# Patient Record
Sex: Female | Born: 1970 | Race: Asian | Hispanic: No | Marital: Married | Smoking: Never smoker
Health system: Southern US, Community
[De-identification: ages and names within clinical notes are randomized; demographics above are authoritative.]

## PROBLEM LIST (undated history)

## (undated) DIAGNOSIS — R0600 Dyspnea, unspecified: Secondary | ICD-10-CM

## (undated) DIAGNOSIS — R002 Palpitations: Secondary | ICD-10-CM

## (undated) DIAGNOSIS — F32A Depression, unspecified: Secondary | ICD-10-CM

## (undated) DIAGNOSIS — T7840XA Allergy, unspecified, initial encounter: Secondary | ICD-10-CM

## (undated) DIAGNOSIS — K769 Liver disease, unspecified: Secondary | ICD-10-CM

## (undated) DIAGNOSIS — K5140 Inflammatory polyps of colon without complications: Secondary | ICD-10-CM

## (undated) DIAGNOSIS — E039 Hypothyroidism, unspecified: Secondary | ICD-10-CM

## (undated) DIAGNOSIS — I10 Essential (primary) hypertension: Secondary | ICD-10-CM

## (undated) DIAGNOSIS — I728 Aneurysm of other specified arteries: Secondary | ICD-10-CM

## (undated) DIAGNOSIS — I77810 Thoracic aortic ectasia: Secondary | ICD-10-CM

## (undated) DIAGNOSIS — I499 Cardiac arrhythmia, unspecified: Secondary | ICD-10-CM

## (undated) DIAGNOSIS — R7303 Prediabetes: Secondary | ICD-10-CM

## (undated) DIAGNOSIS — E785 Hyperlipidemia, unspecified: Secondary | ICD-10-CM

## (undated) DIAGNOSIS — Z8632 Personal history of gestational diabetes: Secondary | ICD-10-CM

## (undated) DIAGNOSIS — F329 Major depressive disorder, single episode, unspecified: Secondary | ICD-10-CM

## (undated) DIAGNOSIS — G4733 Obstructive sleep apnea (adult) (pediatric): Secondary | ICD-10-CM

## (undated) HISTORY — DX: Hypothyroidism, unspecified: E03.9

## (undated) HISTORY — DX: Inflammatory polyps of colon without complications: K51.40

## (undated) HISTORY — DX: Palpitations: R00.2

## (undated) HISTORY — DX: Essential (primary) hypertension: I10

## (undated) HISTORY — DX: Depression, unspecified: F32.A

## (undated) HISTORY — DX: Liver disease, unspecified: K76.9

## (undated) HISTORY — DX: Major depressive disorder, single episode, unspecified: F32.9

## (undated) HISTORY — DX: Obstructive sleep apnea (adult) (pediatric): G47.33

## (undated) HISTORY — DX: Hyperlipidemia, unspecified: E78.5

## (undated) HISTORY — DX: Thoracic aortic ectasia: I77.810

## (undated) HISTORY — DX: Allergy, unspecified, initial encounter: T78.40XA

## (undated) HISTORY — DX: Aneurysm of other specified arteries: I72.8

## (undated) HISTORY — PX: FOOT SURGERY: SHX648

---
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
## 1996-05-18 HISTORY — PX: WISDOM TOOTH EXTRACTION: SHX21

## 2003-04-27 ENCOUNTER — Encounter: Admission: RE | Admit: 2003-04-27 | Discharge: 2003-04-27 | Payer: Self-pay | Admitting: Obstetrics and Gynecology

## 2003-07-08 ENCOUNTER — Inpatient Hospital Stay (HOSPITAL_COMMUNITY): Admission: AD | Admit: 2003-07-08 | Discharge: 2003-07-10 | Payer: Self-pay | Admitting: Obstetrics and Gynecology

## 2003-07-11 ENCOUNTER — Encounter: Admission: RE | Admit: 2003-07-11 | Discharge: 2003-08-10 | Payer: Self-pay | Admitting: Obstetrics and Gynecology

## 2003-12-25 ENCOUNTER — Other Ambulatory Visit: Admission: RE | Admit: 2003-12-25 | Discharge: 2003-12-25 | Payer: Self-pay | Admitting: Obstetrics and Gynecology

## 2005-02-03 ENCOUNTER — Other Ambulatory Visit: Admission: RE | Admit: 2005-02-03 | Discharge: 2005-02-03 | Payer: Self-pay | Admitting: Obstetrics and Gynecology

## 2005-08-16 ENCOUNTER — Inpatient Hospital Stay (HOSPITAL_COMMUNITY): Admission: AD | Admit: 2005-08-16 | Discharge: 2005-08-18 | Payer: Self-pay | Admitting: Obstetrics and Gynecology

## 2005-10-31 ENCOUNTER — Inpatient Hospital Stay (HOSPITAL_COMMUNITY): Admission: AD | Admit: 2005-10-31 | Discharge: 2005-10-31 | Payer: Self-pay | Admitting: Obstetrics and Gynecology

## 2006-02-10 ENCOUNTER — Other Ambulatory Visit: Admission: RE | Admit: 2006-02-10 | Discharge: 2006-02-10 | Payer: Self-pay | Admitting: Obstetrics and Gynecology

## 2008-09-25 ENCOUNTER — Emergency Department (HOSPITAL_COMMUNITY): Admission: EM | Admit: 2008-09-25 | Discharge: 2008-09-25 | Payer: Self-pay | Admitting: Emergency Medicine

## 2008-09-28 ENCOUNTER — Encounter (HOSPITAL_COMMUNITY): Admission: RE | Admit: 2008-09-28 | Discharge: 2008-12-27 | Payer: Self-pay | Admitting: Emergency Medicine

## 2009-05-24 ENCOUNTER — Inpatient Hospital Stay (HOSPITAL_COMMUNITY): Admission: AD | Admit: 2009-05-24 | Discharge: 2009-05-24 | Payer: Self-pay | Admitting: Obstetrics and Gynecology

## 2009-07-09 ENCOUNTER — Encounter: Admission: RE | Admit: 2009-07-09 | Discharge: 2009-07-09 | Payer: Self-pay | Admitting: Obstetrics and Gynecology

## 2009-08-16 ENCOUNTER — Inpatient Hospital Stay (HOSPITAL_COMMUNITY): Admission: AD | Admit: 2009-08-16 | Discharge: 2009-08-18 | Payer: Self-pay | Admitting: Obstetrics and Gynecology

## 2010-06-08 ENCOUNTER — Encounter: Payer: Self-pay | Admitting: Obstetrics and Gynecology

## 2010-08-03 LAB — WET PREP, GENITAL
Trich, Wet Prep: NONE SEEN
Yeast Wet Prep HPF POC: NONE SEEN

## 2010-08-03 LAB — GC/CHLAMYDIA PROBE AMP, GENITAL
Chlamydia, DNA Probe: NEGATIVE
GC Probe Amp, Genital: NEGATIVE

## 2010-08-06 LAB — CBC
Hemoglobin: 11.4 g/dL — ABNORMAL LOW (ref 12.0–15.0)
MCHC: 34 g/dL (ref 30.0–36.0)
MCHC: 34.7 g/dL (ref 30.0–36.0)
MCV: 92.7 fL (ref 78.0–100.0)
MCV: 93.5 fL (ref 78.0–100.0)
Platelets: 148 10*3/uL — ABNORMAL LOW (ref 150–400)
Platelets: 183 10*3/uL (ref 150–400)
RBC: 3.52 MIL/uL — ABNORMAL LOW (ref 3.87–5.11)
RDW: 14.1 % (ref 11.5–15.5)
WBC: 10.3 10*3/uL (ref 4.0–10.5)

## 2010-10-03 NOTE — H&P (Signed)
NAMEDORELLA, LASTER                           ACCOUNT NO.:  [id number]   MEDICAL RECORD NO.:  [id number]                   PATIENT TYPE:  INP   LOCATION:  9169                                 [facility]ILITY:  [facility]   PHYSICIAN:  Crist Fat. Rivard, M.D.              DATE OF BIRTH:  [birth year]   DATE OF ADMISSION:  07/08/2003  DATE OF DISCHARGE:                                HISTORY & PHYSICAL   REASON FOR ADMISSION:  The patient is a 40 year old Gravida II, Para 0-0-1-0  who presents at 43 and 4/7 weeks in early active labor.  EDD is July 04, 2003 by dates confirmed with ultrasound.  Her contractions began at 2300 on  July 07, 2003 and were irregular throughout the night.  Her contractions  have become strong and regular since 6 o'clock this a.m.  She reports  positive fetal movement, no bleeding and no ruptured of membranes.  No PIH  symptoms.  No headache or visual changes.  No epigastric pain.   PREGNANCY HISTORY:  Her pregnancy has been followed by the M.D. service at  [facility] and is remarkable for:  1. Transfer of care at 25 weeks from [place].  2. History of depression.  3. Gestational diabetes.  4. Positive Group B Strep; she is PENICILLIN allergic.  5. This patient was initially evaluated at the office of [facility] on March 22, 2003.  She transferred care from [place] at 25 weeks.  Her pregnancy has     been essentially unremarkable though complicated with sciatic pain for     which she was seen at [facility].  Some mild third trimester bleeding.     Cervical polyp noted.  Vaginal yeast infection for which she was     medicated with Terazol.  At 28 weeks one hour glucose challenge was     elevated.  Three hour GGT found values of 78, 187, 190 and 128 making the     patient a gestational diabetic.  She received nutritional counseling,     monitored blood sugars with diet and results of fasting blood sugar on     all two hour p.c.'s have been within normal limits.  She has been  size     equal to dates throughout, normotensive with no proteinuria.  On June 01, 2002 laboratory work results show a hemoglobin and hematocrit of 14.5     and 42.0, platelets 278,000.  Blood type and Rh are O positive. Antibody     screen negative.  VDRL was non-reactive.  Rubella was immune.  Hepatitis     B surface antigen was negative.  HIV was non-reactive.  Pap smear was     within normal limits.  Chlamydia negative.  CF testing declined.  Clot     screen declined.  As stated above at 28 weeks, one hour glucose challenge  was elevated.  Three hour GGT was elevated making the patient gestational     diabetic.  She has been diet controlled. At 36 weeks, culture of the     vaginal tract was positive for Group B Strep.   PAST MEDICAL HISTORY:  1. Acid reflux.  2. History of urinary tract infections.  3. History of depression for which she received short term treatment in the     past.  4. Sledding accident and broke her femur at age 23.  5. Wisdom teeth removed in 1999.  6. Gum graft surgery in 2004.   GENETIC HISTORY:  The patient's sisters have scoliosis, otherwise there is  no remarkable genetic history.   ALLERGIES:  1. PENICILLIN which causes a rash.  2. TOPICAL BENADRYL which causes a rash.   HABITS:  She denies the use of tobacco, alcohol or illicit drugs.   OB HISTORY:  In 2004 the patient had a first trimester SAB with  no  complications.   SOCIAL HISTORY:  The patient is a 40 year old Asian American female.  Her  husband, Alleya Demeter, is involved and supportive.  They are [place] in  their faith.   REVIEW OF SYMPTOMS:  There are no signs or symptoms suggestive of focal or  systemic disease.  The patient is typical of one with a uterine pregnancy at  term in early active labor.   PHYSICAL EXAMINATION:  VITAL SIGNS:  Stable and she is afebrile.  HEENT:  Unremarkable.  HEART:  Regular rate and rhythm.  LUNGS:  Clear.  ABDOMEN:  Gravid in its  contour.  The uterine fundus is noted to extend 38  cm above the level of the pubic symphysis.  Lay pulse finds the infant to be  in longitudinal lie and cephalic presentation.  The estimated fetal weight  is 7.5 pounds.  The baseline of the fetal heart rate monitor is 160 with  positive variability and positive accelerations.  The fetal heart rate is  reactive with occasional variables noted.  The patient is contracting  regularly every two to three minutes.  CERVICAL EXAM:  Her cervix is 4 cm dilated, 90% effaced with the cephalic  presenting part at a -1 station.  EXTREMITIES:  No pathologic edema.  Deep tendon reflexes are 1+ with no  clonus.   ASSESSMENT:  Intrauterine pregnancy at term, early active labor.   PLAN:  1. Admit per Dr. Dois Davenport Rivard.  2. May have epidural.  3. Begin clindamycin IV for positive Group B Strep prophylaxis.     Rica Koyanagi, C.N.M.               Crist Fat Rivard, M.D.    SDM/MEDQ  D:  07/08/2003  T:  07/08/2003  Job:  [id number]

## 2010-10-03 NOTE — H&P (Signed)
Ann Bass, Ann Bass               ACCOUNT NO.:  [id number]   MEDICAL RECORD NO.:  [id number]          PATIENT TYPE:  INP   LOCATION:  9111                          [facility]ILITY:  [facility]   PHYSICIAN:  Osborn Coho, M.D.   DATE OF BIRTH:  [birth year]   DATE OF ADMISSION:  08/16/2005  DATE OF DISCHARGE:                                HISTORY & PHYSICAL   HISTORY OF PRESENT ILLNESS:  This is a 40 year old, G3, P1-0-1-1 at 38-1/7  weeks who presents with contractions x1 hour.  She denies leaking, bleeding  and reports positive fetal movement.  Pregnancy has been followed by Dr.  Estanislado Pandy and is remarkable for unsure LMP, history of depression, history of  depression, history of gestational diabetes last pregnancy, Group B  Streptococcus positive with penicillin allergy.   PAST OBSTETRICAL HISTORY:  Vaginal delivery in 2005, of a female infant at  14 weeks' gestation weighing 7 pounds 6 ounces, remarkable for NICU stay  secondary to low 1 minute Apgar's at 3, 6 and 7.   PAST MEDICAL HISTORY:  1.  Diet-controlled gestational diabetes with first pregnancy.  2.  History of childhood varicella.  3.  History of reflux.  4.  History of depression for which she takes no medications.   PAST SURGICAL HISTORY:  1.  Wisdom teeth in 1999.  2.  Gum graft in 2004.   FAMILY HISTORY:  Remarkable for grandmother with ovarian cancer.   GENETIC HISTORY:  Unremarkable.   SOCIAL HISTORY:  The patient is married to [facility], who is involved and  supportive.  She is of [place] faith.  She works as a Proofreader.  She denies any alcohol, tobacco or drug use.   ALLERGIES:  PENICILLIN causes a rash.  BENADRYL causes a rash.   PRENATAL LABORATORY DATA:  Hemoglobin 14.4, platelets 293, blood type O  positive, antibody screen negative.  Sickle cell negative.  RPR nonreactive,  rubella immune.  Hepatitis negative, HIV negative.  Pap smear normal.  Gonorrhea negative.  Chlamydia negative.  Hemoglobin  electrophoresis normal.   PRENATAL COURSE:  The patient entered care at 10 weeks' gestation.  She had  an ultrasound in the first trimester which was normal.  She had a Glucola at  20 weeks which was 160.  She had another ultrasound at that time which was  normal.  A 3-hour GTT was done and had a single abnormal value.  She had  some sleep disturbance in mid pregnancy.  She had another 3-hour GTT at 29  weeks which was normal and ultrasound at 36 weeks which showed 45% growth  and normal AFI and was Group B Streptococcus positive at term.   PHYSICAL EXAMINATION:  VITAL SIGNS:  Stable.  Afebrile.  HEENT:  Within normal limits.  NECK:  Thyroid normal, not enlarged.  CHEST:  Clear to auscultation.  HEART:  Regular rate and rhythm.  ABDOMEN:  Gravid at 38 cm.  Vertex to [place].  EFM shows reactive fetal  heart rate with contractions every 2 minutes.  PELVIC:  Cervix is 5-6 cm per R.N. who checked patient prior  to calling me.   ASSESSMENT:  1.  Intrauterine pregnancy at 38-1/7 weeks.  2.  Active labor.  3.  Group B Streptococcus positive.  4.  Penicillin allergic.   PLAN:  1.  Admit to birthing suites per Dr. Su Hilt.  2.  Routine M.D. orders.  3.  Sensitivities were not done and we will therefore use vancomycin      prophylaxis.  4.  Epidural p.r.n.      Shakti L. Williams, C.N.M.      Osborn Coho, M.D.  Electronically Signed    MLW/MEDQ  D:  08/16/2005  T:  08/16/2005  Job:  [id number]

## 2011-07-08 ENCOUNTER — Other Ambulatory Visit: Payer: Self-pay | Admitting: Obstetrics and Gynecology

## 2011-07-08 DIAGNOSIS — Z1231 Encounter for screening mammogram for malignant neoplasm of breast: Secondary | ICD-10-CM

## 2011-07-13 ENCOUNTER — Ambulatory Visit
Admission: RE | Admit: 2011-07-13 | Discharge: 2011-07-13 | Disposition: A | Discharge status: Home / Self Care | Payer: BC Managed Care – PPO | Source: Ambulatory Visit | Attending: Obstetrics and Gynecology | Admitting: Obstetrics and Gynecology

## 2011-07-13 DIAGNOSIS — Z1231 Encounter for screening mammogram for malignant neoplasm of breast: Secondary | ICD-10-CM

## 2012-07-14 ENCOUNTER — Telehealth: Payer: Self-pay

## 2012-07-14 MED ORDER — DROSPIRENONE-ETHINYL ESTRADIOL 3-0.03 MG PO TABS: 1.0000 | ORAL_TABLET | Freq: Every day | ORAL | Status: DC

## 2012-07-14 NOTE — Telephone Encounter (Signed)
Received rx refill from [facility] pharmacy for ocella. TC to pt apt scheduled for aex 08/18/2012 with SR @ 9:50 rx sent to last until aex  Darien Ramus, CMA

## 2012-08-18 LAB — HM PAP SMEAR: HM Pap smear: NORMAL

## 2012-09-29 ENCOUNTER — Other Ambulatory Visit: Payer: Self-pay

## 2012-09-29 DIAGNOSIS — Z1231 Encounter for screening mammogram for malignant neoplasm of breast: Secondary | ICD-10-CM

## 2012-11-02 ENCOUNTER — Ambulatory Visit
Admission: RE | Admit: 2012-11-02 | Discharge: 2012-11-02 | Disposition: A | Discharge status: Home / Self Care | Payer: BC Managed Care – PPO | Source: Ambulatory Visit

## 2012-11-02 DIAGNOSIS — Z1231 Encounter for screening mammogram for malignant neoplasm of breast: Secondary | ICD-10-CM

## 2013-02-21 ENCOUNTER — Telehealth: Payer: Self-pay

## 2013-02-21 NOTE — Telephone Encounter (Signed)
LM for CB  HM reviewed. Needs HM information and any records available Due as noted:

## 2013-02-22 ENCOUNTER — Encounter: Payer: Self-pay | Admitting: Family Medicine

## 2013-02-22 ENCOUNTER — Ambulatory Visit (INDEPENDENT_AMBULATORY_CARE_PROVIDER_SITE_OTHER): Payer: BC Managed Care – PPO | Admitting: Family Medicine

## 2013-02-22 VITALS — BP 126/80 | HR 79 | Temp 98.0°F | Resp 16 | Ht 62.0 in | Wt 138.1 lb

## 2013-02-22 DIAGNOSIS — E039 Hypothyroidism, unspecified: Secondary | ICD-10-CM | POA: Insufficient documentation

## 2013-02-22 DIAGNOSIS — R7302 Impaired glucose tolerance (oral): Secondary | ICD-10-CM | POA: Insufficient documentation

## 2013-02-22 DIAGNOSIS — F32A Depression, unspecified: Secondary | ICD-10-CM | POA: Insufficient documentation

## 2013-02-22 DIAGNOSIS — R7309 Other abnormal glucose: Secondary | ICD-10-CM

## 2013-02-22 DIAGNOSIS — F3289 Other specified depressive episodes: Secondary | ICD-10-CM

## 2013-02-22 DIAGNOSIS — R5381 Other malaise: Secondary | ICD-10-CM | POA: Insufficient documentation

## 2013-02-22 DIAGNOSIS — E049 Nontoxic goiter, unspecified: Secondary | ICD-10-CM

## 2013-02-22 DIAGNOSIS — E010 Iodine-deficiency related diffuse (endemic) goiter: Secondary | ICD-10-CM

## 2013-02-22 DIAGNOSIS — F329 Major depressive disorder, single episode, unspecified: Secondary | ICD-10-CM | POA: Insufficient documentation

## 2013-02-22 NOTE — Progress Notes (Signed)
  Subjective:    Patient ID: Ann Bass, female    DOB: Mar 01, 1971, 42 y.o.   MRN: [id number]  HPI New to establish.  Previous MD- [place] ([facility])  GYN- Rivard  Depression- on Zoloft, feels this is working 'really well'.  Denies tearfulness.  Admits to quick to anger.  Not sleeping well- waking frequently.  Not withdrawing from people or activities.  Glucose intolerance- pt reports she was 'borderline' for all 3 pregnancies.  Mom has diabetes.  Pt reports she has 'sensitivities to certain foods'- pt notes that unopposed carb intake will result in a 'crash' by mid morning/afternoon/evening.  Fatigue- this is what prompted pt to see integrative medicine.  Wakes in the morning 'already thinking about going back to bed'.  'can't make it through the day w/out lying down'.  Will turn down plans due to fatigue.  Had thyroid US and blood work yesterday.   Review of Systems For ROS see HPI     Objective:   Physical Exam  Constitutional: She is oriented to person, place, and time. She appears well-developed and well-nourished. No distress.  HENT:  Head: Normocephalic and atraumatic.  Eyes: Conjunctivae and EOM are normal. Pupils are equal, round, and reactive to light.  Neck: Normal range of motion. Neck supple. Thyromegaly present.  Cardiovascular: Normal rate, regular rhythm, normal heart sounds and intact distal pulses.   No murmur heard. Pulmonary/Chest: Effort normal and breath sounds normal. No respiratory distress.  Abdominal: Soft. She exhibits no distension. There is no tenderness.  Musculoskeletal: She exhibits no edema.  Lymphadenopathy:    She has no cervical adenopathy.  Neurological: She is alert and oriented to person, place, and time.  Skin: Skin is warm and dry.  Psychiatric: She has a normal mood and affect. Her behavior is normal.          Assessment & Plan:

## 2013-02-22 NOTE — Telephone Encounter (Signed)
Unable to reach prior to visit  

## 2013-02-22 NOTE — Patient Instructions (Signed)
Schedule your complete physical in 6 months We'll notify you of your lab results and make any changes if needed If all labs come back normal, we may want to think about switching the Zoloft Call with any questions or concerns Please send copies of labs and thyroid US Think of [place] as your home base! Happy [place] Birthday!

## 2013-02-23 LAB — HEMOGLOBIN A1C: Hgb A1c MFr Bld: 5.6 % (ref 4.6–6.5)

## 2013-02-26 NOTE — Assessment & Plan Note (Signed)
New to provider.  Ongoing for pt.  Feels Zoloft is controlling sxs.  Discussed that Zoloft may be contributing to pt's ongoing fatigue.  Not interested in making changes at this time.

## 2013-02-26 NOTE — Assessment & Plan Note (Signed)
New.  Pt would like to have A1C done due to her hx of glucose intolerance w/ all 3 pregnancies.  Reviewed importance of low carb diet and regular exercise.  Will follow.

## 2013-02-26 NOTE — Assessment & Plan Note (Signed)
New to provider, ongoing for pt.  Reports she is seeing [facility] for this and had lab work done.  Asked for copies.  Discussed that her Zoloft may be contributing to sxs.  Will follow.

## 2013-02-26 NOTE — Assessment & Plan Note (Signed)
New to provider.  Pt reports she had thyroid US and lab work done yesterday.  Encouraged her to fax copy for review and to be included in her chart.  Will follow.

## 2013-08-16 LAB — HM PAP SMEAR: HM Pap smear: NORMAL

## 2013-12-16 LAB — HM MAMMOGRAPHY: HM Mammogram: NORMAL

## 2013-12-20 ENCOUNTER — Other Ambulatory Visit: Payer: Self-pay

## 2013-12-20 DIAGNOSIS — Z1231 Encounter for screening mammogram for malignant neoplasm of breast: Secondary | ICD-10-CM

## 2013-12-26 ENCOUNTER — Ambulatory Visit
Admission: RE | Admit: 2013-12-26 | Discharge: 2013-12-26 | Disposition: A | Discharge status: Home / Self Care | Payer: BC Managed Care – PPO | Source: Ambulatory Visit

## 2013-12-26 DIAGNOSIS — Z1231 Encounter for screening mammogram for malignant neoplasm of breast: Secondary | ICD-10-CM

## 2014-05-25 ENCOUNTER — Ambulatory Visit (INDEPENDENT_AMBULATORY_CARE_PROVIDER_SITE_OTHER): Payer: BLUE CROSS/BLUE SHIELD | Admitting: Family Medicine

## 2014-05-25 ENCOUNTER — Ambulatory Visit (INDEPENDENT_AMBULATORY_CARE_PROVIDER_SITE_OTHER): Payer: BLUE CROSS/BLUE SHIELD | Admitting: General Practice

## 2014-05-25 ENCOUNTER — Encounter: Payer: Self-pay | Admitting: Family Medicine

## 2014-05-25 VITALS — BP 124/84 | HR 83 | Temp 98.1°F | Resp 16 | Wt 138.2 lb

## 2014-05-25 DIAGNOSIS — F329 Major depressive disorder, single episode, unspecified: Secondary | ICD-10-CM

## 2014-05-25 DIAGNOSIS — F32A Depression, unspecified: Secondary | ICD-10-CM

## 2014-05-25 DIAGNOSIS — Z23 Encounter for immunization: Secondary | ICD-10-CM

## 2014-05-25 MED ORDER — SERTRALINE HCL 50 MG PO TABS: 50.0000 mg | ORAL_TABLET | Freq: Every day | ORAL | Status: DC

## 2014-05-25 NOTE — Progress Notes (Signed)
Pre visit review using our clinic review tool, if applicable. No additional management support is needed unless otherwise documented below in the visit note. 

## 2014-05-25 NOTE — Progress Notes (Signed)
   Subjective:    Patient ID: Ann Bass, female    DOB: Jul 26, 1970, 44 y.o.   MRN: [id number]  HPI Depression- chronic problem.  Pt attempted to wean off Zoloft but 'that didn't go so well'.  Decreased to 25mg  and then started skipping days but 'i just didn't feel well at all'.  Now on 25mg  daily.  Pt interested in returning to 50mg  daily.  Pt had dizziness w/ weaning.  Sleeps better on meds, mind is more calm.  Pt had some tearfulness and feeling on edge when attempting to wean.   Review of Systems For ROS see HPI     Objective:   Physical Exam  Constitutional: She is oriented to person, place, and time. She appears well-developed and well-nourished. No distress.  HENT:  Head: Normocephalic and atraumatic.  Neck: Thyromegaly present.  Cardiovascular: Normal rate, regular rhythm, normal heart sounds and intact distal pulses.   Pulmonary/Chest: Effort normal and breath sounds normal. No respiratory distress. She has no wheezes. She has no rales.  Lymphadenopathy:    She has no cervical adenopathy.  Neurological: She is alert and oriented to person, place, and time. No cranial nerve deficit. Coordination normal.  Skin: Skin is warm and dry.  Psychiatric: She has a normal mood and affect. Her behavior is normal. Thought content normal.  Vitals reviewed.         Assessment & Plan:

## 2014-05-25 NOTE — Assessment & Plan Note (Signed)
Chronic problem for pt.  Attempted to wean off Zoloft but not only had withdrawal sxs but had worsening of depression/anxiety.  Resume Zoloft 50mg  daily.  Script provided.

## 2014-05-25 NOTE — Patient Instructions (Signed)
Follow up as scheduled for your physical Increase back to the 50mg  of Zoloft daily Please send me a copy of your thyroid US report Call with any questions or concerns Happy New Year!!!

## 2014-08-22 ENCOUNTER — Telehealth: Payer: Self-pay | Admitting: Family Medicine

## 2014-08-22 NOTE — Telephone Encounter (Signed)
Pre Visit letter sent  °

## 2014-09-11 LAB — HM PAP SMEAR: HM Pap smear: NORMAL

## 2014-09-12 ENCOUNTER — Telehealth: Payer: Self-pay | Admitting: *Deleted

## 2014-09-12 NOTE — Telephone Encounter (Signed)
Unable to reach patient at time of Pre-Visit Call.  Left message for patient to return call when available.    

## 2014-09-13 ENCOUNTER — Ambulatory Visit (INDEPENDENT_AMBULATORY_CARE_PROVIDER_SITE_OTHER): Payer: BLUE CROSS/BLUE SHIELD | Admitting: Family Medicine

## 2014-09-13 ENCOUNTER — Encounter: Payer: Self-pay | Admitting: Family Medicine

## 2014-09-13 VITALS — BP 122/80 | HR 75 | Temp 98.2°F | Resp 16 | Ht 63.5 in | Wt 140.2 lb

## 2014-09-13 DIAGNOSIS — R002 Palpitations: Secondary | ICD-10-CM

## 2014-09-13 DIAGNOSIS — Z0000 Encounter for general adult medical examination without abnormal findings: Secondary | ICD-10-CM

## 2014-09-13 DIAGNOSIS — H9193 Unspecified hearing loss, bilateral: Secondary | ICD-10-CM

## 2014-09-13 DIAGNOSIS — Z23 Encounter for immunization: Secondary | ICD-10-CM | POA: Diagnosis not present

## 2014-09-13 LAB — LIPID PANEL
CHOLESTEROL: 190 mg/dL (ref 0–200)
HDL: 44.2 mg/dL (ref >39.00)
LDL Cholesterol: 114 mg/dL — ABNORMAL HIGH (ref 0–99)
NonHDL: 145.8
TRIGLYCERIDES: 160 mg/dL — AB (ref 0.0–149.0)
Total CHOL/HDL Ratio: 4
VLDL: 32 mg/dL (ref 0.0–40.0)

## 2014-09-13 LAB — CBC WITH DIFFERENTIAL/PLATELET
BASOS ABS: 0 10*3/uL (ref 0.0–0.1)
Basophils Relative: 0.5 % (ref 0.0–3.0)
Eosinophils Absolute: 0.4 10*3/uL (ref 0.0–0.7)
Eosinophils Relative: 6.1 % — ABNORMAL HIGH (ref 0.0–5.0)
HEMATOCRIT: 43.8 % (ref 36.0–46.0)
HEMOGLOBIN: 15.1 g/dL — AB (ref 12.0–15.0)
LYMPHS ABS: 1.5 10*3/uL (ref 0.7–4.0)
Lymphocytes Relative: 24.1 % (ref 12.0–46.0)
MCHC: 34.3 g/dL (ref 30.0–36.0)
MCV: 86.9 fl (ref 78.0–100.0)
MONO ABS: 0.4 10*3/uL (ref 0.1–1.0)
Monocytes Relative: 5.9 % (ref 3.0–12.0)
NEUTROS ABS: 4.1 10*3/uL (ref 1.4–7.7)
Neutrophils Relative %: 63.4 % (ref 43.0–77.0)
Platelets: 279 10*3/uL (ref 150.0–400.0)
RBC: 5.04 Mil/uL (ref 3.87–5.11)
RDW: 13 % (ref 11.5–15.5)
WBC: 6.4 10*3/uL (ref 4.0–10.5)

## 2014-09-13 LAB — BASIC METABOLIC PANEL
BUN: 12 mg/dL (ref 6–23)
CO2: 26 meq/L (ref 19–32)
Calcium: 9.6 mg/dL (ref 8.4–10.5)
Chloride: 103 mEq/L (ref 96–112)
Creatinine, Ser: 0.6 mg/dL (ref 0.40–1.20)
GFR: 115.65 mL/min (ref >60.00)
Glucose, Bld: 102 mg/dL — ABNORMAL HIGH (ref 70–99)
Potassium: 4 mEq/L (ref 3.5–5.1)
SODIUM: 136 meq/L (ref 135–145)

## 2014-09-13 LAB — HEPATIC FUNCTION PANEL
ALT: 16 U/L (ref 0–35)
AST: 18 U/L (ref 0–37)
Albumin: 4.1 g/dL (ref 3.5–5.2)
Alkaline Phosphatase: 57 U/L (ref 39–117)
BILIRUBIN DIRECT: 0.1 mg/dL (ref 0.0–0.3)
BILIRUBIN TOTAL: 0.5 mg/dL (ref 0.2–1.2)
Total Protein: 6.5 g/dL (ref 6.0–8.3)

## 2014-09-13 LAB — TSH: TSH: 0.56 u[IU]/mL (ref 0.35–4.50)

## 2014-09-13 LAB — VITAMIN D 25 HYDROXY (VIT D DEFICIENCY, FRACTURES): VITD: 25.31 ng/mL — ABNORMAL LOW (ref 30.00–100.00)

## 2014-09-13 NOTE — Assessment & Plan Note (Signed)
New.  Refer to audiology for complete evaluation and tx

## 2014-09-13 NOTE — Patient Instructions (Signed)
Follow up in 1 year or as needed We'll notify you of your lab results and make any changes if needed Try and limit your caffeine intake as this can worsen palpitations Switch your allergy medication as your symptoms started in conjunction with the Allegra If no relief w/ decreasing caffeine or adjusting allergy meds- call me so we can refer you to cardiology Call with any questions or concerns Happy Spring!!!

## 2014-09-13 NOTE — Assessment & Plan Note (Signed)
Pt's PE WNL w/ exception of known thyromegaly.  UTD on GYN.  Check labs.  Anticipatory guidance provided.

## 2014-09-13 NOTE — Progress Notes (Signed)
Pre visit review using our clinic review tool, if applicable. No additional management support is needed unless otherwise documented below in the visit note. 

## 2014-09-13 NOTE — Progress Notes (Signed)
   Subjective:    Patient ID: Ann Bass, female    DOB: 09/26/1970, 44 y.o.   MRN: [id number]  HPI CPE- UTD on GYN, mammo (Rivard).  Seeing Homeopathic doctor q4 months- Dr Judeen Hammans   Review of Systems Patient reports no vision changes, adenopathy,fever, weight change,  persistant/recurrent hoarseness , swallowing issues, chest pain, palpitations, edema, persistant/recurrent cough, hemoptysis, dyspnea (rest/exertional/paroxysmal nocturnal), gastrointestinal bleeding (melena, rectal bleeding), abdominal pain, significant heartburn, bowel changes, GU symptoms (dysuria, hematuria, incontinence), Gyn symptoms (abnormal  bleeding, pain),  syncope, focal weakness, memory loss, numbness & tingling, skin/hair/nail changes, abnormal bruising or bleeding, anxiety, or depression.  + decreased hearing- 'a long time.  I've been avoiding doing anything about it'.  Having difficulty hearing conversational tones, worse in crowds.  + palpitations- 'very random'.  Pt will have short lived, 'very rapid' heart beats.  Will last for 1-2 seconds.  Denies CP.  Some associated SOB.  'it almost feels like an adrenaline rush'.  Occuring 1-2x/day.  No family hx of arrhythmias or cardiac issues.  Pt reports gradual up tick in caffeine intake, sxs correspond w/ starting Allegra.     Objective:   Physical Exam General Appearance:    Alert, cooperative, no distress, appears stated age  Head:    Normocephalic, without obvious abnormality, atraumatic  Eyes:    PERRL, conjunctiva/corneas clear, EOM's intact, fundi    benign, both eyes  Ears:    Normal TM's and external ear canals, both ears  Nose:   Nares normal, septum midline, mucosa normal, no drainage    or sinus tenderness  Throat:   Lips, mucosa, and tongue normal; teeth and gums normal  Neck:   Supple, symmetrical, trachea midline, no adenopathy;    Thyroid: thyromegaly  Back:     Symmetric, no curvature, ROM normal, no CVA tenderness  Lungs:     Clear to  auscultation bilaterally, respirations unlabored  Chest Wall:    No tenderness or deformity   Heart:    Regular rate and rhythm, S1 and S2 normal, no murmur, rub   or gallop  Breast Exam:    Deferred to GYN  Abdomen:     Soft, non-tender, bowel sounds active all four quadrants,    no masses, no organomegaly  Genitalia:    Deferred to GYN  Rectal:    Extremities:   Extremities normal, atraumatic, no cyanosis or edema  Pulses:   2+ and symmetric all extremities  Skin:   Skin color, texture, turgor normal, no rashes or lesions  Lymph nodes:   Cervical, supraclavicular, and axillary nodes normal  Neurologic:   CNII-XII intact, normal strength, sensation and reflexes    throughout          Assessment & Plan:

## 2014-09-13 NOTE — Assessment & Plan Note (Signed)
New.  EKG WNL.  Check TSH and CBC to r/o underlying cause.  Suspect this is due to increased caffeine intake and antihistamine use.  Pt to adjust her allergy medication and limit caffeine.  If still symptomatic, will refer to cardiology.

## 2014-09-18 ENCOUNTER — Encounter: Payer: Self-pay | Admitting: General Practice

## 2014-10-09 ENCOUNTER — Ambulatory Visit: Payer: BLUE CROSS/BLUE SHIELD | Attending: Audiology | Admitting: Audiology

## 2014-10-09 DIAGNOSIS — Z822 Family history of deafness and hearing loss: Secondary | ICD-10-CM | POA: Insufficient documentation

## 2014-10-09 DIAGNOSIS — H93291 Other abnormal auditory perceptions, right ear: Secondary | ICD-10-CM | POA: Diagnosis not present

## 2014-10-09 DIAGNOSIS — H906 Mixed conductive and sensorineural hearing loss, bilateral: Secondary | ICD-10-CM | POA: Diagnosis not present

## 2014-10-09 DIAGNOSIS — H9193 Unspecified hearing loss, bilateral: Secondary | ICD-10-CM | POA: Diagnosis not present

## 2014-10-09 NOTE — Patient Instructions (Signed)
1.   Referral ENT.   Dr. Gwyndolyn Saxon McGuirt is in [place] or [place] [facility]. 2.  Musician's plugs, are available from [employer] for music related hearing protection because there is no distortion.  Other hearing protection, such as sponge plugs (available at pharmacies) or earmuffs (available at [employer] or [employer]) are useful for noisy activities and venues. 3.       Amplification helps make the signal louder and therefore often improves hearing and word recognition.  Amplification has many forms including hearing aids in one or both ears, an assistive listening device which have a microphone and speaker such as a small handheld device and/or even a surround sound system of speakers.  Amplification may be covered by some insurances, but not all.  It is important to note that hearing aids must be individually fit according to the hearing test results and the ear shape.  Audiologists and hearing aid dealers in [place] must be licensed in order to dispense hearing aids.  In addition, a trial period is mandated by law in our state because often amplification must be tried and then evaluated in order to determine benefit.       There are many excellent choices when it comes to amplification in our area and providers are listed in the phone book under hearing aids, may be affiliated with [facility] physicians, are located at [place] as well as the [employer] [facility]. 4.

## 2014-10-09 NOTE — Procedures (Signed)
Outpatient Rehabilitation and [facility] [address] [place], [place] [address] [place] EVALUATION Name: Ann Bass DOB:  Oct 06, 1970 MRN:  [id number]                                Diagnosis: Hearing loss Date: 10/09/2014    Referent: Ann Asa, MD  HISTORY: Ann Bass, age 44 y.o. years, was seen for an audiological evaluation and reports a "gradual hearing loss over the past 5 years". The hearing loss is adversely affecting her socially because she is experiencing difficulty understanding in "crowds, anywhere where there is background noise and higher pitched voices".   Ann Bass denies tinnitus. She reports a life long "stumbling" with no recent change".   Ann Bass states that her "father developed hearing loss in his 20's" and needs "hearing aids".         EVALUATION: Pure tone air and tone conduction was completed using conventional audiometry with inserts. Hearing thresholds are 10-20 from 250Hz  - 500Hz ; 20-25 dBHL at 1000Hz ; 30-35 dBHL at 2000Hz ; 45-50 dBHL at 4000Hz  and 50-55 dBHL at 8000Hz  bilaterally. The hearing loss is sensorineural except for a bilateral conductive component at 1000Hz  only. Speech reception thresholds are 20 dBHL in the right ear and 25 dBHL in the left ear using recorded spondee words.  The reliability is good. Word recognition is 96% at 60dBHL bilaterally using recorded NU-6 word lists in quiet. In minimal background noise with +5dB signal to noise ratio word recognition is 60 % in the right ear and 80% in the left ear. Otoscopic inspection reveals clear ear canals with visible tympanic membranes without redness. Tympanometry showed normal volume, pressure and compliance bilaterally (Type A).  Ipsilateral acoustic reflexes are present bilaterally and range from normal to slightly elevated which is agrees with this degree of hearing loss.  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   CONCLUSION:      Ann Bass has symmetrical hearing thresholds that are slightly poorer on the left side.  She has normal low frequency hearing thresholds gently sloping to a moderately severe high frequency hearing loss bilaterally.  The hearing loss is primarily sensorineural with a conductive component at 1000Hz  only bilaterally. She has excellent word recognition in quiet.  In minimal background noise word recognition drops to poor on the right side but remains good on the left side.  The right ear word recognition in background noise must be closely monitored and a repeat audiological evaluation in 3-6 months is recommended.  Ann Bass is "very social" - she should be an excellent candidate for amplification therefore a hearing aid evaluation is recommended. Please note that the auditory processing screening test was negative using dichotic digits  and was only slightly positive with one error on the left side with competing sentences.  The communication difficulty that Ann Bass is experiencing appear primarily hearing loss related at this time.  RECOMMENDATIONS: 1.  Referral to an ENT (i.e.Dr. Royce Bass is in [place], there are also [employer] and [employer]). 2.  Protect hearing from loud sounds to minimize future hearing loss. Musician's plugs, are available from [employer] for music related hearing protection because there is no distortion.  Other hearing protection, such as sponge plugs (available at pharmacies) or earmuffs (available at [employer] or [employer]) are useful  for noisy activities and venues. 3.   Once medical clearance is obtained - a hearing aid evaluation.  Amplification helps make the signal louder and therefore often improves hearing and word recognition.  Amplification has many forms including hearing aids in one or both ears, an assistive listening  device which have a microphone and speaker such as a small handheld device and/or even a surround sound system of speakers.  Amplification may be covered by some insurances, but not all.  It is important to note that hearing aids must be individually fit according to the hearing test results and the ear shape.  Audiologists and hearing aid dealers in [place] must be licensed in order to dispense hearing aids.  In addition, a trial period is mandated by law in our state because often amplification must be tried and then evaluated in order to determine benefit.       There are many excellent choices when it comes to amplification in our area and providers are listed in the phone book under hearing aids, may be affiliated with Ear, [place] and Throat physicians, are located at [place] as well as the [facility]. 4.  Monitor hearing closely at home and with a repeat hearing evaluation in 6-12 months to monitor hearing thresholds and word recognition in background noise, here or where Ann Bass obtains the hearing aids. 5. Strategies that help improve hearing include: A) Face the speaker directly. Optimal is having the speakers face well - lit.  Unless amplified, being within 3-6 feet of the speaker will enhance word recognition. B) Avoid having the speaker back-lit as this will minimize the ability to use cues from lip-reading, facial expression and gestures. C)  Word recognition is poorer in background noise. For optimal word recognition, turn off the TV, radio or noisy fan when engaging in conversation. In a restaurant, try to sit away from noise sources and close to the primary speaker.  D)  Ask for topic clarification from time to time in order to remain in the conversation.  Most people don't mind repeating or clarifying a point when asked.  If needed, explain the difficulty hearing in background noise or hearing loss. 6.  Please be aware that there are  captioned telephone devices may help for difficulty hearing on the telephone and are available for those with documented hearing loss - such as www.captionedtelephone.com or www.https://www.eaton-velez.com/.   Deborah L. Heide Spark Au.D., CCC-A Doctor of Audiology  10/09/2014  cc: Ann Asa, MD

## 2014-11-06 ENCOUNTER — Telehealth: Payer: Self-pay | Admitting: Family Medicine

## 2014-11-06 NOTE — Telephone Encounter (Signed)
Patient Name: Ann Bass  DOB: 15-Feb-1971    Initial Comment Caller states, she has heart palpations , last night she woke up gasping , she has very shallow breathing while sleeping    Nurse Assessment  Nurse: Leilani Merl, RN, Nira Conn Date/Time (Eastern Time): 11/06/2014 2:04:30 PM  Confirm and document reason for call. If symptomatic, describe symptoms. ---Caller states, she has heart palpations off and on for about 4 months , last night she woke up gasping , she has very shallow breathing while sleeping. Caller states that she feels tired in the mornings for the last 2 weeks  Has the patient traveled out of the country within the last 30 days? ---Not Applicable  Does the patient require triage? ---Yes  Related visit to physician within the last 2 weeks? ---No  Does the PT have any chronic conditions? (i.e. diabetes, asthma, etc.) ---Yes  List chronic conditions. ---glucose intolerance, allergies  Did the patient indicate they were pregnant? ---No     Guidelines    Guideline Title Affirmed Question Affirmed Notes  Chest Pain Dizziness or lightheadedness    Final Disposition User   Go to ED Now [place], RN, [employer]

## 2014-11-06 NOTE — Telephone Encounter (Signed)
Patient called in stating that she was having heart palpitations and waking up gasping for air. Call transferred to [facility]

## 2014-11-07 NOTE — Telephone Encounter (Signed)
Pt states she did not go to ER.  She spoke to the triage nurse and her Homeopath yesterday and is following their instructions.  She states that she feels better today and declined an appointment.  She knows to call back with further concerns.

## 2015-06-08 ENCOUNTER — Other Ambulatory Visit: Payer: Self-pay | Admitting: Family Medicine

## 2015-06-10 NOTE — Telephone Encounter (Signed)
Medication filled to pharmacy as requested.   

## 2015-09-16 ENCOUNTER — Ambulatory Visit (INDEPENDENT_AMBULATORY_CARE_PROVIDER_SITE_OTHER): Payer: BLUE CROSS/BLUE SHIELD | Admitting: Family Medicine

## 2015-09-16 ENCOUNTER — Encounter: Payer: Self-pay | Admitting: Family Medicine

## 2015-09-16 ENCOUNTER — Other Ambulatory Visit: Payer: Self-pay | Admitting: General Practice

## 2015-09-16 VITALS — BP 120/80 | HR 62 | Temp 98.0°F | Resp 16 | Ht 64.0 in | Wt 140.5 lb

## 2015-09-16 DIAGNOSIS — Z0000 Encounter for general adult medical examination without abnormal findings: Secondary | ICD-10-CM | POA: Diagnosis not present

## 2015-09-16 LAB — HEPATIC FUNCTION PANEL
ALBUMIN: 3.9 g/dL (ref 3.5–5.2)
ALT: 13 U/L (ref 0–35)
AST: 14 U/L (ref 0–37)
Alkaline Phosphatase: 41 U/L (ref 39–117)
Bilirubin, Direct: 0.1 mg/dL (ref 0.0–0.3)
Total Bilirubin: 0.5 mg/dL (ref 0.2–1.2)
Total Protein: 6.4 g/dL (ref 6.0–8.3)

## 2015-09-16 LAB — LIPID PANEL
Cholesterol: 190 mg/dL (ref 0–200)
HDL: 41.4 mg/dL (ref >39.00)
LDL CALC: 132 mg/dL — AB (ref 0–99)
NonHDL: 148.54
TRIGLYCERIDES: 81 mg/dL (ref 0.0–149.0)
Total CHOL/HDL Ratio: 5
VLDL: 16.2 mg/dL (ref 0.0–40.0)

## 2015-09-16 LAB — CBC WITH DIFFERENTIAL/PLATELET
BASOS ABS: 0 10*3/uL (ref 0.0–0.1)
Basophils Relative: 0.6 % (ref 0.0–3.0)
Eosinophils Absolute: 0.3 10*3/uL (ref 0.0–0.7)
Eosinophils Relative: 5.7 % — ABNORMAL HIGH (ref 0.0–5.0)
HCT: 42.8 % (ref 36.0–46.0)
Hemoglobin: 14.2 g/dL (ref 12.0–15.0)
Lymphocytes Relative: 26 % (ref 12.0–46.0)
Lymphs Abs: 1.4 10*3/uL (ref 0.7–4.0)
MCHC: 33.2 g/dL (ref 30.0–36.0)
MCV: 86.7 fl (ref 78.0–100.0)
MONOS PCT: 5.5 % (ref 3.0–12.0)
Monocytes Absolute: 0.3 10*3/uL (ref 0.1–1.0)
Neutro Abs: 3.3 10*3/uL (ref 1.4–7.7)
Neutrophils Relative %: 62.2 % (ref 43.0–77.0)
Platelets: 261 10*3/uL (ref 150.0–400.0)
RBC: 4.94 Mil/uL (ref 3.87–5.11)
RDW: 13.9 % (ref 11.5–15.5)
WBC: 5.3 10*3/uL (ref 4.0–10.5)

## 2015-09-16 LAB — BASIC METABOLIC PANEL
BUN: 13 mg/dL (ref 6–23)
CALCIUM: 9.2 mg/dL (ref 8.4–10.5)
CO2: 27 mEq/L (ref 19–32)
CREATININE: 0.58 mg/dL (ref 0.40–1.20)
Chloride: 106 mEq/L (ref 96–112)
GFR: 119.7 mL/min (ref >60.00)
GLUCOSE: 83 mg/dL (ref 70–99)
Potassium: 3.9 mEq/L (ref 3.5–5.1)
Sodium: 140 mEq/L (ref 135–145)

## 2015-09-16 LAB — VITAMIN D 25 HYDROXY (VIT D DEFICIENCY, FRACTURES): VITD: 55.88 ng/mL (ref 30.00–100.00)

## 2015-09-16 LAB — TSH: TSH: 0.93 u[IU]/mL (ref 0.35–4.50)

## 2015-09-16 MED ORDER — CITALOPRAM HYDROBROMIDE 20 MG PO TABS: 20.0000 mg | ORAL_TABLET | Freq: Every day | ORAL | Status: DC

## 2015-09-16 NOTE — Assessment & Plan Note (Signed)
Pt's PE WNL w/ exception of known thyromegaly.  Due for mammo- this is scheduled.  UTD on GYN.  Check labs.  Anticipatory guidance provided.

## 2015-09-16 NOTE — Progress Notes (Signed)
   Subjective:    Patient ID: Ann Bass, female    DOB: [birth year], 45 y.o.   MRN: [id number]  HPI CPE- UTD on GYN, has mammo scheduled.   Review of Systems Patient reports no vision/ hearing changes, adenopathy,fever, weight change,  persistant/recurrent hoarseness , swallowing issues, chest pain, palpitations, edema, persistant/recurrent cough, hemoptysis, dyspnea (rest/exertional/paroxysmal nocturnal), gastrointestinal bleeding (melena, rectal bleeding), abdominal pain, significant heartburn, bowel changes, GU symptoms (dysuria, hematuria, incontinence), Gyn symptoms (abnormal  bleeding, pain),  syncope, focal weakness, memory loss, numbness & tingling, skin/hair/nail changes, abnormal bruising or bleeding, anxiety, or depression.   + fatigue, brain fog    Objective:   Physical Exam General Appearance:    Alert, cooperative, no distress, appears stated age  Head:    Normocephalic, without obvious abnormality, atraumatic  Eyes:    PERRL, conjunctiva/corneas clear, EOM's intact, fundi    benign, both eyes  Ears:    Normal TM's and external ear canals, both ears  Nose:   Nares normal, septum midline, mucosa normal, no drainage    or sinus tenderness  Throat:   Lips, mucosa, and tongue normal; teeth and gums normal  Neck:   Supple, symmetrical, trachea midline, no adenopathy;    Thyroid: thyromegaly  Back:     Symmetric, no curvature, ROM normal, no CVA tenderness  Lungs:     Clear to auscultation bilaterally, respirations unlabored  Chest Wall:    No tenderness or deformity   Heart:    Regular rate and rhythm, S1 and S2 normal, no murmur, rub   or gallop  Breast Exam:    Deferred to GYN  Abdomen:     Soft, non-tender, bowel sounds active all four quadrants,    no masses, no organomegaly  Genitalia:    Deferred to GYN  Rectal:    Extremities:   Extremities normal, atraumatic, no cyanosis or edema  Pulses:   2+ and symmetric all extremities  Skin:   Skin color, texture, turgor  normal, no rashes or lesions  Lymph nodes:   Cervical, supraclavicular, and axillary nodes normal  Neurologic:   CNII-XII intact, normal strength, sensation and reflexes    throughout          Assessment & Plan:

## 2015-09-16 NOTE — Patient Instructions (Signed)
Follow up in 1 year or as needed We'll notify you of your lab results and make any changes if needed If all of the labs look good, we'll plan on switching the Zoloft to something like Celexa Continue to work on healthy diet and regular exercise- you look great! Call with any questions or concerns Thanks for sticking with [place]! Happy Spring!!!

## 2015-09-16 NOTE — Progress Notes (Signed)
Pre visit review using our clinic review tool, if applicable. No additional management support is needed unless otherwise documented below in the visit note. 

## 2015-10-14 LAB — HM MAMMOGRAPHY: HM MAMMO: NORMAL (ref 0–4)

## 2015-10-17 DIAGNOSIS — Z6825 Body mass index (BMI) 25.0-25.9, adult: Secondary | ICD-10-CM | POA: Diagnosis not present

## 2015-10-17 DIAGNOSIS — Z3040 Encounter for surveillance of contraceptives, unspecified: Secondary | ICD-10-CM | POA: Diagnosis not present

## 2015-10-17 DIAGNOSIS — Z1231 Encounter for screening mammogram for malignant neoplasm of breast: Secondary | ICD-10-CM | POA: Diagnosis not present

## 2015-10-17 DIAGNOSIS — Z01419 Encounter for gynecological examination (general) (routine) without abnormal findings: Secondary | ICD-10-CM | POA: Diagnosis not present

## 2015-10-17 DIAGNOSIS — D251 Intramural leiomyoma of uterus: Secondary | ICD-10-CM | POA: Diagnosis not present

## 2015-10-17 DIAGNOSIS — N946 Dysmenorrhea, unspecified: Secondary | ICD-10-CM | POA: Diagnosis not present

## 2015-11-14 ENCOUNTER — Ambulatory Visit (INDEPENDENT_AMBULATORY_CARE_PROVIDER_SITE_OTHER): Payer: BLUE CROSS/BLUE SHIELD | Admitting: Family Medicine

## 2015-11-14 ENCOUNTER — Encounter: Payer: Self-pay | Admitting: Family Medicine

## 2015-11-14 VITALS — BP 117/81 | HR 66 | Temp 98.2°F | Resp 16 | Ht 64.0 in | Wt 142.5 lb

## 2015-11-14 DIAGNOSIS — G5603 Carpal tunnel syndrome, bilateral upper limbs: Secondary | ICD-10-CM | POA: Diagnosis not present

## 2015-11-14 DIAGNOSIS — R0683 Snoring: Secondary | ICD-10-CM | POA: Diagnosis not present

## 2015-11-14 DIAGNOSIS — R202 Paresthesia of skin: Secondary | ICD-10-CM | POA: Diagnosis not present

## 2015-11-14 DIAGNOSIS — M545 Low back pain, unspecified: Secondary | ICD-10-CM

## 2015-11-14 MED ORDER — PREDNISONE 10 MG PO TABS: ORAL_TABLET | ORAL | Status: DC

## 2015-11-14 NOTE — Progress Notes (Signed)
   Subjective:    Patient ID: Ann Bass, female    DOB: [birth year], 45 y.o.   MRN: [id number]  HPI Numbness/tingling- pt reports sxs started 'a couple of weeks ago'.  sxs have 'been getting worse and worse'.  Pt reports that in the last week 'it's been creeping up my legs'- now to just below knees bilaterally.  Worse w/ sitting.  Numbness is constant but worsens w/ exercise.  Pt reports some hand tingling which is new.  No leg weakness.  Pt feels that the numbness is associated w/ her LBP- which has been intermittent since having kids.  Pain has been constant for same duration as leg tingling.    Snoring- pt is not able to sleep on side due to hip pain.  When she sleeps on her back she snores loudly and will wake up short of breath and almost gasping for breath.  Husband has noted breathing pauses.  Increased daytime sleepiness.   Review of Systems For ROS see HPI     Objective:   Physical Exam  Constitutional: She is oriented to person, place, and time. She appears well-developed and well-nourished. No distress.  HENT:  Head: Normocephalic and atraumatic.  Cardiovascular: Intact distal pulses.   Musculoskeletal: She exhibits no edema or tenderness.  Neurological: She is alert and oriented to person, place, and time. She has normal reflexes. No cranial nerve deficit. Coordination normal.  (-) SLR bilaterally + phalen's bilaterally  Skin: Skin is warm and dry.  Psychiatric: She has a normal mood and affect. Her behavior is normal. Thought content normal.  Vitals reviewed.         Assessment & Plan:  Tingling in feet- new.  Suspect that this is due to currently low back pain and inflammation around the nerves.  Start prednisone as directed.  Reviewed supportive care and red flags that should prompt return.  Pt expressed understanding and is in agreement w/ plan.   Carpal tunnel syndrome- new.  Pt does a lot of typing and has been doing exercises that involve handstands and  pushups- putting direct pressure on the wrist.  + Phalen's on PE.  Start prednisone as above and start wearing wrist splints nightly.  If no improvement will need referral to hand specialist.  Pt expressed understanding and is in agreement w/ plan.   Snoring- new.  Pt's husband concerned for sleep apnea.  Refer to pulmonary for evaluation and likely sleep study.  Pt expressed understanding and is in agreement w/ plan.

## 2015-11-14 NOTE — Progress Notes (Signed)
Pre visit review using our clinic review tool, if applicable. No additional management support is needed unless otherwise documented below in the visit note. 

## 2015-11-14 NOTE — Patient Instructions (Signed)
Follow up by phone/MyChart in 10 days to let me know how the tingling is doing Start the prednisone as directed- take w/ food Get wrist splints at pharmacy to wear at night to help w/ carpal tunnel symptoms Hold ibuprofen, Aleve, motrin, etc while on the Prednisone.  You can take tylenol as needed for pain We'll call you with your pulmonary appt for the snoring and possible sleep apnea Call with any questions or concerns Hang in there!!!

## 2015-11-24 ENCOUNTER — Encounter: Payer: Self-pay | Admitting: Family Medicine

## 2015-11-24 DIAGNOSIS — R202 Paresthesia of skin: Secondary | ICD-10-CM

## 2015-11-25 NOTE — Telephone Encounter (Signed)
Referral placed.

## 2015-12-04 DIAGNOSIS — N946 Dysmenorrhea, unspecified: Secondary | ICD-10-CM | POA: Diagnosis not present

## 2015-12-04 DIAGNOSIS — D259 Leiomyoma of uterus, unspecified: Secondary | ICD-10-CM | POA: Diagnosis not present

## 2015-12-04 DIAGNOSIS — N800 Endometriosis of uterus: Secondary | ICD-10-CM | POA: Diagnosis not present

## 2016-01-11 ENCOUNTER — Other Ambulatory Visit: Payer: Self-pay | Admitting: Family Medicine

## 2016-01-17 ENCOUNTER — Ambulatory Visit: Payer: BLUE CROSS/BLUE SHIELD | Admitting: Neurology

## 2016-01-21 DIAGNOSIS — R5381 Other malaise: Secondary | ICD-10-CM | POA: Diagnosis not present

## 2016-01-21 DIAGNOSIS — E049 Nontoxic goiter, unspecified: Secondary | ICD-10-CM | POA: Diagnosis not present

## 2016-01-30 DIAGNOSIS — R5383 Other fatigue: Secondary | ICD-10-CM | POA: Diagnosis not present

## 2016-01-30 DIAGNOSIS — E039 Hypothyroidism, unspecified: Secondary | ICD-10-CM | POA: Diagnosis not present

## 2016-01-30 DIAGNOSIS — N949 Unspecified condition associated with female genital organs and menstrual cycle: Secondary | ICD-10-CM | POA: Diagnosis not present

## 2016-02-03 DIAGNOSIS — K649 Unspecified hemorrhoids: Secondary | ICD-10-CM | POA: Diagnosis not present

## 2016-02-03 DIAGNOSIS — Z86010 Personal history of colonic polyps: Secondary | ICD-10-CM | POA: Diagnosis not present

## 2016-02-03 DIAGNOSIS — Z800 Family history of malignant neoplasm of digestive organs: Secondary | ICD-10-CM | POA: Diagnosis not present

## 2016-02-03 DIAGNOSIS — Z1211 Encounter for screening for malignant neoplasm of colon: Secondary | ICD-10-CM | POA: Diagnosis not present

## 2016-02-05 ENCOUNTER — Institutional Professional Consult (permissible substitution): Payer: BLUE CROSS/BLUE SHIELD | Admitting: Pulmonary Disease

## 2016-04-03 DIAGNOSIS — J209 Acute bronchitis, unspecified: Secondary | ICD-10-CM | POA: Diagnosis not present

## 2016-04-03 DIAGNOSIS — J069 Acute upper respiratory infection, unspecified: Secondary | ICD-10-CM | POA: Diagnosis not present

## 2016-04-27 ENCOUNTER — Other Ambulatory Visit: Payer: Self-pay | Admitting: Family Medicine

## 2016-05-18 HISTORY — PX: COLONOSCOPY: SHX174

## 2016-05-22 ENCOUNTER — Encounter: Payer: Self-pay | Admitting: Family Medicine

## 2016-05-22 MED ORDER — CITALOPRAM HYDROBROMIDE 20 MG PO TABS: 20.0000 mg | ORAL_TABLET | Freq: Every day | ORAL | 0 refills | Status: DC

## 2016-06-11 DIAGNOSIS — E039 Hypothyroidism, unspecified: Secondary | ICD-10-CM | POA: Diagnosis not present

## 2016-06-11 DIAGNOSIS — R5383 Other fatigue: Secondary | ICD-10-CM | POA: Diagnosis not present

## 2016-07-01 DIAGNOSIS — R5383 Other fatigue: Secondary | ICD-10-CM | POA: Diagnosis not present

## 2016-07-01 DIAGNOSIS — E039 Hypothyroidism, unspecified: Secondary | ICD-10-CM | POA: Diagnosis not present

## 2016-08-17 ENCOUNTER — Other Ambulatory Visit: Payer: Self-pay | Admitting: Family Medicine

## 2016-09-17 ENCOUNTER — Ambulatory Visit (INDEPENDENT_AMBULATORY_CARE_PROVIDER_SITE_OTHER): Payer: BLUE CROSS/BLUE SHIELD | Admitting: Family Medicine

## 2016-09-17 ENCOUNTER — Encounter: Payer: Self-pay | Admitting: Family Medicine

## 2016-09-17 VITALS — BP 118/78 | HR 70 | Temp 98.2°F | Resp 16 | Ht 64.0 in | Wt 144.0 lb

## 2016-09-17 DIAGNOSIS — R7989 Other specified abnormal findings of blood chemistry: Secondary | ICD-10-CM | POA: Diagnosis not present

## 2016-09-17 DIAGNOSIS — E010 Iodine-deficiency related diffuse (endemic) goiter: Secondary | ICD-10-CM

## 2016-09-17 DIAGNOSIS — M21619 Bunion of unspecified foot: Secondary | ICD-10-CM | POA: Diagnosis not present

## 2016-09-17 DIAGNOSIS — E559 Vitamin D deficiency, unspecified: Secondary | ICD-10-CM | POA: Diagnosis not present

## 2016-09-17 DIAGNOSIS — Z0000 Encounter for general adult medical examination without abnormal findings: Secondary | ICD-10-CM | POA: Diagnosis not present

## 2016-09-17 LAB — CBC WITH DIFFERENTIAL/PLATELET
Basophils Absolute: 0.1 10*3/uL (ref 0.0–0.1)
Basophils Relative: 0.9 % (ref 0.0–3.0)
EOS PCT: 4.3 % (ref 0.0–5.0)
Eosinophils Absolute: 0.3 10*3/uL (ref 0.0–0.7)
HCT: 42.7 % (ref 36.0–46.0)
Hemoglobin: 14.4 g/dL (ref 12.0–15.0)
LYMPHS ABS: 1.9 10*3/uL (ref 0.7–4.0)
Lymphocytes Relative: 26.9 % (ref 12.0–46.0)
MCHC: 33.8 g/dL (ref 30.0–36.0)
MCV: 89.1 fl (ref 78.0–100.0)
MONOS PCT: 4.6 % (ref 3.0–12.0)
Monocytes Absolute: 0.3 10*3/uL (ref 0.1–1.0)
Neutro Abs: 4.3 10*3/uL (ref 1.4–7.7)
Neutrophils Relative %: 63.3 % (ref 43.0–77.0)
Platelets: 260 10*3/uL (ref 150.0–400.0)
RBC: 4.79 Mil/uL (ref 3.87–5.11)
RDW: 13.1 % (ref 11.5–15.5)
WBC: 6.9 10*3/uL (ref 4.0–10.5)

## 2016-09-17 LAB — BASIC METABOLIC PANEL
BUN: 10 mg/dL (ref 6–23)
CHLORIDE: 104 meq/L (ref 96–112)
CO2: 27 mEq/L (ref 19–32)
Calcium: 9 mg/dL (ref 8.4–10.5)
Creatinine, Ser: 0.56 mg/dL (ref 0.40–1.20)
GFR: 124.09 mL/min (ref >60.00)
Glucose, Bld: 77 mg/dL (ref 70–99)
POTASSIUM: 4.1 meq/L (ref 3.5–5.1)
Sodium: 138 mEq/L (ref 135–145)

## 2016-09-17 LAB — VITAMIN D 25 HYDROXY (VIT D DEFICIENCY, FRACTURES): VITD: 39.93 ng/mL (ref 30.00–100.00)

## 2016-09-17 LAB — TSH: TSH: 0.77 u[IU]/mL (ref 0.35–4.50)

## 2016-09-17 LAB — HEPATIC FUNCTION PANEL
ALT: 19 U/L (ref 0–35)
AST: 15 U/L (ref 0–37)
Albumin: 4 g/dL (ref 3.5–5.2)
Alkaline Phosphatase: 40 U/L (ref 39–117)
Bilirubin, Direct: 0.1 mg/dL (ref 0.0–0.3)
Total Bilirubin: 0.4 mg/dL (ref 0.2–1.2)
Total Protein: 6.3 g/dL (ref 6.0–8.3)

## 2016-09-17 LAB — LIPID PANEL
Cholesterol: 176 mg/dL (ref 0–200)
HDL: 48 mg/dL (ref >39.00)
NonHDL: 127.77
Total CHOL/HDL Ratio: 4
Triglycerides: 237 mg/dL — ABNORMAL HIGH (ref 0.0–149.0)
VLDL: 47.4 mg/dL — ABNORMAL HIGH (ref 0.0–40.0)

## 2016-09-17 LAB — LDL CHOLESTEROL, DIRECT: LDL DIRECT: 104 mg/dL

## 2016-09-17 NOTE — Assessment & Plan Note (Signed)
Pt's PE WNL w/ exception of thyromegaly.  UTD on pap, mammo due end of month.  Discussed need for healthy diet and regular exercise.  Check labs.  Anticipatory guidance provided.

## 2016-09-17 NOTE — Progress Notes (Signed)
   Subjective:    Patient ID: Ann Bass, female    DOB: Feb 09, 1971, 46 y.o.   MRN: [id number]  HPI CPE- UTD on pap, due for mammo at end of month.  UTD on Tdap.   Review of Systems Patient reports no vision/ hearing changes, adenopathy,fever, weight change,  persistant/recurrent hoarseness , swallowing issues, chest pain, palpitations, edema, persistant/recurrent cough, hemoptysis, dyspnea (rest/exertional/paroxysmal nocturnal), gastrointestinal bleeding (melena, rectal bleeding), abdominal pain, significant heartburn, bowel changes, GU symptoms (dysuria, hematuria, incontinence), Gyn symptoms (abnormal  bleeding, pain),  syncope, focal weakness, memory loss, skin/hair/nail changes, abnormal bruising or bleeding, anxiety, or depression.   + numbness of feet when walking + bunion and flat feet    Objective:   Physical Exam General Appearance:    Alert, cooperative, no distress, appears stated age  Head:    Normocephalic, without obvious abnormality, atraumatic  Eyes:    PERRL, conjunctiva/corneas clear, EOM's intact, fundi    benign, both eyes  Ears:    Normal TM's and external ear canals, both ears  Nose:   Nares normal, septum midline, mucosa normal, no drainage    or sinus tenderness  Throat:   Lips, mucosa, and tongue normal; teeth and gums normal  Neck:   Supple, symmetrical, trachea midline, no adenopathy;    Thyroid: generalized fullness  Back:     Symmetric, no curvature, ROM normal, no CVA tenderness  Lungs:     Clear to auscultation bilaterally, respirations unlabored  Chest Wall:    No tenderness or deformity   Heart:    Regular rate and rhythm, S1 and S2 normal, no murmur, rub   or gallop  Breast Exam:    Deferred to GYN  Abdomen:     Soft, non-tender, bowel sounds active all four quadrants,    no masses, no organomegaly  Genitalia:    Deferred to GYN  Rectal:    Extremities:   Extremities normal, atraumatic, no cyanosis or edema  Pulses:   2+ and symmetric all  extremities  Skin:   Skin color, texture, turgor normal, no rashes or lesions  Lymph nodes:   Cervical, supraclavicular, and axillary nodes normal  Neurologic:   CNII-XII intact, normal strength, sensation and reflexes    throughout          Assessment & Plan:

## 2016-09-17 NOTE — Assessment & Plan Note (Signed)
Pt has hx of this.  She reports that she will fatigue easily so will repeat levels today and replete prn.

## 2016-09-17 NOTE — Progress Notes (Signed)
Pre visit review using our clinic review tool, if applicable. No additional management support is needed unless otherwise documented below in the visit note. 

## 2016-09-17 NOTE — Patient Instructions (Addendum)
Follow up in 1 year or as needed We'll notify you of your lab results and make any changes if needed We'll call you with your thyroid ultrasound Continue to work on healthy diet and regular exercise We'll call you with your podiatry appt Call with any questions or concerns Happy Spring!!!

## 2016-09-17 NOTE — Assessment & Plan Note (Signed)
Chronic problem.  Pt had US done by 'homeopath' 'years ago'.  Pt reports some fullness in neck.  Repeat US and check TSH.

## 2016-09-18 ENCOUNTER — Other Ambulatory Visit: Payer: Self-pay | Admitting: General Practice

## 2016-09-18 MED ORDER — FENOFIBRATE 160 MG PO TABS: 160.0000 mg | ORAL_TABLET | Freq: Every day | ORAL | 6 refills | Status: DC

## 2016-09-24 ENCOUNTER — Other Ambulatory Visit: Payer: BLUE CROSS/BLUE SHIELD

## 2016-10-01 ENCOUNTER — Ambulatory Visit
Admission: RE | Admit: 2016-10-01 | Discharge: 2016-10-01 | Disposition: A | Discharge status: Home / Self Care | Payer: BLUE CROSS/BLUE SHIELD | Source: Ambulatory Visit | Attending: Family Medicine | Admitting: Family Medicine

## 2016-10-01 DIAGNOSIS — E010 Iodine-deficiency related diffuse (endemic) goiter: Secondary | ICD-10-CM

## 2016-10-13 DIAGNOSIS — E039 Hypothyroidism, unspecified: Secondary | ICD-10-CM | POA: Diagnosis not present

## 2016-10-13 DIAGNOSIS — R5381 Other malaise: Secondary | ICD-10-CM | POA: Diagnosis not present

## 2016-10-13 DIAGNOSIS — N949 Unspecified condition associated with female genital organs and menstrual cycle: Secondary | ICD-10-CM | POA: Diagnosis not present

## 2016-10-13 DIAGNOSIS — E049 Nontoxic goiter, unspecified: Secondary | ICD-10-CM | POA: Diagnosis not present

## 2016-10-29 DIAGNOSIS — E039 Hypothyroidism, unspecified: Secondary | ICD-10-CM | POA: Diagnosis not present

## 2016-10-29 DIAGNOSIS — R5383 Other fatigue: Secondary | ICD-10-CM | POA: Diagnosis not present

## 2016-10-29 DIAGNOSIS — N949 Unspecified condition associated with female genital organs and menstrual cycle: Secondary | ICD-10-CM | POA: Diagnosis not present

## 2016-11-13 ENCOUNTER — Other Ambulatory Visit: Payer: Self-pay | Admitting: Family Medicine

## 2016-12-28 ENCOUNTER — Encounter: Payer: Self-pay | Admitting: Family Medicine

## 2016-12-28 DIAGNOSIS — R200 Anesthesia of skin: Secondary | ICD-10-CM

## 2016-12-28 DIAGNOSIS — M21619 Bunion of unspecified foot: Secondary | ICD-10-CM

## 2016-12-28 DIAGNOSIS — R202 Paresthesia of skin: Secondary | ICD-10-CM

## 2017-01-07 DIAGNOSIS — Z6826 Body mass index (BMI) 26.0-26.9, adult: Secondary | ICD-10-CM | POA: Diagnosis not present

## 2017-01-07 DIAGNOSIS — Z124 Encounter for screening for malignant neoplasm of cervix: Secondary | ICD-10-CM | POA: Diagnosis not present

## 2017-01-07 DIAGNOSIS — Z1231 Encounter for screening mammogram for malignant neoplasm of breast: Secondary | ICD-10-CM | POA: Diagnosis not present

## 2017-01-07 DIAGNOSIS — R61 Generalized hyperhidrosis: Secondary | ICD-10-CM | POA: Diagnosis not present

## 2017-01-07 DIAGNOSIS — Z01419 Encounter for gynecological examination (general) (routine) without abnormal findings: Secondary | ICD-10-CM | POA: Diagnosis not present

## 2017-01-07 LAB — HM MAMMOGRAPHY

## 2017-01-07 LAB — HM PAP SMEAR

## 2017-01-08 ENCOUNTER — Encounter: Payer: Self-pay | Admitting: General Practice

## 2017-01-20 DIAGNOSIS — H903 Sensorineural hearing loss, bilateral: Secondary | ICD-10-CM | POA: Diagnosis not present

## 2017-01-21 ENCOUNTER — Encounter: Payer: Self-pay | Admitting: Family Medicine

## 2017-01-21 NOTE — Telephone Encounter (Signed)
Spoke with patient regarding symptoms. Patient reports SOB when climbing steps or working out and feels like heart is pounding. Denies chest pain. Symptoms have been on-going over a year, but seems to be getting worse. Appt made with PCP for Tuesday, 01/26/17. Advised patient if symptoms worsen or she experiences chest pain, to go to the emergency room for evaluation. Patient verbalized understanding.

## 2017-01-21 NOTE — Telephone Encounter (Signed)
LM requesting call back to discuss symptoms.  

## 2017-01-26 ENCOUNTER — Ambulatory Visit (INDEPENDENT_AMBULATORY_CARE_PROVIDER_SITE_OTHER): Payer: BLUE CROSS/BLUE SHIELD | Admitting: Family Medicine

## 2017-01-26 ENCOUNTER — Encounter: Payer: Self-pay | Admitting: Family Medicine

## 2017-01-26 VITALS — BP 119/83 | HR 76 | Temp 97.9°F | Resp 16 | Ht 64.0 in | Wt 145.2 lb

## 2017-01-26 DIAGNOSIS — R0609 Other forms of dyspnea: Secondary | ICD-10-CM | POA: Diagnosis not present

## 2017-01-26 NOTE — Progress Notes (Signed)
   Subjective:    Patient ID: Ann Bass, female    DOB: 10-19-1970, 46 y.o.   MRN: [id number]  HPI Dyspnea on exertion- pt is concerned bc she has the sense that she is going to pass out when working out.  This Her HR monitor registers that she is working at 60-80% of max HR during this time and her workout ([employer]) prefers that she be in the 84-91%  Workouts last 60 minutes- doesn't have difficulty w/ treadmill or rowing but notes increased SOB w/ body weight exercises.  Pt also notes some SOB w/ walking or going up stairs (1 flight).  Pt reports intermittent palpitations- not necessarily correlated to SOB.  Pt is on OCPs, not a smoker.     Review of Systems For ROS see HPI     Objective:   Physical Exam  Constitutional: She is oriented to person, place, and time. She appears well-developed and well-nourished. No distress.  HENT:  Head: Normocephalic and atraumatic.  Eyes: Pupils are equal, round, and reactive to light. Conjunctivae and EOM are normal.  Neck: Normal range of motion. Neck supple. No thyromegaly present.  Cardiovascular: Normal rate, regular rhythm, normal heart sounds and intact distal pulses.   No murmur heard. Loud heart sounds over aortic area but normal  Pulmonary/Chest: Effort normal and breath sounds normal. No respiratory distress.  Abdominal: Soft. She exhibits no distension. There is no tenderness.  Musculoskeletal: She exhibits no edema.  Lymphadenopathy:    She has no cervical adenopathy.  Neurological: She is alert and oriented to person, place, and time.  Skin: Skin is warm and dry.  Psychiatric: She has a normal mood and affect. Her behavior is normal.  Vitals reviewed.         Assessment & Plan:  Dyspnea on exertion- new to provider but ongoing for pt.  She reports she gets SOB w/ climbing 1 flight of stairs or moderate walking and will have sense of going to pass out while exercising.  She is exercising in the 60-83% of max HR range  which I told her is enough.  She is on OCPs which increases her risk of clots.  O2 sats and pulse WNL- as is EKG today.  Will get Ddimer and if + will get CTA.  If (-) will proceed w/ cardiology evaluation as she likely needs a stress test.  Reviewed supportive care and red flags that should prompt return.  Pt expressed understanding and is in agreement w/ plan.

## 2017-01-26 NOTE — Patient Instructions (Signed)
Follow up as needed or as scheduled We'll notify you of your lab results and determine if we need to do the CT scan If we don't do the CT scan, we'll do the cardiology referral for the stress test Make sure you are drinking plenty of water and slowing down as needed- feeling as if you are going to pass out is a sign of over-exertion Call with any questions or concerns- or if symptoms change or worsen, go to the [facility] in there!  We'll figure this out!

## 2017-01-26 NOTE — Progress Notes (Signed)
Pre visit review using our clinic review tool, if applicable. No additional management support is needed unless otherwise documented below in the visit note. 

## 2017-01-27 LAB — D-DIMER, QUANTITATIVE (NOT AT ARMC): D DIMER QUANT: 0.23 ug{FEU}/mL (ref <0.50)

## 2017-01-27 NOTE — Progress Notes (Signed)
Called pt and lmovm to return call.

## 2017-01-28 ENCOUNTER — Other Ambulatory Visit: Payer: Self-pay | Admitting: Family Medicine

## 2017-01-28 DIAGNOSIS — R0602 Shortness of breath: Secondary | ICD-10-CM

## 2017-02-08 ENCOUNTER — Other Ambulatory Visit: Payer: Self-pay | Admitting: Family Medicine

## 2017-02-15 ENCOUNTER — Telehealth: Payer: Self-pay | Admitting: Cardiology

## 2017-02-15 ENCOUNTER — Other Ambulatory Visit: Payer: Self-pay | Admitting: Family Medicine

## 2017-02-15 ENCOUNTER — Encounter (HOSPITAL_BASED_OUTPATIENT_CLINIC_OR_DEPARTMENT_OTHER): Payer: Self-pay | Admitting: Emergency Medicine

## 2017-02-15 ENCOUNTER — Encounter: Payer: Self-pay | Admitting: Family Medicine

## 2017-02-15 ENCOUNTER — Emergency Department (HOSPITAL_BASED_OUTPATIENT_CLINIC_OR_DEPARTMENT_OTHER)
Admission: EM | Admit: 2017-02-15 | Discharge: 2017-02-15 | Disposition: A | Discharge status: Home / Self Care | Payer: BLUE CROSS/BLUE SHIELD | Attending: Emergency Medicine | Admitting: Emergency Medicine

## 2017-02-15 ENCOUNTER — Emergency Department (HOSPITAL_BASED_OUTPATIENT_CLINIC_OR_DEPARTMENT_OTHER): Payer: BLUE CROSS/BLUE SHIELD

## 2017-02-15 DIAGNOSIS — R072 Precordial pain: Secondary | ICD-10-CM

## 2017-02-15 DIAGNOSIS — R0602 Shortness of breath: Secondary | ICD-10-CM | POA: Diagnosis not present

## 2017-02-15 DIAGNOSIS — R002 Palpitations: Secondary | ICD-10-CM | POA: Insufficient documentation

## 2017-02-15 DIAGNOSIS — G4730 Sleep apnea, unspecified: Secondary | ICD-10-CM

## 2017-02-15 DIAGNOSIS — R079 Chest pain, unspecified: Secondary | ICD-10-CM | POA: Diagnosis not present

## 2017-02-15 LAB — CBC WITH DIFFERENTIAL/PLATELET
BASOS ABS: 0 10*3/uL (ref 0.0–0.1)
BASOS PCT: 0 %
Eosinophils Absolute: 0.3 10*3/uL (ref 0.0–0.7)
Eosinophils Relative: 5 %
HCT: 41.2 % (ref 36.0–46.0)
Hemoglobin: 14.2 g/dL (ref 12.0–15.0)
Lymphocytes Relative: 32 %
Lymphs Abs: 2.3 10*3/uL (ref 0.7–4.0)
MCH: 30.1 pg (ref 26.0–34.0)
MCHC: 34.5 g/dL (ref 30.0–36.0)
MCV: 87.3 fL (ref 78.0–100.0)
MONO ABS: 0.5 10*3/uL (ref 0.1–1.0)
Monocytes Relative: 7 %
NEUTROS ABS: 3.9 10*3/uL (ref 1.7–7.7)
Neutrophils Relative %: 56 %
PLATELETS: 272 10*3/uL (ref 150–400)
RBC: 4.72 MIL/uL (ref 3.87–5.11)
RDW: 12.7 % (ref 11.5–15.5)
WBC: 7 10*3/uL (ref 4.0–10.5)

## 2017-02-15 LAB — COMPREHENSIVE METABOLIC PANEL
ALT: 32 U/L (ref 14–54)
AST: 32 U/L (ref 15–41)
Albumin: 3.7 g/dL (ref 3.5–5.0)
Alkaline Phosphatase: 49 U/L (ref 38–126)
Anion gap: 8 (ref 5–15)
BUN: 15 mg/dL (ref 6–20)
CALCIUM: 9 mg/dL (ref 8.9–10.3)
CHLORIDE: 105 mmol/L (ref 101–111)
CO2: 22 mmol/L (ref 22–32)
Creatinine, Ser: 0.67 mg/dL (ref 0.44–1.00)
GFR calc Af Amer: >60 mL/min (ref >60)
GFR calc non Af Amer: >60 mL/min (ref >60)
GLUCOSE: 100 mg/dL — AB (ref 65–99)
Potassium: 3.5 mmol/L (ref 3.5–5.1)
SODIUM: 135 mmol/L (ref 135–145)
TOTAL PROTEIN: 6.8 g/dL (ref 6.5–8.1)
Total Bilirubin: 0.3 mg/dL (ref 0.3–1.2)

## 2017-02-15 LAB — TSH: TSH: 0.843 u[IU]/mL (ref 0.350–4.500)

## 2017-02-15 LAB — TROPONIN I: Troponin I: <0.03 ng/mL (ref <0.03)

## 2017-02-15 LAB — D-DIMER, QUANTITATIVE (NOT AT ARMC)

## 2017-02-15 MED ORDER — CITALOPRAM HYDROBROMIDE 20 MG PO TABS: 20.0000 mg | ORAL_TABLET | Freq: Every day | ORAL | 0 refills | Status: DC

## 2017-02-15 NOTE — Telephone Encounter (Signed)
Spoke with patient regarding symptoms. Patient reports continued SOB and left sided chest pain. States she has stopped breathing during her sleep and wakes gasping for air. Advised to go to Urgent Care for evaluation, patient agrees, plans to go to [employer]. Patient has appointment with cardiology 03/10/17, would like to know if she also needs sleep study performed.

## 2017-02-15 NOTE — ED Triage Notes (Signed)
Patient states for the last 2 -3 weeks she has had a palpations intermittently and at night she "stops breathing" - patient describes it as waking up gasping for air. Patient states that today she is having chest pressure and nausea since she has an "episode" last night

## 2017-02-15 NOTE — ED Provider Notes (Signed)
Emergency Department Provider Note   I have reviewed the triage vital signs and the nursing notes.   HISTORY  Chief Complaint Palpitations   HPI Ann Bass is a 46 y.o. female with PMH of thyromegaly presents to the emergency department for evaluation of heart palpitations and dyspnea. Symptoms been ongoing for the past several weeks but worsening over the past few days. Patient notes episodes of gasping for breath in the middle the night and feels badly afterwards. She also notes increased shortness of breath during the day. She is typically able to undergo her workouts with no significant symptoms. She occasionally has heart palpitations which do not seem to correlate with her shortness of breath. Pleuritic chest pain or pressure. No exertional pain or pressure. 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 No recent medication changes. Denies alcohol or drug use. No radiation symptoms. No modifying factors.  Past Medical History:  Diagnosis Date  . Allergy   . Depression    post-partum  . Inflammatory polyps of colon [facility])     Patient Active Problem List   Diagnosis Date Noted  . Vitamin D deficiency 09/17/2016  . Physical exam 09/13/2014  . Palpitations 09/13/2014  . Decreased hearing of both ears 09/13/2014  . Glucose intolerance (impaired glucose tolerance) 02/22/2013  . Other malaise and fatigue 02/22/2013  . Depression 02/22/2013  . Thyromegaly 02/22/2013    Past Surgical History:  Procedure Laterality Date  . broken leg  1982   Left Femur, traction and pin in leg    Current Outpatient Rx  . Order #: [id number] Class: Historical Med  . Order #: [id number] Class: Historical Med  . Order #: [id number] Class: Historical Med  . Order #: [id number] Class: Normal  . Order #: [id number] Class: Historical Med  . Order #: [id number] Class: Normal  . Order #: [id number] Class: Historical Med  . Order #: [id number] Class: Historical Med    . Order #: [id number] Class: Historical Med  . Order #: [id number] Class: Historical Med    Allergies Penicillins  Family History  Problem Relation Age of Onset  . Diabetes Mother   . Cancer Father        colon  . Cancer Paternal Grandmother        ovarian    Social History Social History  Substance Use Topics  . Smoking status: Never Smoker  . Smokeless tobacco: Never Used  . Alcohol use Yes    Review of Systems  Constitutional: No fever/chills Eyes: No visual changes. ENT: No sore throat. Cardiovascular: Positive chest pain and palpitations.  Respiratory: Positive shortness of breath. Gastrointestinal: No abdominal pain.  No nausea, no vomiting.  No diarrhea.  No constipation. Genitourinary: Negative for dysuria. Musculoskeletal: Negative for back pain. Skin: Negative for rash. Neurological: Negative for headaches, focal weakness or numbness.  10-point ROS otherwise negative.  ____________________________________________   PHYSICAL EXAM:  VITAL SIGNS: ED Triage Vitals  Enc Vitals Group     BP 02/15/17 1721 (!) 149/98     Pulse Rate 02/15/17 1721 73     Resp 02/15/17 1721 18     Temp 02/15/17 1721 98.5 F (36.9 C)     Temp Source 02/15/17 1721 Oral     SpO2 02/15/17 1721 98 %     Weight 02/15/17 1719 145 lb (65.8 kg)     Height 02/15/17 1719 5\' 4"  (1.626 m)     Pain Score 02/15/17 1719 1   Constitutional: Alert and oriented. Well appearing and  in no acute distress. Eyes: Conjunctivae are normal.  Head: Atraumatic. Nose: No congestion/rhinnorhea. Mouth/Throat: Mucous membranes are moist.  Neck: No stridor.  Cardiovascular: Normal rate, regular rhythm. Good peripheral circulation. Grossly normal heart sounds.   Respiratory: Normal respiratory effort.  No retractions. Lungs CTAB. Gastrointestinal: Soft and nontender. No distention.  Musculoskeletal: No lower extremity tenderness nor edema. No gross deformities of extremities. Neurologic:  Normal speech  and language. No gross focal neurologic deficits are appreciated.  Skin:  Skin is warm, dry and intact. No rash noted.   ____________________________________________   LABS (all labs ordered are listed, but only abnormal results are displayed)  Labs Reviewed  COMPREHENSIVE METABOLIC PANEL - Abnormal; Notable for the following:       Result Value   Glucose, Bld 100 (*)    All other components within normal limits  CBC WITH DIFFERENTIAL/PLATELET  TROPONIN I  TSH  D-DIMER, QUANTITATIVE (NOT AT [facility])   ____________________________________________  EKG   EKG Interpretation  Date/Time:  Monday February 15 2017 17:28:54 EDT Ventricular Rate:  62 PR Interval:    QRS Duration: 87 QT Interval:  427 QTC Calculation: 434 R Axis:   44 Text Interpretation:  Sinus rhythm No STEMI.  Confirmed by Nanda Quinton [phone]) on 02/15/2017 5:49:17 PM Also confirmed by Nanda Quinton [phone]), editor Hattie Perch [phone])  on 02/16/2017 7:19:58 AM       ____________________________________________  RADIOLOGY  Dg Chest 2 View  Result Date: 02/15/2017 CLINICAL DATA:  Shortness of breath and chest pain. EXAM: CHEST  2 VIEW COMPARISON:  None. FINDINGS: The heart size and mediastinal contours are within normal limits. Both lungs are clear. The visualized skeletal structures are unremarkable. IMPRESSION: No active cardiopulmonary disease. Electronically Signed   By: Titus Dubin M.D.   On: 02/15/2017 18:08    ____________________________________________   PROCEDURES  Procedure(s) performed:   Procedures  None ____________________________________________   INITIAL IMPRESSION / ASSESSMENT AND PLAN / ED COURSE  Pertinent labs & imaging results that were available during my care of the patient were reviewed by me and considered in my medical decision making (see chart for details).  Patient resents to the emergency department for evaluation of intermittent palpitations, dyspnea, and  waking at night gasping for air. Patient likely needs outpatient sleep study and will discuss this with her primary care physician. Very low suspicion for ACS or PE. Patient is on OCP. EKG unremarkable. Labs are largely unremarkable. TSH is pending and will follow up with PCP for further thyroid testing. Provided contact information for outpatient cardiology.   Differential includes all life-threatening causes for chest pain. This includes but is not exclusive to acute coronary syndrome, aortic dissection, pulmonary embolism, cardiac tamponade, community-acquired pneumonia, pericarditis, musculoskeletal chest wall pain, etc.  At this time, I do not feel there is any life-threatening condition present. I have reviewed and discussed all results (EKG, imaging, lab, urine as appropriate), exam findings with patient. I have reviewed nursing notes and appropriate previous records.  I feel the patient is safe to be discharged home without further emergent workup. Discussed usual and customary return precautions. Patient and family (if present) verbalize understanding and are comfortable with this plan.  Patient will follow-up with their primary care provider. If they do not have a primary care provider, information for follow-up has been provided to them. All questions have been answered.  ____________________________________________  FINAL CLINICAL IMPRESSION(S) / ED DIAGNOSES  Final diagnoses:  Palpitations  Precordial chest pain  Shortness of breath  MEDICATIONS GIVEN DURING THIS VISIT:  Medications - No data to display   NEW OUTPATIENT MEDICATIONS STARTED DURING THIS VISIT:  None   Note:  This document was prepared using Dragon voice recognition software and may include unintentional dictation errors.  Nanda Quinton, MD Emergency Medicine    Long, [facility], MD 02/16/17 [phone]

## 2017-02-15 NOTE — Discharge Instructions (Signed)

## 2017-02-16 ENCOUNTER — Telehealth: Payer: Self-pay

## 2017-02-16 NOTE — Telephone Encounter (Signed)
LM requesting call back. Patient needs to be scheduled for ED f/u this week per Elyn Aquas, PA-C.

## 2017-02-18 ENCOUNTER — Encounter: Payer: Self-pay | Admitting: Cardiology

## 2017-02-18 ENCOUNTER — Ambulatory Visit (INDEPENDENT_AMBULATORY_CARE_PROVIDER_SITE_OTHER): Payer: BLUE CROSS/BLUE SHIELD | Admitting: Cardiology

## 2017-02-18 ENCOUNTER — Encounter: Payer: Self-pay | Admitting: Physician Assistant

## 2017-02-18 ENCOUNTER — Ambulatory Visit (INDEPENDENT_AMBULATORY_CARE_PROVIDER_SITE_OTHER): Payer: BLUE CROSS/BLUE SHIELD | Admitting: Physician Assistant

## 2017-02-18 VITALS — BP 122/82 | HR 80 | Temp 98.9°F | Resp 14 | Ht 64.0 in | Wt 144.0 lb

## 2017-02-18 VITALS — BP 118/96 | HR 71 | Ht 64.0 in | Wt 145.0 lb

## 2017-02-18 DIAGNOSIS — G4719 Other hypersomnia: Secondary | ICD-10-CM

## 2017-02-18 DIAGNOSIS — G4730 Sleep apnea, unspecified: Secondary | ICD-10-CM | POA: Diagnosis not present

## 2017-02-18 DIAGNOSIS — R002 Palpitations: Secondary | ICD-10-CM

## 2017-02-18 DIAGNOSIS — R0609 Other forms of dyspnea: Secondary | ICD-10-CM | POA: Diagnosis not present

## 2017-02-18 DIAGNOSIS — R0602 Shortness of breath: Secondary | ICD-10-CM | POA: Insufficient documentation

## 2017-02-18 NOTE — Progress Notes (Signed)
Patient presents to clinic today for ER follow-up for palpitations and dyspnea at night. ER workup included unremarkable EKG, D-dimer, Troponin and CXR. Patient was discharged to follow-up her with PCP to discuss next steps. Since discharge, patient endorses doing well overall. Has noted complaints from family of increased snoring. Husband notes she has had several more apneic episodes at night. Denies any chest pain, palpitations since discharge. Patient notes significant daytime somnolence despite 8-9 hours of sleep at night. Has appt pending for the end of the month with Cardiology, placed by her PCP for stress testing. She is scheduling to see Dr. Radford Pax who also deals in sleep medicine.   Past Medical History:  Diagnosis Date  . Allergy   . Depression    post-partum  . Inflammatory polyps of colon [facility])     Current Outpatient Prescriptions on File Prior to Visit  Medication Sig Dispense Refill  . ARMOUR THYROID 15 MG tablet     . ARMOUR THYROID 60 MG tablet Take 1 tablet by mouth daily.    . Ascorbic Acid (VITAMIN C) 1000 MG tablet Take 1,000 mg by mouth daily.    . citalopram (CELEXA) 20 MG tablet Take 1 tablet (20 mg total) by mouth daily. 90 tablet 0  . drospirenone-ethinyl estradiol (NIKKI) 3-0.02 MG tablet Take 1 tablet by mouth daily.    . Iodine, Kelp, TABS 12.88m three times a week    . Multiple Vitamin (MULTIVITAMIN) tablet Take 1 tablet by mouth daily.    . Omega-3 Fatty Acids (FISH OIL PO) Take by mouth.    . Pregnenolone POWD by Does not apply route. 1.527m    No current facility-administered medications on file prior to visit.     Allergies  Allergen Reactions  . Penicillins Rash    Family History  Problem Relation Age of Onset  . Diabetes Mother   . Cancer Father        colon  . Cancer Paternal Grandmother        ovarian   Social History   Social History  . Marital status: Married    Spouse name: N/A  . Number of children: N/A  . Years of education:  N/A   Social History Main Topics  . Smoking status: Never Smoker  . Smokeless tobacco: Never Used  . Alcohol use Yes  . Drug use: No  . Sexual activity: Yes   Other Topics Concern  . None   Social History Narrative  . None   Review of Systems - See HPI.  All other ROS are negative.  BP 122/82   Pulse 80   Temp 98.9 F (37.2 C) (Oral)   Resp 14   Ht 5' 4"  (1.626 m)   Wt 144 lb (65.3 kg)   LMP 02/15/2017   SpO2 98%   BMI 24.72 kg/m   Physical Exam  Constitutional: She is oriented to person, place, and time and well-developed, well-nourished, and in no distress.  HENT:  Head: Normocephalic and atraumatic.  Right Ear: External ear normal.  Left Ear: External ear normal.  Nose: Nose normal.  Mouth/Throat: Oropharynx is clear and moist. No oropharyngeal exudate.  TM within normal limits bilaterally.   Eyes: Pupils are equal, round, and reactive to light. Conjunctivae are normal.  Neck: Neck supple.  Cardiovascular: Normal rate, regular rhythm, normal heart sounds and intact distal pulses.   Pulmonary/Chest: Effort normal and breath sounds normal. No respiratory distress. She has no wheezes. She has no rales. She  exhibits no tenderness.  Abdominal: Soft.  Lymphadenopathy:    She has no cervical adenopathy.  Neurological: She is alert and oriented to person, place, and time.  Skin: Skin is warm and dry. No rash noted.  Psychiatric: Affect normal.  Vitals reviewed.   Recent Results (from the past 2160 hour(s))  HM MAMMOGRAPHY     Status: None   Collection Time: 01/07/17 12:00 AM  Result Value Ref Range   HM Mammogram 0-4 Bi-Rad 0-4 Bi-Rad, Self Reported Normal  HM PAP SMEAR     Status: None   Collection Time: 01/07/17 12:00 AM  Result Value Ref Range   HM Pap smear completed   D-Dimer, Quantitative     Status: None   Collection Time: 01/26/17 11:51 AM  Result Value Ref Range   D-Dimer, Quant 0.23 <0.50 mcg/mL FEU    Comment: . The D-Dimer test is used  frequently to exclude an acute PE or DVT. In patients with a low to moderate clinical risk assessment and a D-Dimer result <0.50 mcg/mL FEU, the likelihood of a PE or DVT is very low. However, a thromboembolic event should not be excluded solely on the basis of the D-Dimer level. Increased levels of D-Dimer are associated with a PE, DVT, DIC, malignancies, inflammation, sepsis, surgery, trauma, pregnancy, and advancing patient age. [Jama 2006 11:295(2):199-207] . For additional information, please refer to: http://education.questdiagnostics.com/faq/FAQ149 (This link is being provided for informational/ educational purposes only) .   Comprehensive metabolic panel     Status: Abnormal   Collection Time: 02/15/17  5:36 PM  Result Value Ref Range   Sodium 135 135 - 145 mmol/L   Potassium 3.5 3.5 - 5.1 mmol/L   Chloride 105 101 - 111 mmol/L   CO2 22 22 - 32 mmol/L   Glucose, Bld 100 (H) 65 - 99 mg/dL   BUN 15 6 - 20 mg/dL   Creatinine, Ser 0.67 0.44 - 1.00 mg/dL   Calcium 9.0 8.9 - 10.3 mg/dL   Total Protein 6.8 6.5 - 8.1 g/dL   Albumin 3.7 3.5 - 5.0 g/dL   AST 32 15 - 41 U/L   ALT 32 14 - 54 U/L   Alkaline Phosphatase 49 38 - 126 U/L   Total Bilirubin 0.3 0.3 - 1.2 mg/dL   GFR calc non Af Amer >60 >60 mL/min   GFR calc Af Amer >60 >60 mL/min    Comment: (NOTE) The eGFR has been calculated using the CKD EPI equation. This calculation has not been validated in all clinical situations. eGFR's persistently <60 mL/min signify possible Chronic Kidney Disease.    Anion gap 8 5 - 15  CBC with Differential     Status: None   Collection Time: 02/15/17  5:36 PM  Result Value Ref Range   WBC 7.0 4.0 - 10.5 K/uL   RBC 4.72 3.87 - 5.11 MIL/uL   Hemoglobin 14.2 12.0 - 15.0 g/dL   HCT 41.2 36.0 - 46.0 %   MCV 87.3 78.0 - 100.0 fL   MCH 30.1 26.0 - 34.0 pg   MCHC 34.5 30.0 - 36.0 g/dL   RDW 12.7 11.5 - 15.5 %   Platelets 272 150 - 400 K/uL   Neutrophils Relative % 56 %   Neutro  Abs 3.9 1.7 - 7.7 K/uL   Lymphocytes Relative 32 %   Lymphs Abs 2.3 0.7 - 4.0 K/uL   Monocytes Relative 7 %   Monocytes Absolute 0.5 0.1 - 1.0 K/uL   Eosinophils Relative 5 %  Eosinophils Absolute 0.3 0.0 - 0.7 K/uL   Basophils Relative 0 %   Basophils Absolute 0.0 0.0 - 0.1 K/uL  Troponin I     Status: None   Collection Time: 02/15/17  5:36 PM  Result Value Ref Range   Troponin I <0.03 <0.03 ng/mL  TSH     Status: None   Collection Time: 02/15/17  5:36 PM  Result Value Ref Range   TSH 0.843 0.350 - 4.500 uIU/mL    Comment: Performed by a 3rd Generation assay with a functional sensitivity of <=0.01 uIU/mL. Performed at [facility], [place] [address] [place], [place] [address]   D-dimer, quantitative (not at [facility])     Status: None   Collection Time: 02/15/17  5:36 PM  Result Value Ref Range   D-Dimer, Quant <0.27 0.00 - 0.50 ug/mL-FEU    Comment: (NOTE) At the manufacturer cut-off of 0.50 ug/mL FEU, this assay has been documented to exclude PE with a sensitivity and negative predictive value of 97 to 99%.  At this time, this assay has not been approved by the FDA to exclude DVT/VTE. Results should be correlated with clinical presentation.     Assessment/Plan: 1. Sleep apnea, unspecified type High suspicion giving symptoms. Vitals stable. Patient asymptomatic. Spoke with Cardiology and have her appointment moved up to this afternoon for further assessment. Will need OSA study.  2. Dyspnea on exertion Likely related to OSA but needs further assessment. Again exam today unremarkable. Vitals are stable. ER workup negative. Needs stress test and possible Echo. Cardiology appt this afternoon.    Leeanne Rio, PA-C

## 2017-02-18 NOTE — Progress Notes (Deleted)
Cardiology Office Note:    Date:  02/18/2017   ID:  Ann Bass, DOB [birth year], MRN [id number]  PCP:  [name], MD  Cardiologist:  Fransico Him, MD   Referring MD: [name], MD   No chief complaint on file.   History of Present Illness:    Ann Bass is a 46 y.o. female with a hx of ***  Past Medical History:  Diagnosis Date  . Allergy   . Depression    post-partum  . Inflammatory polyps of colon [facility])     Past Surgical History:  Procedure Laterality Date  . broken leg  1982   Left Femur, traction and pin in leg    Current Medications: Current Meds  Medication Sig  . ARMOUR THYROID 15 MG tablet Take 15 mg by mouth once a week.   [name] THYROID 60 MG tablet Take 1 tablet by mouth daily.  . Ascorbic Acid (VITAMIN C) 1000 MG tablet Take 1,000 mg by mouth daily.  . citalopram (CELEXA) 20 MG tablet Take 1 tablet (20 mg total) by mouth daily.  . drospirenone-ethinyl estradiol (NIKKI) 3-0.02 MG tablet Take 1 tablet by mouth daily.  . Iodine, Kelp, TABS 12.5mg  three times a week  . Multiple Vitamin (MULTIVITAMIN) tablet Take 1 tablet by mouth daily.  . Omega-3 Fatty Acids (FISH OIL PO) Take by mouth.  . Pregnenolone POWD by Does not apply route. 1.5mg      Allergies:   Penicillins   Social History   Social History  . Marital status: Married    Spouse name: N/A  . Number of children: N/A  . Years of education: N/A   Social History Main Topics  . Smoking status: Never Smoker  . Smokeless tobacco: Never Used  . Alcohol use Yes  . Drug use: No  . Sexual activity: Yes   Other Topics Concern  . None   Social History Narrative  . None     Family History: The patient's ***family history includes Cancer in her father and paternal grandmother; Diabetes in her mother.  ROS:   Please see the history of present illness.    ROS  All other systems reviewed and negative.   EKGs/Labs/Other Studies Reviewed:    The following  studies were reviewed today: ***  EKG:  EKG is *** ordered today.  The ekg ordered today demonstrates ***  Recent Labs: 02/15/2017: ALT 32; BUN 15; Creatinine, Ser 0.67; Hemoglobin 14.2; Platelets 272; Potassium 3.5; Sodium 135; TSH 0.843   Recent Lipid Panel    Component Value Date/Time   CHOL 176 09/17/2016 0837   TRIG 237.0 (H) 09/17/2016 0837   HDL 48.00 09/17/2016 0837   CHOLHDL 4 09/17/2016 0837   VLDL 47.4 (H) 09/17/2016 0837   LDLCALC 132 (H) 09/16/2015 0835   LDLDIRECT 104.0 09/17/2016 0837    Physical Exam:    VS:  BP (!) 118/96   Pulse 71   Ht 5\' 4"  (1.626 m)   Wt 145 lb (65.8 kg)   LMP 02/15/2017   SpO2 95%   BMI 24.89 kg/m     Wt Readings from Last 3 Encounters:  02/18/17 145 lb (65.8 kg)  02/18/17 144 lb (65.3 kg)  02/15/17 145 lb (65.8 kg)     GEN: *** Well nourished, well developed in no acute distress HEENT: Normal NECK: No JVD; No carotid bruits LYMPHATICS: No lymphadenopathy CARDIAC: ***RRR, no murmurs, rubs, gallops RESPIRATORY:  Clear to auscultation without rales, wheezing or  rhonchi  ABDOMEN: Soft, non-tender, non-distended MUSCULOSKELETAL:  No edema; No deformity  SKIN: Warm and dry NEUROLOGIC:  Alert and oriented x 3 PSYCHIATRIC:  Normal affect   ASSESSMENT:    No diagnosis found. PLAN:    In order of problems listed above:  ***   Medication Adjustments/Labs and Tests Ordered: Current medicines are reviewed at length with the patient today.  Concerns regarding medicines are outlined above.  No orders of the defined types were placed in this encounter.  No orders of the defined types were placed in this encounter.   Signed, Fransico Him, MD  02/18/2017 1:30 PM    [place]

## 2017-02-18 NOTE — Progress Notes (Signed)
Pre visit review using our clinic review tool, if applicable. No additional management support is needed unless otherwise documented below in the visit note. 

## 2017-02-18 NOTE — Patient Instructions (Signed)
Please keep on your medication regimen. Add on a nighttime Zantac over-the-counter to help with any reflux that is occurring at night.  Please go to your appointment with Dr. Radford Pax today so we can get a sleep study and stress test set up for you.

## 2017-02-18 NOTE — Patient Instructions (Signed)
Medication Instructions:  Your physician recommends that you continue on your current medications as directed. Please refer to the Current Medication list given to you today.   Labwork: None ordered  Testing/Procedures: Your physician has requested that you have an echocardiogram. Echocardiography is a painless test that uses sound waves to create images of your heart. It provides your doctor with information about the size and shape of your heart and how well your heart's chambers and valves are working. This procedure takes approximately one hour. There are no restrictions for this procedure.  Your physician has requested that you have an exercise tolerance test. For further information please visit [employer]uge[employer]iest[employer]tn. Please also follow instruction sheet, as given.  Your physician has recommended that you wear an event monitor. Event monitors are medical devices that record the heart's electrical activity. Doctors most often [place] these monitors to diagnose arrhythmias. Arrhythmias are problems with the speed or rhythm of the heartbeat. The monitor is a small, portable device. You can wear one while you do your normal daily activities. This is usually used to diagnose what is causing palpitations/syncope (passing out).  Your physician has recommended that you have a sleep study. This test records several body functions during sleep, including: brain activity, eye movement, oxygen and carbon dioxide blood levels, heart rate and rhythm, breathing rate and rhythm, the flow of air through your mouth and nose, snoring, body muscle movements, and chest and belly movement.  Follow-Up: Your physician wants you to follow-up AS NEEDED   Any Other Special Instructions Will Be Listed Below (If Applicable).     If you need a refill on your cardiac medications before your next appointment, please call your pharmacy.

## 2017-02-18 NOTE — Progress Notes (Signed)
Cardiology Office Note    Date:  02/18/2017   ID:  Ann Bass, DOB 18-Apr-1971, MRN [id number]  PCP:  [name], MD  Cardiologist:  Fransico Him, MD   Chief Complaint  Patient presents with  . Palpitations    History of Present Illness:  Ann Bass is a 46 y.o. female who is being seen today for the evaluation of Palpitations at the request of Ann Bass, Ann Millet, MD.  This is a 46yo female with a history of allergies and depression who is referred for evaluation of palpitations.  She says that this started about 6 months ago.  They occur about once weekly and last a few seconds and resolve.  She says that it feels very fast and resolves quickly.  Nothing brings them on and nothing makes them better.  She 2 cups of coffee daily and no soda or tea.  She denies any dizziness, diaphoresis, SOB or CP with the palpitations.  She also has been having SOB for about 6 months as well.  Over the past few months it has become worse.  She notices this going upstairs or walking and talking at the same time.  It also happens when she works out and she has noticed that her fitbit shows that her heart rate does not get any higher than 70-80bpm.  She has had some problems waking up in the night gasping for breath and has been told that she snores.  She feels sleepy when she gets up in the am and has to nap during the day.  She has had some chest discomfort that she describes as a pressure that started have a few episodes apnea and awakening gasping for breath the other night.  She denies any tobacco use and no family history of heart disease or SCD.   Past Medical History:  Diagnosis Date  . Allergy   . Depression    post-partum  . Inflammatory polyps of colon [facility])     Past Surgical History:  Procedure Laterality Date  . broken leg  1982   Left Femur, traction and pin in leg    Current Medications: Current Meds  Medication Sig  . ARMOUR THYROID 15 MG tablet Take 15 mg by  mouth once a week.   [name] THYROID 60 MG tablet Take 1 tablet by mouth daily.  . Ascorbic Acid (VITAMIN C) 1000 MG tablet Take 1,000 mg by mouth daily.  . citalopram (CELEXA) 20 MG tablet Take 1 tablet (20 mg total) by mouth daily.  . drospirenone-ethinyl estradiol (NIKKI) 3-0.02 MG tablet Take 1 tablet by mouth daily.  . Iodine, [place], TABS 12.5mg  three times a week  . Multiple Vitamin (MULTIVITAMIN) tablet Take 1 tablet by mouth daily.  . Omega-3 Fatty Acids (FISH OIL PO) Take by mouth.  . Pregnenolone POWD by Does not apply route. 1.5mg     Allergies:   Penicillins   Social History   Social History  . Marital status: Married    Spouse name: N/A  . Number of children: N/A  . Years of education: N/A   Social History Main Topics  . Smoking status: Never Smoker  . Smokeless tobacco: Never Used  . Alcohol use Yes  . Drug use: No  . Sexual activity: Yes   Other Topics Concern  . None   Social History Narrative  . None     Family History:  The patient's family history includes Cancer in her father and paternal grandmother; Diabetes  in her mother.   ROS:   Please see the history of present illness.    ROS All other systems reviewed and are negative.  PAD Screen 02/18/2017  Previous PAD dx? No  Previous surgical procedure? No  Pain with walking? No  Feet/toe relief with dangling? No  Painful, non-healing ulcers? No  Extremities discolored? No       PHYSICAL EXAM:   VS:  BP (!) 118/96   Pulse 71   Ht 5\' 4"  (1.626 m)   Wt 145 lb (65.8 kg)   LMP 02/15/2017   SpO2 95%   BMI 24.89 kg/m    GEN: Well nourished, well developed, in no acute distress  HEENT: normal  Neck: no JVD, carotid bruits, or masses Cardiac: RRR; no murmurs, rubs, or gallops,no edema.  Intact distal pulses bilaterally.  Respiratory:  clear to auscultation bilaterally, normal work of breathing GI: soft, nontender, nondistended, + BS MS: no deformity or atrophy  Skin: warm and dry, no  rash Neuro:  Alert and Oriented x 3, Strength and sensation are intact Psych: euthymic mood, full affect  Wt Readings from Last 3 Encounters:  02/18/17 145 lb (65.8 kg)  02/18/17 144 lb (65.3 kg)  02/15/17 145 lb (65.8 kg)      Studies/Labs Reviewed:   EKG:  EKG is not ordered today.    Recent Labs: 02/15/2017: ALT 32; BUN 15; Creatinine, Ser 0.67; Hemoglobin 14.2; Platelets 272; Potassium 3.5; Sodium 135; TSH 0.843   Lipid Panel    Component Value Date/Time   CHOL 176 09/17/2016 0837   TRIG 237.0 (H) 09/17/2016 0837   HDL 48.00 09/17/2016 0837   CHOLHDL 4 09/17/2016 0837   VLDL 47.4 (H) 09/17/2016 0837   LDLCALC 132 (H) 09/16/2015 0835   LDLDIRECT 104.0 09/17/2016 0837    Additional studies/ records that were reviewed today include:  Office notes of PCP    ASSESSMENT:    1. Palpitations   2. SOB (shortness of breath)   3. Excessive daytime sleepiness      PLAN:  In order of problems listed above:  1. Palpitations - these are very short lived but do not occur everyday.  EKG is normal.  I will get an event monitor to assess further.  If normal her PCP may want to cut back on her thyroid meds a little to see if symptoms improve as her TSH is on the low end.   2.  SOB - ? Etiology.  She is concerned that her HR does not get up with exercise.  She is not on any meds that would suppress HR.  She really has no CRFs for CAD.  I will get an ETT to assess her chronotropic response to exercise and check a 2D echo.    3.  Excessive Daytime sleepiness - I will get a sleep study to rule out OSA.    Medication Adjustments/Labs and Tests Ordered: Current medicines are reviewed at length with the patient today.  Concerns regarding medicines are outlined above.  Medication changes, Labs and Tests ordered today are listed in the Patient Instructions below.  There are no Patient Instructions on file for this visit.   Signed, Fransico Him, MD  02/18/2017 1:50 PM    [facility] [place], [place], [place]  [address] Phone: [phone]; Fax: [phone]

## 2017-02-19 ENCOUNTER — Telehealth: Payer: Self-pay | Admitting: *Deleted

## 2017-02-19 DIAGNOSIS — M21611 Bunion of right foot: Secondary | ICD-10-CM | POA: Diagnosis not present

## 2017-02-19 DIAGNOSIS — G4719 Other hypersomnia: Secondary | ICD-10-CM

## 2017-02-19 DIAGNOSIS — M7741 Metatarsalgia, right foot: Secondary | ICD-10-CM | POA: Diagnosis not present

## 2017-02-19 NOTE — Telephone Encounter (Signed)
Informed patient of upcoming sleep study and patient understanding was verbalized. Patient understands her sleep study is scheduled for Wednesday April 14 2017. Patient understands her sleep study will be done at [facility] sleep lab. Patient understands she will receive a sleep packet in a week or so. Patient understands to call if she does not receive the sleep packet in a timely manner. Patient agrees with treatment and thanked me for call.

## 2017-02-19 NOTE — Telephone Encounter (Signed)
-----   Message from Cleon Gustin, [place] sent at 02/18/2017  2:09 PM EDT ----- Regarding: Sleep Study Per Dr. Radford Pax, patient needs sleep study for excessive daytime sleepiness. ESS= 11. Thanks

## 2017-03-05 ENCOUNTER — Ambulatory Visit (INDEPENDENT_AMBULATORY_CARE_PROVIDER_SITE_OTHER): Payer: BLUE CROSS/BLUE SHIELD

## 2017-03-05 ENCOUNTER — Other Ambulatory Visit: Payer: Self-pay | Admitting: Cardiology

## 2017-03-05 ENCOUNTER — Encounter: Payer: Self-pay | Admitting: Cardiology

## 2017-03-05 ENCOUNTER — Other Ambulatory Visit: Payer: Self-pay

## 2017-03-05 ENCOUNTER — Ambulatory Visit (HOSPITAL_COMMUNITY): Payer: BLUE CROSS/BLUE SHIELD | Attending: Cardiovascular Disease

## 2017-03-05 DIAGNOSIS — R0602 Shortness of breath: Secondary | ICD-10-CM

## 2017-03-05 DIAGNOSIS — I712 Thoracic aortic aneurysm, without rupture: Secondary | ICD-10-CM | POA: Insufficient documentation

## 2017-03-05 DIAGNOSIS — R002 Palpitations: Secondary | ICD-10-CM | POA: Insufficient documentation

## 2017-03-05 DIAGNOSIS — I77810 Thoracic aortic ectasia: Secondary | ICD-10-CM | POA: Insufficient documentation

## 2017-03-05 LAB — EXERCISE TOLERANCE TEST
CHL CUP RESTING HR STRESS: 65 {beats}/min
CSEPEDS: 0 s
CSEPEW: 13.4 METS
CSEPHR: 86 %
CSEPPHR: 151 {beats}/min
Exercise duration (min): 11 min
MPHR: 174 {beats}/min
RPE: 17

## 2017-03-08 ENCOUNTER — Telehealth: Payer: Self-pay

## 2017-03-08 DIAGNOSIS — I77810 Thoracic aortic ectasia: Secondary | ICD-10-CM

## 2017-03-08 NOTE — Telephone Encounter (Signed)
Informed patient of results and verbal understanding expressed.  Limited echo ordered to be scheduled in 1 year. Patient agrees with treatment plan.

## 2017-03-10 ENCOUNTER — Ambulatory Visit: Payer: BLUE CROSS/BLUE SHIELD | Admitting: Cardiology

## 2017-03-30 DIAGNOSIS — Z23 Encounter for immunization: Secondary | ICD-10-CM | POA: Diagnosis not present

## 2017-04-01 DIAGNOSIS — R5381 Other malaise: Secondary | ICD-10-CM | POA: Diagnosis not present

## 2017-04-01 DIAGNOSIS — E049 Nontoxic goiter, unspecified: Secondary | ICD-10-CM | POA: Diagnosis not present

## 2017-04-01 DIAGNOSIS — N949 Unspecified condition associated with female genital organs and menstrual cycle: Secondary | ICD-10-CM | POA: Diagnosis not present

## 2017-04-14 ENCOUNTER — Encounter (HOSPITAL_BASED_OUTPATIENT_CLINIC_OR_DEPARTMENT_OTHER): Payer: BLUE CROSS/BLUE SHIELD

## 2017-04-15 ENCOUNTER — Telehealth: Payer: Self-pay | Admitting: *Deleted

## 2017-04-15 NOTE — Telephone Encounter (Signed)
-----   Message from Almyra Free sent at 04/09/2017 12:48 PM EST ----- Regarding: FW: Okay to switch to home study? Per Dr. Radford Pax, okay to switch to home study. In lab scheduled for 11/28 to be canceled. Thanks,  Amy ----- Message ----- From: Sueanne Margarita, MD Sent: 04/01/2017   2:50 PM To: Amy Markus Jarvis Subject: RE: Faythe Ghee to switch to home study?              yes ----- Message ----- From: Almyra Free Sent: 04/01/2017   2:18 PM To: Sueanne Margarita, MD Subject: Faythe Ghee to switch to home study?                  Pt does not meet in lab criteria per insurance. Is it okay to switch to home study? Thank you, Amy

## 2017-04-15 NOTE — Telephone Encounter (Signed)
Home Sleep Study submitted into Epic.  Informed patient of upcoming home sleep study and patient understanding was verbalized. Patient understands her sleep study is being done through [employer]. Patient understands she will be contacted her DME to set up her cpap. She understands to call if her DME does not contact her with new setup in a timely manner. She understands she will be called once confirmation has been received from her DME that she has received her new machine to schedule 10 week follow up appointment.  DME will be notified of new cpap order in epic Please add to Ann Bass She was grateful for the call and thanked me

## 2017-04-21 ENCOUNTER — Institutional Professional Consult (permissible substitution): Payer: BLUE CROSS/BLUE SHIELD | Admitting: Pulmonary Disease

## 2017-04-22 NOTE — Telephone Encounter (Signed)
Ann Bass, CMA        HST Authorized. #[id number]. Valid 04/21/2017-06/19/2017.  Thanks,  Amy

## 2017-05-19 ENCOUNTER — Telehealth: Payer: Self-pay | Admitting: *Deleted

## 2017-05-19 NOTE — Telephone Encounter (Signed)
-----   Message from Sueanne Margarita, MD sent at 05/13/2017 11:36 PM EST ----- Home sleep study showed mild OSA.  Patient has problems with excessive daytime sleepiness so please set up on a ResMed CPAP with heated humidity with autotitration from 4 to 18cm H2O for 2 weeks and then followup with me in 10 weeks.

## 2017-05-19 NOTE — Telephone Encounter (Signed)
Informed patient of sleep study results and patient understanding was verbalized. Patient understands her sleep study showed mild sleep apnea. Patient agrees she has problems with excessive daytime sleepiness. Patient understands Dr Radford Pax has ordered her a CPAP with an auto titration for 2 weeks and then follow up with Dr Radford Pax in 10 weeks. Patient understands she will be contacted by [facility] to set up her CPAP and auto titration. She understands to call if [facility] does not contact her with new setup in a timely manner. She understands she will be called once confirmation has been received from [facility] that she has received her new machine to schedule 10 week follow up appointment.  [facility] notified of new cpap order Please add to airview She was grateful for the call and thanked me

## 2017-06-22 DIAGNOSIS — G4733 Obstructive sleep apnea (adult) (pediatric): Secondary | ICD-10-CM | POA: Diagnosis not present

## 2017-07-20 DIAGNOSIS — G4733 Obstructive sleep apnea (adult) (pediatric): Secondary | ICD-10-CM | POA: Diagnosis not present

## 2017-08-03 NOTE — Telephone Encounter (Addendum)
Patient has a 10 week follow up appointment scheduled for September 02 2017.Ann Bass Patient understands she needs to keep this appointment for insurance compliance. Patient was grateful for the call and thanked me.

## 2017-08-14 ENCOUNTER — Other Ambulatory Visit: Payer: Self-pay | Admitting: Family Medicine

## 2017-08-20 DIAGNOSIS — G4733 Obstructive sleep apnea (adult) (pediatric): Secondary | ICD-10-CM | POA: Diagnosis not present

## 2017-08-26 DIAGNOSIS — E049 Nontoxic goiter, unspecified: Secondary | ICD-10-CM | POA: Diagnosis not present

## 2017-08-26 DIAGNOSIS — N951 Menopausal and female climacteric states: Secondary | ICD-10-CM | POA: Diagnosis not present

## 2017-08-26 DIAGNOSIS — R5381 Other malaise: Secondary | ICD-10-CM | POA: Diagnosis not present

## 2017-08-26 LAB — HEPATIC FUNCTION PANEL
ALK PHOS: 48 (ref 25–125)
ALT: 28 (ref 7–35)
AST: 43 — AB (ref 13–35)
Bilirubin, Total: 0.6

## 2017-08-26 LAB — CBC AND DIFFERENTIAL
HCT: 43 (ref 36–46)
Hemoglobin: 14.9 (ref 12.0–16.0)
NEUTROS ABS: 3888
Platelets: 245 (ref 150–399)

## 2017-08-26 LAB — BASIC METABOLIC PANEL
BUN: 13 (ref 4–21)
Creatinine: 0.6 (ref 0.5–1.1)
Glucose: 94
POTASSIUM: 4.3 (ref 3.4–5.3)
Sodium: 140 (ref 137–147)

## 2017-08-26 LAB — LIPID PANEL
Cholesterol: 185 (ref 0–200)
HDL: 39 (ref 35–70)
LDL Cholesterol: 119
TRIGLYCERIDES: 155 (ref 40–160)

## 2017-08-26 LAB — TSH: TSH: 1.07 (ref 0.41–5.90)

## 2017-09-02 ENCOUNTER — Ambulatory Visit (INDEPENDENT_AMBULATORY_CARE_PROVIDER_SITE_OTHER): Payer: BLUE CROSS/BLUE SHIELD | Admitting: Cardiology

## 2017-09-02 ENCOUNTER — Telehealth: Payer: Self-pay | Admitting: *Deleted

## 2017-09-02 ENCOUNTER — Encounter: Payer: Self-pay | Admitting: Cardiology

## 2017-09-02 VITALS — BP 118/80 | HR 69 | Ht 64.0 in | Wt 145.8 lb

## 2017-09-02 DIAGNOSIS — I77810 Thoracic aortic ectasia: Secondary | ICD-10-CM

## 2017-09-02 DIAGNOSIS — G4733 Obstructive sleep apnea (adult) (pediatric): Secondary | ICD-10-CM | POA: Diagnosis not present

## 2017-09-02 DIAGNOSIS — R002 Palpitations: Secondary | ICD-10-CM | POA: Diagnosis not present

## 2017-09-02 HISTORY — DX: Obstructive sleep apnea (adult) (pediatric): G47.33

## 2017-09-02 NOTE — Telephone Encounter (Signed)
Set CPAP at 6 cm water pressure and get 2 week D/L in 2 weeks Order placed to [facility]

## 2017-09-02 NOTE — Progress Notes (Signed)
Cardiology Office Note:    Date:  09/02/2017   ID:  Ann Bass, DOB [birth year], MRN [id number]  PCP:  Ann Minium, MD  Cardiologist:  No primary care provider on file.    Referring MD: Ann Minium, MD   Chief Complaint  Patient presents with  . Follow-up    dilated aortic root, OSA    History of Present Illness:    Ann Bass is a 47 y.o. female with a hx of palpitations and shortness of breath.  2D echocardiogram showed normal LV function with mildly dilated a sending aorta at 39 mmHg otherwise normal 2D echocardiogram.  Exercise stress test noted showed no inducible ischemia.  Event monitor was normal except for a rare PAC.    She also complained of excessive daytime sleepiness at that time and underwent sleep study.  She underwent home sleep study showing mild obstructive sleep apnea with an AHI of 6.8/hr.  Lowest oxygen saturation drop was 82%.  She was set up on a ResMed CPAP with an auto titration 4-18 cm of water pressure for 2 weeks.  This was done in January.  He is now here for her 10-week follow-up.She is doing well with her CPAP device and thinks that she has gotten used to it.  She tolerates the mask and feels the pressure is adequate.  Since going on CPAP she feels more rested in the am and has no significant daytime sleepiness.  He did take her a while to get used to the mask but she thinks is getting better each day.  She denies any significant mouth or nasal dryness or nasal congestion.  She does not think that he snores.     Past Medical History:  Diagnosis Date  . Allergy   . Depression    post-partum  . Dilated aortic root ([place])    12mm by echo 2018  . Inflammatory polyps of colon ([place])   . OSA (obstructive sleep apnea) 09/02/2017   Mild OSA with an AHI of 6.8/h.  On CPAP  . Palpitations    PACs noted on event monitor    Past Surgical History:  Procedure Laterality Date  . broken leg  1982   Left Femur, traction and pin in leg     Current Medications: Current Meds  Medication Sig  . ARMOUR THYROID 15 MG tablet Take 15 mg by mouth once a week.   Ann Bass THYROID 60 MG tablet Take 1 tablet by mouth daily.  . Ascorbic Acid (VITAMIN C) 1000 MG tablet Take 1,000 mg by mouth daily.  . citalopram (CELEXA) 20 MG tablet TAKE 1 TABLET BY MOUTH EVERY DAY  . drospirenone-ethinyl estradiol (NIKKI) 3-0.02 MG tablet Take 1 tablet by mouth daily.  . Multiple Vitamin (MULTIVITAMIN) tablet Take 1 tablet by mouth daily.  . Omega-3 Fatty Acids (FISH OIL PO) Take by mouth.  . Pregnenolone POWD by Does not apply route. 1.5mg      Allergies:   Penicillins   Social History   Socioeconomic History  . Marital status: Married    Spouse name: Not on file  . Number of children: Not on file  . Years of education: Not on file  . Highest education level: Not on file  Occupational History  . Not on file  Social Needs  . Financial resource strain: Not on file  . Food insecurity:    Worry: Not on file    Inability: Not on file  . Transportation needs:  Medical: Not on file    Non-medical: Not on file  Tobacco Use  . Smoking status: Never Smoker  . Smokeless tobacco: Never Used  Substance and Sexual Activity  . Alcohol use: Yes  . Drug use: No  . Sexual activity: Yes  Lifestyle  . Physical activity:    Days per week: Not on file    Minutes per session: Not on file  . Stress: Not on file  Relationships  . Social connections:    Talks on phone: Not on file    Gets together: Not on file    Attends religious service: Not on file    Active member of club or organization: Not on file    Attends meetings of clubs or organizations: Not on file    Relationship status: Not on file  Other Topics Concern  . Not on file  Social History Narrative  . Not on file     Family History: The patient's family history includes Cancer in her father and paternal grandmother; Diabetes in her mother.  ROS:   Please see the history of  present illness.    ROS  All other systems reviewed and negative.   EKGs/Labs/Other Studies Reviewed:    The following studies were reviewed today: PAP download  EKG:  EKG is not ordered today.    Recent Labs: 02/15/2017: ALT 32; BUN 15; Creatinine, Ser 0.67; Hemoglobin 14.2; Platelets 272; Potassium 3.5; Sodium 135; TSH 0.843   Recent Lipid Panel    Component Value Date/Time   CHOL 176 09/17/2016 0837   TRIG 237.0 (H) 09/17/2016 0837   HDL 48.00 09/17/2016 0837   CHOLHDL 4 09/17/2016 0837   VLDL 47.4 (H) 09/17/2016 0837   LDLCALC 132 (H) 09/16/2015 0835   LDLDIRECT 104.0 09/17/2016 0837    Physical Exam:    VS:  BP 118/80   Pulse 69   Ht 5\' 4"  (1.626 m)   Wt 145 lb 12.8 oz (66.1 kg)   SpO2 95%   BMI 25.03 kg/m     Wt Readings from Last 3 Encounters:  09/02/17 145 lb 12.8 oz (66.1 kg)  02/18/17 145 lb (65.8 kg)  02/18/17 144 lb (65.3 kg)     GEN:  Well nourished, well developed in no acute distress HEENT: Normal NECK: No JVD; No carotid bruits LYMPHATICS: No lymphadenopathy CARDIAC: RRR, no murmurs, rubs, gallops RESPIRATORY:  Clear to auscultation without rales, wheezing or rhonchi  ABDOMEN: Soft, non-tender, non-distended MUSCULOSKELETAL:  No edema; No deformity  SKIN: Warm and dry NEUROLOGIC:  Alert and oriented x 3 PSYCHIATRIC:  Normal affect   ASSESSMENT:    1. Dilated aortic root (HCC)   2. OSA (obstructive sleep apnea)   3. Palpitations    PLAN:    In order of problems listed above:  1.  Dilated aortic root  -echo 03/05/2017 showed aortic root of 39 mm.  I will repeat a 2D echocardiogram 02/2018 to make sure this is not progressed.  2.  OSA - the patient is tolerating PAP therapy well without any problems. The PAP download was reviewed today and showed an AHI of 0.1/hr on auto CPAP with 97% compliance in using more than 4 hours nightly.  The patient has been using and benefiting from PAP use and will continue to benefit from therapy.  Her 95th  percentile pressure was 6.3 and I will set her on 6 cm of water pressure today.  I will get a download in 2 weeks.  3.  Palpitations - She continues to occasionally have some palpitations which she described as skipped beats.  Her event monitor that she wore last year showed only PACs.  Medication Adjustments/Labs and Tests Ordered: Current medicines are reviewed at length with the patient today.  Concerns regarding medicines are outlined above.  No orders of the defined types were placed in this encounter.  No orders of the defined types were placed in this encounter.   Signed, Fransico Him, MD  09/02/2017 2:26 PM    [place]

## 2017-09-02 NOTE — Telephone Encounter (Signed)
-----   Message from Teressa Senter, RN sent at 09/02/2017  2:33 PM EDT ----- Regarding: dme order DME order placed   Thanks  Rena

## 2017-09-02 NOTE — Patient Instructions (Signed)
Medication Instructions:  Your physician recommends that you continue on your current medications as directed. Please refer to the Current Medication list given to you today.  If you need a refill on your cardiac medications, please contact your pharmacy first.  Labwork: None ordered   Testing/Procedures: None ordered   Follow-Up: Your physician wants you to follow-up in: 1 year with Dr. Turner. You will receive a reminder letter in the mail two months in advance. If you don't receive a letter, please call our office to schedule the follow-up appointment.  Any Other Special Instructions Will Be Listed Below (If Applicable).   Thank you for choosing [facility]    Rena Kaniah Rizzolo, RN  [phone]  If you need a refill on your cardiac medications before your next appointment, please call your pharmacy.   

## 2017-09-08 DIAGNOSIS — R5383 Other fatigue: Secondary | ICD-10-CM | POA: Diagnosis not present

## 2017-09-08 DIAGNOSIS — E039 Hypothyroidism, unspecified: Secondary | ICD-10-CM | POA: Diagnosis not present

## 2017-09-08 DIAGNOSIS — N949 Unspecified condition associated with female genital organs and menstrual cycle: Secondary | ICD-10-CM | POA: Diagnosis not present

## 2017-09-21 ENCOUNTER — Other Ambulatory Visit: Payer: Self-pay

## 2017-09-21 ENCOUNTER — Ambulatory Visit (INDEPENDENT_AMBULATORY_CARE_PROVIDER_SITE_OTHER): Payer: BLUE CROSS/BLUE SHIELD | Admitting: Family Medicine

## 2017-09-21 ENCOUNTER — Encounter: Payer: Self-pay | Admitting: General Practice

## 2017-09-21 ENCOUNTER — Encounter: Payer: Self-pay | Admitting: Family Medicine

## 2017-09-21 VITALS — BP 121/84 | HR 66 | Temp 98.5°F | Resp 16 | Ht 64.0 in | Wt 142.5 lb

## 2017-09-21 DIAGNOSIS — E559 Vitamin D deficiency, unspecified: Secondary | ICD-10-CM | POA: Diagnosis not present

## 2017-09-21 DIAGNOSIS — Z0000 Encounter for general adult medical examination without abnormal findings: Secondary | ICD-10-CM | POA: Diagnosis not present

## 2017-09-21 DIAGNOSIS — E039 Hypothyroidism, unspecified: Secondary | ICD-10-CM

## 2017-09-21 LAB — HEPATIC FUNCTION PANEL
ALT: 62 U/L — ABNORMAL HIGH (ref 0–35)
AST: 42 U/L — AB (ref 0–37)
Albumin: 4.4 g/dL (ref 3.5–5.2)
Alkaline Phosphatase: 44 U/L (ref 39–117)
Bilirubin, Direct: 0.1 mg/dL (ref 0.0–0.3)
Total Bilirubin: 0.6 mg/dL (ref 0.2–1.2)
Total Protein: 6.9 g/dL (ref 6.0–8.3)

## 2017-09-21 LAB — VITAMIN D 25 HYDROXY (VIT D DEFICIENCY, FRACTURES): VITD: 74.79 ng/mL (ref 30.00–100.00)

## 2017-09-21 NOTE — Assessment & Plan Note (Signed)
Pt's PE unchanged from previous and WNL w/ exception of mild thyromegaly.  Reviewed recent labs from homeopath.  UTD on pap, mammo.  Anticipatory guidance provided.

## 2017-09-21 NOTE — Assessment & Plan Note (Signed)
Check labs and replete prn. 

## 2017-09-21 NOTE — Progress Notes (Signed)
   Subjective:    Patient ID: Ann Bass, female    DOB: 05-Nov-1970, 47 y.o.   MRN: [id number]  HPI CPE- UTD on pap, mammo, Tdap.  Pt is also seeing a 'homeopath' and has labs q4 months.   Review of Systems Patient reports no vision/ hearing changes, adenopathy,fever, weight change,  persistant/recurrent hoarseness , swallowing issues, chest pain, palpitations, edema, persistant/recurrent cough, hemoptysis, dyspnea (rest/exertional/paroxysmal nocturnal), gastrointestinal bleeding (melena, rectal bleeding), abdominal pain, significant heartburn, bowel changes, GU symptoms (dysuria, hematuria, incontinence), Gyn symptoms (abnormal  bleeding, pain),  syncope, focal weakness, memory loss, numbness & tingling, skin/hair/nail changes, abnormal bruising or bleeding, anxiety, or depression.     Objective:   Physical Exam General Appearance:    Alert, cooperative, no distress, appears stated age  Head:    Normocephalic, without obvious abnormality, atraumatic  Eyes:    PERRL, conjunctiva/corneas clear, EOM's intact, fundi    benign, both eyes  Ears:    Normal TM's and external ear canals, both ears  Nose:   Nares normal, septum midline, mucosa normal, no drainage    or sinus tenderness  Throat:   Lips, mucosa, and tongue normal; teeth and gums normal  Neck:   Supple, symmetrical, trachea midline, no adenopathy;    Thyroid: mild thyromegaly w/o nodularity  Back:     Symmetric, no curvature, ROM normal, no CVA tenderness  Lungs:     Clear to auscultation bilaterally, respirations unlabored  Chest Wall:    No tenderness or deformity   Heart:    Regular rate and rhythm, S1 and S2 normal, no murmur, rub   or gallop  Breast Exam:    Deferred to GYN  Abdomen:     Soft, non-tender, bowel sounds active all four quadrants,    no masses, no organomegaly  Genitalia:    Deferred to GYN  Rectal:    Extremities:   Extremities normal, atraumatic, no cyanosis or edema  Pulses:   2+ and symmetric all  extremities  Skin:   Skin color, texture, turgor normal, no rashes or lesions  Lymph nodes:   Cervical, supraclavicular, and axillary nodes normal  Neurologic:   CNII-XII intact, normal strength, sensation and reflexes    throughout          Assessment & Plan:

## 2017-09-21 NOTE — Patient Instructions (Signed)
Follow up in 1 year or as needed We'll notify you of your lab results and make any changes if needed Continue to work on healthy diet and regular exercise- you look great! Call with any questions or concerns Happy Mother's Day!!

## 2017-09-21 NOTE — Assessment & Plan Note (Signed)
Reviewed recent labs- no changes at this time

## 2017-09-22 ENCOUNTER — Other Ambulatory Visit: Payer: Self-pay | Admitting: Family Medicine

## 2017-09-22 DIAGNOSIS — R945 Abnormal results of liver function studies: Principal | ICD-10-CM

## 2017-09-22 DIAGNOSIS — R7989 Other specified abnormal findings of blood chemistry: Secondary | ICD-10-CM

## 2017-09-30 ENCOUNTER — Ambulatory Visit (HOSPITAL_BASED_OUTPATIENT_CLINIC_OR_DEPARTMENT_OTHER): Payer: BLUE CROSS/BLUE SHIELD

## 2017-10-01 ENCOUNTER — Telehealth: Payer: Self-pay | Admitting: *Deleted

## 2017-10-01 NOTE — Telephone Encounter (Signed)
-----   Message from Sueanne Margarita, MD sent at 09/23/2017  4:59 PM EDT ----- Good AHI and compliance.  Continue current PAP settings.

## 2017-10-01 NOTE — Telephone Encounter (Signed)
-----   Message from Sueanne Margarita, MD sent at 09/28/2017  8:49 AM EDT ----- AHI is too high on download.  Please get a 2-week auto titration from 4 to 18 cm H2O

## 2017-10-01 NOTE — Telephone Encounter (Signed)
Patient is aware and agreeable to AHI being within range at 3.4. Patient is aware and agreeable to being in compliance with machine usage Patient is aware and agreeable to no change in current pressures.

## 2017-10-01 NOTE — Telephone Encounter (Signed)
Printed compliance report 5/16 that has patients AHI at 3.5. Compliance report period is 4/17-5-16. Can you review for me and advise on any recommendations if needed.

## 2017-10-06 ENCOUNTER — Encounter: Payer: Self-pay | Admitting: Family Medicine

## 2017-10-06 ENCOUNTER — Ambulatory Visit (HOSPITAL_BASED_OUTPATIENT_CLINIC_OR_DEPARTMENT_OTHER)
Admission: RE | Admit: 2017-10-06 | Discharge: 2017-10-06 | Disposition: A | Discharge status: Home / Self Care | Payer: BLUE CROSS/BLUE SHIELD | Source: Ambulatory Visit | Attending: Family Medicine | Admitting: Family Medicine

## 2017-10-06 DIAGNOSIS — R7989 Other specified abnormal findings of blood chemistry: Secondary | ICD-10-CM | POA: Diagnosis not present

## 2017-10-06 DIAGNOSIS — R932 Abnormal findings on diagnostic imaging of liver and biliary tract: Secondary | ICD-10-CM | POA: Diagnosis not present

## 2017-10-06 DIAGNOSIS — R945 Abnormal results of liver function studies: Secondary | ICD-10-CM

## 2017-10-07 ENCOUNTER — Other Ambulatory Visit: Payer: Self-pay | Admitting: Family Medicine

## 2017-10-07 DIAGNOSIS — R935 Abnormal findings on diagnostic imaging of other abdominal regions, including retroperitoneum: Secondary | ICD-10-CM

## 2017-10-07 DIAGNOSIS — R945 Abnormal results of liver function studies: Secondary | ICD-10-CM

## 2017-10-14 NOTE — Telephone Encounter (Signed)
Gave patient recommended changes made by Dr. Radford Pax.  Left detailed message on voicemail  and informed patient to call back .

## 2017-10-15 ENCOUNTER — Encounter: Payer: Self-pay | Admitting: Family Medicine

## 2017-10-15 NOTE — Telephone Encounter (Signed)
Auto titration done will get download in 2 weeks.

## 2017-10-28 ENCOUNTER — Ambulatory Visit
Admission: RE | Admit: 2017-10-28 | Discharge: 2017-10-28 | Disposition: A | Discharge status: Home / Self Care | Payer: BLUE CROSS/BLUE SHIELD | Source: Ambulatory Visit | Attending: Family Medicine | Admitting: Family Medicine

## 2017-10-28 DIAGNOSIS — K760 Fatty (change of) liver, not elsewhere classified: Secondary | ICD-10-CM | POA: Diagnosis not present

## 2017-10-28 DIAGNOSIS — R945 Abnormal results of liver function studies: Secondary | ICD-10-CM

## 2017-10-28 DIAGNOSIS — R935 Abnormal findings on diagnostic imaging of other abdominal regions, including retroperitoneum: Secondary | ICD-10-CM

## 2017-10-28 MED ORDER — GADOBENATE DIMEGLUMINE 529 MG/ML IV SOLN
13.0000 mL | Freq: Once | INTRAVENOUS | Status: AC | PRN
  Administered 2017-10-28: 13 mL via INTRAVENOUS

## 2017-10-29 NOTE — Progress Notes (Signed)
Called pt and lmovm to return call.  Need to know if ok to place referral?   [place] for [facility] to Discuss results / PCP recommendations / Schedule patient.

## 2017-10-30 ENCOUNTER — Encounter: Payer: Self-pay | Admitting: Family Medicine

## 2017-11-01 ENCOUNTER — Other Ambulatory Visit: Payer: Self-pay | Admitting: Family Medicine

## 2017-11-01 DIAGNOSIS — K760 Fatty (change of) liver, not elsewhere classified: Secondary | ICD-10-CM

## 2017-11-03 NOTE — Telephone Encounter (Signed)
No note needed/ error 

## 2017-11-07 ENCOUNTER — Encounter: Payer: Self-pay | Admitting: Family Medicine

## 2017-11-08 MED ORDER — CITALOPRAM HYDROBROMIDE 20 MG PO TABS: 20.0000 mg | ORAL_TABLET | Freq: Every day | ORAL | 0 refills | Status: DC

## 2017-11-13 ENCOUNTER — Other Ambulatory Visit: Payer: Self-pay | Admitting: Family Medicine

## 2017-12-01 ENCOUNTER — Encounter: Payer: Self-pay | Admitting: Family Medicine

## 2017-12-03 MED ORDER — ARMOUR THYROID 15 MG PO TABS: 15.0000 mg | ORAL_TABLET | ORAL | 1 refills | Status: DC

## 2017-12-03 MED ORDER — ARMOUR THYROID 60 MG PO TABS: 60.0000 mg | ORAL_TABLET | Freq: Every day | ORAL | 1 refills | Status: DC

## 2018-02-17 ENCOUNTER — Other Ambulatory Visit: Payer: Self-pay | Admitting: Family Medicine

## 2018-03-03 ENCOUNTER — Encounter: Payer: Self-pay | Admitting: Family Medicine

## 2018-03-03 MED ORDER — ARMOUR THYROID 60 MG PO TABS: 60.0000 mg | ORAL_TABLET | Freq: Every day | ORAL | 1 refills | Status: DC

## 2018-03-03 MED ORDER — ARMOUR THYROID 15 MG PO TABS: 15.0000 mg | ORAL_TABLET | ORAL | 1 refills | Status: DC

## 2018-05-15 ENCOUNTER — Other Ambulatory Visit: Payer: Self-pay | Admitting: Family Medicine

## 2018-06-09 DIAGNOSIS — Z6826 Body mass index (BMI) 26.0-26.9, adult: Secondary | ICD-10-CM | POA: Diagnosis not present

## 2018-06-09 DIAGNOSIS — Z01419 Encounter for gynecological examination (general) (routine) without abnormal findings: Secondary | ICD-10-CM | POA: Diagnosis not present

## 2018-06-09 DIAGNOSIS — Z3040 Encounter for surveillance of contraceptives, unspecified: Secondary | ICD-10-CM | POA: Diagnosis not present

## 2018-06-09 DIAGNOSIS — D259 Leiomyoma of uterus, unspecified: Secondary | ICD-10-CM | POA: Diagnosis not present

## 2018-06-09 DIAGNOSIS — Z1231 Encounter for screening mammogram for malignant neoplasm of breast: Secondary | ICD-10-CM | POA: Diagnosis not present

## 2018-06-09 LAB — HM MAMMOGRAPHY: HM Mammogram: NORMAL (ref 0–4)

## 2018-06-09 LAB — HM PAP SMEAR

## 2018-06-16 ENCOUNTER — Encounter: Payer: Self-pay | Admitting: General Practice

## 2018-08-07 ENCOUNTER — Other Ambulatory Visit: Payer: Self-pay | Admitting: Family Medicine

## 2018-09-23 ENCOUNTER — Encounter: Payer: BLUE CROSS/BLUE SHIELD | Admitting: Family Medicine

## 2018-09-27 ENCOUNTER — Telehealth: Payer: Self-pay | Admitting: Cardiology

## 2018-09-27 NOTE — Telephone Encounter (Signed)
Pt. Has smart phone    Virtual Visit Pre-Appointment Phone Call  "(Name), I am calling you today to discuss your upcoming appointment. We are currently trying to limit exposure to the virus that causes COVID-19 by seeing patients at home rather than in the office."  1. "What is the BEST phone number to call the day of the visit?" - include this in appointment notes  2. Do you have or have access to (through a family member/friend) a smartphone with video capability that we can use for your visit?" a. If yes - list this number in appt notes as cell (if different from BEST phone #) and list the appointment type as a VIDEO visit in appointment notes b. If no - list the appointment type as a PHONE visit in appointment notes  3. Confirm consent - "In the setting of the current Covid19 crisis, you are scheduled for a (phone or video) visit with your provider on (date) at (time).  Just as we do with many in-office visits, in order for you to participate in this visit, we must obtain consent.  If you'd like, I can send this to your mychart (if signed up) or email for you to review.  Otherwise, I can obtain your verbal consent now.  All virtual visits are billed to your insurance company just like a normal visit would be.  By agreeing to a virtual visit, we'd like you to understand that the technology does not allow for your provider to perform an examination, and thus may limit your provider's ability to fully assess your condition. If your provider identifies any concerns that need to be evaluated in person, we will make arrangements to do so.  Finally, though the technology is pretty good, we cannot assure that it will always work on either your or our end, and in the setting of a video visit, we may have to convert it to a phone-only visit.  In either situation, we cannot ensure that we have a secure connection.  Are you willing to proceed?"  YES  4. Advise patient to be prepared - "Two hours prior to  your appointment, go ahead and check your blood pressure, pulse, oxygen saturation, and your weight (if you have the equipment to check those) and write them all down. When your visit starts, your provider will ask you for this information. If you have an Apple Watch or Kardia device, please plan to have heart rate information ready on the day of your appointment. Please have a pen and paper handy nearby the day of the visit as well."  5. Give patient instructions for MyChart download to smartphone OR Doximity/Doxy.me as below if video visit (depending on what platform provider is using)  6. Inform patient they will receive a phone call 15 minutes prior to their appointment time (may be from unknown caller ID) so they should be prepared to answer    TELEPHONE CALL NOTE  [name] has been deemed a candidate for a follow-up tele-health visit to limit community exposure during the Covid-19 pandemic. I spoke with the patient via phone to ensure availability of phone/video source, confirm preferred email & phone number, and discuss instructions and expectations.  I reminded SUSSAN METER to be prepared with any vital sign and/or heart rhythm information that could potentially be obtained via home monitoring, at the time of her visit. I reminded ANDROMEDA POPPEN to expect a phone call prior to her visit.  Minus Liberty 09/27/2018 4:27  PM   INSTRUCTIONS FOR DOWNLOADING THE MYCHART APP TO SMARTPHONE  - The patient must first make sure to have activated MyChart and know their login information - If Apple, go to [employer] and type in MyChart in the search bar and download the app. If Android, ask patient to go to [employer] and type in [place] in the search bar and download the app. The app is free but as with any other app downloads, their phone may require them to verify saved payment information or Apple/Android password.  - The patient will need to then log into the app with their  MyChart username and password, and select [facility] as their healthcare provider to link the account. When it is time for your visit, go to the MyChart app, find appointments, and click Begin Video Visit. Be sure to Select Allow for your device to access the Microphone and Camera for your visit. You will then be connected, and your provider will be with you shortly.  **If they have any issues connecting, or need assistance please contact MyChart service desk [phone])**  **If using a computer, in order to ensure the best quality for their visit they will need to use either of the following Internet Browsers: [employer], or Google Chrome**  IF USING DOXIMITY or DOXY.ME - The patient will receive a link just prior to their visit by text.     FULL LENGTH CONSENT FOR TELE-HEALTH VISIT   I hereby voluntarily request, consent and authorize [facility] and its employed or contracted physicians, physician assistants, nurse practitioners or other licensed health care professionals (the Practitioner), to provide me with telemedicine health care services (the Services") as deemed necessary by the treating Practitioner. I acknowledge and consent to receive the Services by the Practitioner via telemedicine. I understand that the telemedicine visit will involve communicating with the Practitioner through live audiovisual communication technology and the disclosure of certain medical information by electronic transmission. I acknowledge that I have been given the opportunity to request an in-person assessment or other available alternative prior to the telemedicine visit and am voluntarily participating in the telemedicine visit.  I understand that I have the right to withhold or withdraw my consent to the use of telemedicine in the course of my care at any time, without affecting my right to future care or treatment, and that the Practitioner or I may terminate the telemedicine visit at  any time. I understand that I have the right to inspect all information obtained and/or recorded in the course of the telemedicine visit and may receive copies of available information for a reasonable fee.  I understand that some of the potential risks of receiving the Services via telemedicine include:   Delay or interruption in medical evaluation due to technological equipment failure or disruption;  Information transmitted may not be sufficient (e.g. poor resolution of images) to allow for appropriate medical decision making by the Practitioner; and/or   In rare instances, security protocols could fail, causing a breach of personal health information.  Furthermore, I acknowledge that it is my responsibility to provide information about my medical history, conditions and care that is complete and accurate to the best of my ability. I acknowledge that Practitioner's advice, recommendations, and/or decision may be based on factors not within their control, such as incomplete or inaccurate data provided by me or distortions of diagnostic images or specimens that may result from electronic transmissions. I understand that the practice of medicine is not  an Chief Strategy Officer and that Practitioner makes no warranties or guarantees regarding treatment outcomes. I acknowledge that I will receive a copy of this consent concurrently upon execution via email to the email address I last provided but may also request a printed copy by calling the office of [facility].    I understand that my insurance will be billed for this visit.   I have read or had this consent read to me.  I understand the contents of this consent, which adequately explains the benefits and risks of the Services being provided via telemedicine.   I have been provided ample opportunity to ask questions regarding this consent and the Services and have had my questions answered to my satisfaction.  I give my informed consent for the  services to be provided through the use of telemedicine in my medical care  By participating in this telemedicine visit I agree to the above.

## 2018-09-28 NOTE — Progress Notes (Signed)
Virtual Visit via Video Note   This visit type was conducted due to national recommendations for restrictions regarding the COVID-19 Pandemic (e.g. social distancing) in an effort to limit this patient's exposure and mitigate transmission in our community.  Due to her co-morbid illnesses, this patient is at least at moderate risk for complications without adequate follow up.  This format is felt to be most appropriate for this patient at this time.  All issues noted in this document were discussed and addressed.  A limited physical exam was performed with this format.  Please refer to the patient's chart for her consent to telehealth for [facility].   Evaluation Performed:  Follow-up visit  This visit type was conducted due to national recommendations for restrictions regarding the COVID-19 Pandemic (e.g. social distancing).  This format is felt to be most appropriate for this patient at this time.  All issues noted in this document were discussed and addressed.  No physical exam was performed (except for noted visual exam findings with Video Visits).  Please refer to the patient's chart (MyChart message for video visits and phone note for telephone visits) for the patient's consent to telehealth for [facility].  Date:  09/29/2018   ID:  [name], DOB [id number], MRN [id number]  Patient Location:  Home  Provider location:   [place]  PCP:  [name], MD  Cardiologist:  Fransico Him, MD Electrophysiologist:  None   Chief Complaint:  OSA and dilated aortic root  History of Present Illness:    Ann Bass is a 48 y.o. female who presents via audio/video conferencing for a telehealth visit today.    Ann Bass is a 48 y.o. female with a hx of palpitations and shortness of breath.  2D echocardiogram showed normal LV function with mildly dilated a sending aorta at 39 mmHg otherwise normal 2D echocardiogram.  Exercise stress test noted showed no inducible  ischemia.  Event monitor was normal except for a rare PAC.  She also has mild obstructive sleep apnea with an AHI of 6.8/hr.  Lowest oxygen saturation drop was 82%.  She is on CPAP.   She is here today for followup and is doing well.  She denies any chest pain or pressure, SOB, DOE, PND, orthopnea, LE edema, dizziness, palpitations or syncope. She is compliant with her meds and is tolerating meds with no SE.  She is doing well with her CPAP device and thinks that she has gotten used to it.  She tolerates the mask and feels the pressure is adequate.  Since going on CPAP Sse feels rested in the am and has no significant daytime sleepiness.  She denies any significant mouth or nasal dryness or nasal congestion.  She does not think that he snores.     The patient does not have symptoms concerning for COVID-19 infection (fever, chills, cough, or new shortness of breath).    Prior CV studies:   The following studies were reviewed today:  PaP compliance download  Past Medical History:  Diagnosis Date  . Allergy   . Depression    post-partum  . Dilated aortic root ([place])    38mm by echo 2018  . Inflammatory polyps of colon ([place])   . OSA (obstructive sleep apnea) 09/02/2017   Mild OSA with an AHI of 6.8/h.  On CPAP  . Palpitations    PACs noted on event monitor   Past Surgical History:  Procedure Laterality Date  . broken leg  1982   Left Femur, traction and pin in leg     Current Meds  Medication Sig  . ARMOUR THYROID 15 MG tablet Take 1 tablet (15 mg total) by mouth 2 (two) times a week.  [name] THYROID 60 MG tablet Take 1 tablet (60 mg total) by mouth daily.  . Ascorbic Acid (VITAMIN C) 1000 MG tablet Take 1,000 mg by mouth daily.  . citalopram (CELEXA) 20 MG tablet TAKE 1 TABLET BY MOUTH EVERY DAY  . Fexofenadine HCl (ALLEGRA PO) Take 1 tablet by mouth daily.  . Multiple Vitamin (MULTIVITAMIN) tablet Take 1 tablet by mouth daily.  . norethindrone-ethinyl estradiol (LOESTRIN FE)  1-20 MG-MCG tablet Take 1 tablet by mouth daily.  . Omega-3 Fatty Acids (FISH OIL PO) Take by mouth.     Allergies:   Penicillins   Social History   Tobacco Use  . Smoking status: Never Smoker  . Smokeless tobacco: Never Used  Substance Use Topics  . Alcohol use: Yes  . Drug use: No     Family Hx: The patient's family history includes Cancer in her father and paternal grandmother; Diabetes in her mother.  ROS:   Please see the history of present illness.     All other systems reviewed and are negative.   Labs/Other Tests and Data Reviewed:    Recent Labs: No results found for requested labs within last 8760 hours.   Recent Lipid Panel Lab Results  Component Value Date/Time   CHOL 185 08/26/2017   TRIG 155 08/26/2017   HDL 39 08/26/2017   CHOLHDL 4 09/17/2016 08:37 AM   LDLCALC 119 08/26/2017   LDLDIRECT 104.0 09/17/2016 08:37 AM    Wt Readings from Last 3 Encounters:  09/29/18 145 lb (65.8 kg)  09/21/17 142 lb 8 oz (64.6 kg)  09/02/17 145 lb 12.8 oz (66.1 kg)     Objective:    Vital Signs:  Ht 5\' 2"  (1.575 m)   Wt 145 lb (65.8 kg)   BMI 26.52 kg/m    CONSTITUTIONAL:  Well nourished, well developed female in no acute distress.  EYES: anicteric MOUTH: oral mucosa is pink RESPIRATORY: Normal respiratory effort, symmetric expansion CARDIOVASCULAR: No peripheral edema SKIN: No rash, lesions or ulcers MUSCULOSKELETAL: no digital cyanosis NEURO: Cranial Nerves II-XII grossly intact, moves all extremities PSYCH: Intact judgement and insight.  A&O x 3, Mood/affect appropriate   ASSESSMENT & PLAN:    1.  OSA - the patient is tolerating PAP therapy well without any problems. The PAP download was reviewed today and showed an AHI of 0.1/hr on auto PAP  with 77% compliance in using more than 4 hours nightly.  The patient has been using and benefiting from PAP use and will continue to benefit from therapy.   2.  Dilated ascending aorta - echo 2018 showed 99mm  ascending aortic diameter.  I will repeat a limited echo to make sure this is stable.   3.  COVID-19 Education:The signs and symptoms of COVID-19 were discussed with the patient and how to seek care for testing (follow up with PCP or arrange E-visit).  The importance of social distancing was discussed today.  Patient Risk:   After full review of this patient's clinical status, I feel that they are at least moderate risk at this time.  Time:   Today, I have spent 15 minutes with patient on telehealth technology.  I spent an additional 5 minutes reviewing patient's chart including 2D echo.  Medication Adjustments/Labs and  Tests Ordered: Current medicines are reviewed at length with the patient today.  Concerns regarding medicines are outlined above.  Tests Ordered: No orders of the defined types were placed in this encounter.  Medication Changes: No orders of the defined types were placed in this encounter.   Disposition:  Follow up in 1 year(s)  Signed, Fransico Him, MD  09/29/2018 8:01 AM    [place]

## 2018-09-29 ENCOUNTER — Encounter: Payer: Self-pay | Admitting: Cardiology

## 2018-09-29 ENCOUNTER — Other Ambulatory Visit: Payer: Self-pay

## 2018-09-29 ENCOUNTER — Telehealth (INDEPENDENT_AMBULATORY_CARE_PROVIDER_SITE_OTHER): Payer: BLUE CROSS/BLUE SHIELD | Admitting: Cardiology

## 2018-09-29 VITALS — Ht 62.0 in | Wt 145.0 lb

## 2018-09-29 DIAGNOSIS — Z7189 Other specified counseling: Secondary | ICD-10-CM

## 2018-09-29 DIAGNOSIS — G4733 Obstructive sleep apnea (adult) (pediatric): Secondary | ICD-10-CM

## 2018-09-29 DIAGNOSIS — I7121 Aneurysm of the ascending aorta, without rupture: Secondary | ICD-10-CM

## 2018-09-29 DIAGNOSIS — I712 Thoracic aortic aneurysm, without rupture: Secondary | ICD-10-CM | POA: Diagnosis not present

## 2018-09-29 NOTE — Patient Instructions (Addendum)
Medication Instructions:  Your physician recommends that you continue on your current medications as directed. Please refer to the Current Medication list given to you today.  If you need a refill on your cardiac medications before your next appointment, please call your pharmacy.   Lab work: None Ordered  If you have labs (blood work) drawn today and your tests are completely normal, you will receive your results only by: Marland Kitchen MyChart Message (if you have MyChart) OR . A paper copy in the mail If you have any lab test that is abnormal or we need to change your treatment, we will call you to review the results.  Testing/Procedures: Your physician has requested that you have an echocardiogram on 11/21/18 at 9:30 AM. Echocardiography is a painless test that uses sound waves to create images of your heart. It provides your doctor with information about the size and shape of your heart and how well your heart's chambers and valves are working. This procedure takes approximately one hour. There are no restrictions for this procedure.  Follow-Up: At [facility], you and your health needs are our priority.  As part of our continuing mission to provide you with exceptional heart care, we have created designated Provider Care Teams.  These Care Teams include your primary Cardiologist (physician) and Advanced Practice Providers (APPs -  Physician Assistants and Nurse Practitioners) who all work together to provide you with the care you need, when you need it. . You will need a follow up appointment in 1 year.  Please call our office 2 months in advance to schedule this appointment.  You may see Fransico Him, MD or one of the following Advanced Practice Providers on your designated Care Team:   . Lyda Jester, PA-C . Dayna Dunn, PA-C . Ermalinda Barrios, PA-C  Any Other Special Instructions Will Be Listed Below (If Applicable).  1. Gae Bon will order your PAP supplies  2. Your download looks great! No  apneas.

## 2018-10-03 ENCOUNTER — Other Ambulatory Visit: Payer: Self-pay | Admitting: Family Medicine

## 2018-10-25 DIAGNOSIS — R21 Rash and other nonspecific skin eruption: Secondary | ICD-10-CM | POA: Diagnosis not present

## 2018-11-07 ENCOUNTER — Other Ambulatory Visit: Payer: Self-pay | Admitting: Family Medicine

## 2018-11-21 ENCOUNTER — Other Ambulatory Visit: Payer: Self-pay

## 2018-11-21 ENCOUNTER — Ambulatory Visit (HOSPITAL_COMMUNITY): Payer: BC Managed Care – PPO | Attending: Internal Medicine

## 2018-11-21 DIAGNOSIS — I712 Thoracic aortic aneurysm, without rupture: Secondary | ICD-10-CM | POA: Diagnosis not present

## 2018-11-21 DIAGNOSIS — I7121 Aneurysm of the ascending aorta, without rupture: Secondary | ICD-10-CM

## 2018-12-01 ENCOUNTER — Telehealth: Payer: Self-pay

## 2018-12-01 DIAGNOSIS — I77810 Thoracic aortic ectasia: Secondary | ICD-10-CM

## 2018-12-01 DIAGNOSIS — I7121 Aneurysm of the ascending aorta, without rupture: Secondary | ICD-10-CM

## 2018-12-01 DIAGNOSIS — I10 Essential (primary) hypertension: Secondary | ICD-10-CM

## 2018-12-01 DIAGNOSIS — I712 Thoracic aortic aneurysm, without rupture: Secondary | ICD-10-CM

## 2018-12-01 NOTE — Telephone Encounter (Signed)
-----   Message from Sueanne Margarita, MD sent at 11/22/2018  4:34 PM EDT ----- Echo showed normal LVF with mildly dilated aortic root and ascending aorta.  Please order EKG gated chest CT angio of the aorta to evaluate further as well as an abdominal US to rule out AAA.  SHe will need a BMET for CT

## 2018-12-01 NOTE — Telephone Encounter (Signed)
Spoke with the patient, she expressed understanding about her results. She wanted both procedures to be performed at [facility].

## 2018-12-08 ENCOUNTER — Other Ambulatory Visit: Payer: Self-pay

## 2018-12-08 ENCOUNTER — Ambulatory Visit (HOSPITAL_COMMUNITY)
Admission: RE | Admit: 2018-12-08 | Discharge: 2018-12-08 | Disposition: A | Discharge status: Home / Self Care | Payer: BC Managed Care – PPO | Source: Ambulatory Visit | Attending: Cardiovascular Disease | Admitting: Cardiovascular Disease

## 2018-12-08 DIAGNOSIS — I712 Thoracic aortic aneurysm, without rupture: Secondary | ICD-10-CM | POA: Insufficient documentation

## 2018-12-08 DIAGNOSIS — I77810 Thoracic aortic ectasia: Secondary | ICD-10-CM | POA: Insufficient documentation

## 2018-12-08 DIAGNOSIS — I7121 Aneurysm of the ascending aorta, without rupture: Secondary | ICD-10-CM

## 2018-12-19 ENCOUNTER — Other Ambulatory Visit: Payer: Self-pay

## 2018-12-19 ENCOUNTER — Other Ambulatory Visit: Payer: BC Managed Care – PPO | Admitting: *Deleted

## 2018-12-19 DIAGNOSIS — I10 Essential (primary) hypertension: Secondary | ICD-10-CM | POA: Diagnosis not present

## 2018-12-19 LAB — BASIC METABOLIC PANEL
BUN/Creatinine Ratio: 14 (ref 9–23)
BUN: 9 mg/dL (ref 6–24)
CO2: 20 mmol/L (ref 20–29)
Calcium: 9.4 mg/dL (ref 8.7–10.2)
Chloride: 103 mmol/L (ref 96–106)
Creatinine, Ser: 0.65 mg/dL (ref 0.57–1.00)
GFR calc Af Amer: 122 mL/min/{1.73_m2} (ref >59)
GFR calc non Af Amer: 106 mL/min/{1.73_m2} (ref >59)
Glucose: 85 mg/dL (ref 65–99)
Potassium: 4.3 mmol/L (ref 3.5–5.2)
Sodium: 139 mmol/L (ref 134–144)

## 2018-12-20 ENCOUNTER — Telehealth: Payer: Self-pay

## 2018-12-20 NOTE — Telephone Encounter (Signed)
Notes recorded by Frederik Schmidt, RN on 12/20/2018 at 8:11 AM EDT  The patient has been notified of the result and verbalized understanding. All questions (if any) were answered.  Frederik Schmidt, RN 12/20/2018 8:11 AM

## 2018-12-20 NOTE — Telephone Encounter (Signed)
-----   Message from Sueanne Margarita, MD sent at 12/19/2018  6:03 PM EDT ----- Please let patient know that labs were normal.  Continue current medical therapy.

## 2018-12-21 ENCOUNTER — Encounter: Payer: BLUE CROSS/BLUE SHIELD | Admitting: Family Medicine

## 2018-12-22 ENCOUNTER — Ambulatory Visit (HOSPITAL_COMMUNITY)
Admission: RE | Admit: 2018-12-22 | Discharge: 2018-12-22 | Disposition: A | Discharge status: Home / Self Care | Payer: BC Managed Care – PPO | Source: Ambulatory Visit | Attending: Cardiology | Admitting: Cardiology

## 2018-12-22 ENCOUNTER — Other Ambulatory Visit: Payer: Self-pay

## 2018-12-22 DIAGNOSIS — I77810 Thoracic aortic ectasia: Secondary | ICD-10-CM | POA: Diagnosis not present

## 2018-12-22 DIAGNOSIS — I712 Thoracic aortic aneurysm, without rupture: Secondary | ICD-10-CM | POA: Diagnosis not present

## 2018-12-22 DIAGNOSIS — I7121 Aneurysm of the ascending aorta, without rupture: Secondary | ICD-10-CM

## 2018-12-22 MED ORDER — IOHEXOL 350 MG/ML SOLN
80.0000 mL | Freq: Once | INTRAVENOUS | Status: AC | PRN
  Administered 2018-12-22: 17:00:00 80 mL via INTRAVENOUS

## 2018-12-23 ENCOUNTER — Encounter: Payer: Self-pay | Admitting: Cardiology

## 2018-12-26 ENCOUNTER — Telehealth: Payer: Self-pay | Admitting: Cardiology

## 2018-12-26 NOTE — Telephone Encounter (Signed)
Patient calling for her CT results. Patient stated it is okay to leave detailed message.

## 2018-12-26 NOTE — Telephone Encounter (Signed)
Called pt back regarding Dr. Landis Gandy additional advisement. Left pt detailed message as she requested and advised her to call back with any questions/concerns.

## 2018-12-26 NOTE — Telephone Encounter (Signed)
Spoke with pt regarding her CTA results. Pt is questioning "Bibasilar atelectasis" which was in the findings of the report. I reviewed Dr. Landis Gandy recommendations and suggested pt follow up with PCP. Pt is also concerned about her anyerusum growing for 3.9cm on 2018 echo compared to 4.2cm now. Pt would like to know if this is a significant growth as well as if she needs to follow up regarding the atelectasis. Will route to [facility] for advisement.

## 2018-12-26 NOTE — Telephone Encounter (Signed)
She needs to followup with PCP about atelectasis.  Lets do cardiac MRI/MRA in 6 months instead of 1 year to make sure it has not grown any.  4.2cm is mildly dilated and should not be concerned at this time.  Will continue to follow it.  Please make sure she is not doing any type of upper body weight lifting, pull ups, or planks

## 2019-01-09 ENCOUNTER — Encounter: Payer: Self-pay | Admitting: Family Medicine

## 2019-01-13 ENCOUNTER — Encounter: Payer: Self-pay | Admitting: Family Medicine

## 2019-01-13 ENCOUNTER — Other Ambulatory Visit: Payer: Self-pay

## 2019-01-13 ENCOUNTER — Ambulatory Visit (INDEPENDENT_AMBULATORY_CARE_PROVIDER_SITE_OTHER): Payer: BC Managed Care – PPO | Admitting: Family Medicine

## 2019-01-13 VITALS — BP 122/82 | HR 63 | Temp 97.8°F | Resp 16 | Ht 62.0 in | Wt 150.0 lb

## 2019-01-13 DIAGNOSIS — I7121 Aneurysm of the ascending aorta, without rupture: Secondary | ICD-10-CM

## 2019-01-13 DIAGNOSIS — E039 Hypothyroidism, unspecified: Secondary | ICD-10-CM

## 2019-01-13 DIAGNOSIS — E663 Overweight: Secondary | ICD-10-CM | POA: Diagnosis not present

## 2019-01-13 DIAGNOSIS — K760 Fatty (change of) liver, not elsewhere classified: Secondary | ICD-10-CM

## 2019-01-13 DIAGNOSIS — R7302 Impaired glucose tolerance (oral): Secondary | ICD-10-CM

## 2019-01-13 DIAGNOSIS — Z23 Encounter for immunization: Secondary | ICD-10-CM

## 2019-01-13 DIAGNOSIS — I712 Thoracic aortic aneurysm, without rupture: Secondary | ICD-10-CM

## 2019-01-13 DIAGNOSIS — N281 Cyst of kidney, acquired: Secondary | ICD-10-CM | POA: Diagnosis not present

## 2019-01-13 LAB — T4, FREE: Free T4: 0.77 ng/dL (ref 0.60–1.60)

## 2019-01-13 LAB — CBC WITH DIFFERENTIAL/PLATELET
Basophils Absolute: 0 10*3/uL (ref 0.0–0.1)
Basophils Relative: 0.6 % (ref 0.0–3.0)
Eosinophils Absolute: 0.2 10*3/uL (ref 0.0–0.7)
Eosinophils Relative: 3.5 % (ref 0.0–5.0)
HCT: 45.8 % (ref 36.0–46.0)
Hemoglobin: 15.8 g/dL — ABNORMAL HIGH (ref 12.0–15.0)
Lymphocytes Relative: 29.2 % (ref 12.0–46.0)
Lymphs Abs: 1.9 10*3/uL (ref 0.7–4.0)
MCHC: 34.4 g/dL (ref 30.0–36.0)
MCV: 89.8 fl (ref 78.0–100.0)
Monocytes Absolute: 0.4 10*3/uL (ref 0.1–1.0)
Monocytes Relative: 5.6 % (ref 3.0–12.0)
Neutro Abs: 3.9 10*3/uL (ref 1.4–7.7)
Neutrophils Relative %: 61.1 % (ref 43.0–77.0)
Platelets: 285 10*3/uL (ref 150.0–400.0)
RBC: 5.1 Mil/uL (ref 3.87–5.11)
RDW: 13.2 % (ref 11.5–15.5)
WBC: 6.4 10*3/uL (ref 4.0–10.5)

## 2019-01-13 LAB — HEPATIC FUNCTION PANEL
ALT: 18 U/L (ref 0–35)
AST: 16 U/L (ref 0–37)
Albumin: 4.3 g/dL (ref 3.5–5.2)
Alkaline Phosphatase: 52 U/L (ref 39–117)
Bilirubin, Direct: 0.1 mg/dL (ref 0.0–0.3)
Total Bilirubin: 0.3 mg/dL (ref 0.2–1.2)
Total Protein: 6.7 g/dL (ref 6.0–8.3)

## 2019-01-13 LAB — LDL CHOLESTEROL, DIRECT: Direct LDL: 155 mg/dL

## 2019-01-13 LAB — T3, FREE: T3, Free: 3.9 pg/mL (ref 2.3–4.2)

## 2019-01-13 LAB — BASIC METABOLIC PANEL
BUN: 14 mg/dL (ref 6–23)
CO2: 25 mEq/L (ref 19–32)
Calcium: 9.8 mg/dL (ref 8.4–10.5)
Chloride: 101 mEq/L (ref 96–112)
Creatinine, Ser: 0.6 mg/dL (ref 0.40–1.20)
GFR: 106.73 mL/min (ref >60.00)
Glucose, Bld: 85 mg/dL (ref 70–99)
Potassium: 3.9 mEq/L (ref 3.5–5.1)
Sodium: 135 mEq/L (ref 135–145)

## 2019-01-13 LAB — TSH: TSH: 0.89 u[IU]/mL (ref 0.35–4.50)

## 2019-01-13 LAB — LIPID PANEL
Cholesterol: 223 mg/dL — ABNORMAL HIGH (ref 0–200)
HDL: 37.6 mg/dL — ABNORMAL LOW (ref >39.00)
Total CHOL/HDL Ratio: 6
Triglycerides: 517 mg/dL — ABNORMAL HIGH (ref 0.0–149.0)

## 2019-01-13 LAB — HEMOGLOBIN A1C: Hgb A1c MFr Bld: 5.5 % (ref 4.6–6.5)

## 2019-01-13 NOTE — Assessment & Plan Note (Signed)
This has enlarged since last assessed in 2018 (39 -->32mm).  She is following w/ Dr Radford Pax and plan is for repeat imaging in 6 months.  Pt reports she has been told in the meantime not to do pushups or any upper body exercises.

## 2019-01-13 NOTE — Assessment & Plan Note (Signed)
Pt has hx of this.  Did not keep referral appt to liver specialist.  Will reassess liver fxns and if still elevated will re-refer.  Pt expressed understanding and is in agreement w/ plan.

## 2019-01-13 NOTE — Assessment & Plan Note (Signed)
Pt has hx of this.  Will check A1C.

## 2019-01-13 NOTE — Assessment & Plan Note (Signed)
New.  Measures 3.2 x 3.1 cm on CT ang.  The plan is for this to be repeated in 6 months so pt would prefer to hold off on renal US at this time.  Will continue to follow.

## 2019-01-13 NOTE — Assessment & Plan Note (Signed)
Currently asymptomatic but overdue for labs.  Check labs.  Adjust meds prn

## 2019-01-13 NOTE — Assessment & Plan Note (Signed)
Pt continues to gain weight.  Stressed need for healthy diet and regular exercise.  Will continue to follow.

## 2019-01-13 NOTE — Progress Notes (Signed)
   Subjective:    Patient ID: Ann Bass, female    DOB: Jun 19, 1970, 48 y.o.   MRN: [id number]  HPI Ascending thoracic aortic aneurysm- following w/ Dr Radford Pax.  Plan is for repeat imaging in 6 months.  Denies CP, SOB, edema.  L kidney cyst- noted on CT angio, 3.2 x 3.1 cm.  No family hx of renal disease.  Cr WNL.    Fatty liver- noted again on CT angio.  Due for repeat liver functions.  Pt declined appt w/ liver specialists.  No abd pain, N/V.  Has reduced carb intake.  Hypothyroid- due for repeat thyroid labs.  On Armour 75mg  on Monday and Thursday and 60mg  all other days.   Review of Systems For ROS see HPI     Objective:   Physical Exam Vitals signs reviewed.  Constitutional:      General: She is not in acute distress.    Appearance: She is well-developed. She is obese.  HENT:     Head: Normocephalic and atraumatic.  Eyes:     Conjunctiva/sclera: Conjunctivae normal.     Pupils: Pupils are equal, round, and reactive to light.  Neck:     Musculoskeletal: Normal range of motion and neck supple.     Thyroid: No thyromegaly.  Cardiovascular:     Rate and Rhythm: Normal rate and regular rhythm.     Heart sounds: Normal heart sounds. No murmur.  Pulmonary:     Effort: Pulmonary effort is normal. No respiratory distress.     Breath sounds: Normal breath sounds.  Abdominal:     General: There is no distension.     Palpations: Abdomen is soft.     Tenderness: There is no abdominal tenderness.  Lymphadenopathy:     Cervical: No cervical adenopathy.  Skin:    General: Skin is warm and dry.  Neurological:     Mental Status: She is alert and oriented to person, place, and time.  Psychiatric:        Behavior: Behavior normal.           Assessment & Plan:

## 2019-01-13 NOTE — Patient Instructions (Signed)
Schedule your complete physical in 6 months We'll notify you of your lab results and make any changes if needed Call/MyChart and ask about the exercise restrictions We'll keep an eye on that renal cyst when you have your next imaging Once we see the liver functions, we can decide on whether you want to do the referral Call with any questions or concerns Stay Safe!!!

## 2019-01-16 ENCOUNTER — Other Ambulatory Visit: Payer: Self-pay | Admitting: General Practice

## 2019-01-16 MED ORDER — FENOFIBRATE 160 MG PO TABS: 160.0000 mg | ORAL_TABLET | Freq: Every day | ORAL | 6 refills | Status: DC

## 2019-01-19 DIAGNOSIS — L309 Dermatitis, unspecified: Secondary | ICD-10-CM | POA: Diagnosis not present

## 2019-01-19 DIAGNOSIS — L308 Other specified dermatitis: Secondary | ICD-10-CM | POA: Diagnosis not present

## 2019-01-27 ENCOUNTER — Encounter: Payer: BC Managed Care – PPO | Admitting: Family Medicine

## 2019-02-20 DIAGNOSIS — L821 Other seborrheic keratosis: Secondary | ICD-10-CM | POA: Diagnosis not present

## 2019-02-20 DIAGNOSIS — L2389 Allergic contact dermatitis due to other agents: Secondary | ICD-10-CM | POA: Diagnosis not present

## 2019-02-23 DIAGNOSIS — L503 Dermatographic urticaria: Secondary | ICD-10-CM | POA: Diagnosis not present

## 2019-02-23 DIAGNOSIS — L2389 Allergic contact dermatitis due to other agents: Secondary | ICD-10-CM | POA: Diagnosis not present

## 2019-02-28 ENCOUNTER — Other Ambulatory Visit: Payer: Self-pay | Admitting: General Practice

## 2019-02-28 ENCOUNTER — Other Ambulatory Visit: Payer: Self-pay | Admitting: Family Medicine

## 2019-02-28 MED ORDER — ARMOUR THYROID 60 MG PO TABS: 60.0000 mg | ORAL_TABLET | Freq: Every day | ORAL | 3 refills | Status: DC

## 2019-02-28 MED ORDER — ARMOUR THYROID 15 MG PO TABS: ORAL_TABLET | ORAL | 3 refills | Status: DC

## 2019-03-02 ENCOUNTER — Other Ambulatory Visit: Payer: Self-pay | Admitting: Family Medicine

## 2019-04-19 DIAGNOSIS — M25512 Pain in left shoulder: Secondary | ICD-10-CM | POA: Diagnosis not present

## 2019-05-02 ENCOUNTER — Ambulatory Visit: Payer: BC Managed Care – PPO | Attending: Orthopedic Surgery | Admitting: Physical Therapy

## 2019-05-02 ENCOUNTER — Other Ambulatory Visit: Payer: Self-pay

## 2019-05-02 ENCOUNTER — Encounter: Payer: Self-pay | Admitting: Physical Therapy

## 2019-05-02 DIAGNOSIS — M25612 Stiffness of left shoulder, not elsewhere classified: Secondary | ICD-10-CM | POA: Diagnosis not present

## 2019-05-02 DIAGNOSIS — M25512 Pain in left shoulder: Secondary | ICD-10-CM

## 2019-05-02 NOTE — Therapy (Signed)
[facility] [address] [place], [place], [address] Phone: [phone]   Fax:  [phone]  Physical Therapy Evaluation  Patient Details  Name: Ann Bass MRN: [id number] Date of Birth: [birth year] Referring Provider (PT): Carlynn Spry Date: 05/02/2019  PT End of Session - 05/02/19 1054    Visit Number  1    Date for PT Re-Evaluation  07/03/19    PT Start Time  1017    PT Stop Time  1100    PT Time Calculation (min)  43 min    Activity Tolerance  Patient tolerated treatment well    Behavior During Therapy  [facility] for tasks assessed/performed       Past Medical History:  Diagnosis Date  . Allergy   . Depression    post-partum  . Dilated aortic root ([place])    64mm by echo 2018 and 72mm by echo 12/2018  . Inflammatory polyps of colon ([place])   . OSA (obstructive sleep apnea) 09/02/2017   Mild OSA with an AHI of 6.8/h.  On CPAP  . Palpitations    PACs noted on event monitor    Past Surgical History:  Procedure Laterality Date  . broken leg  1982   Left Femur, traction and pin in leg    There were no vitals filed for this visit.   Subjective Assessment - 05/02/19 1020    Subjective  Patient reports onset of left shoulder pain in February, unsure of any specific cause, reports that she reached behind her and had significant pain, She reports that she feels that she has lost ROM.    Limitations  House hold activities    Patient Stated Goals  have less pain and better motions    Currently in Pain?  Yes    Pain Score  1     Pain Location  Shoulder    Pain Orientation  Left    Pain Descriptors / Indicators  Aching    Pain Type  Acute pain    Pain Radiating Towards  no    Pain Onset  More than a month ago    Pain Frequency  Intermittent    Aggravating Factors   reaching behind back, reaching out and up 8-9/10    Pain Relieving Factors  at rest and not moving the pain can be 0/10    Effect of Pain on  Daily Activities  not exercising, some difficulty with dressing         [facility] PT Assessment - 05/02/19 0001      Assessment   Medical Diagnosis  left shoulder pain    Referring Provider (PT)  Norris    Onset Date/Surgical Date  04/02/19    Hand Dominance  Right    Prior Therapy  no      Precautions   Precaution Comments  AAA no straining with UE's      Home Environment   Additional Comments  does housework      Prior Function   Level of Independence  Independent    Vocation  Unemployed    Leisure  has not done any exercises in about 10 months      Posture/Postural Control   Posture Comments  fwd head, rounded shoulders      ROM / Strength   AROM / PROM / Strength  AROM;Strength;PROM      AROM   Overall AROM Comments  all active motions caused pain  AROM Assessment Site  Shoulder    Right/Left Shoulder  Left    Left Shoulder Flexion  130 Degrees   pain started at 100 degrees   Left Shoulder ABduction  60 Degrees   very painful and careful with this motions   Left Shoulder Internal Rotation  45 Degrees    Left Shoulder External Rotation  45 Degrees      PROM   Overall PROM Comments  all PROM caused pain at end ranges    PROM Assessment Site  Shoulder    Right/Left Shoulder  Left    Left Shoulder Flexion  145 Degrees    Left Shoulder ABduction  90 Degrees    Left Shoulder Internal Rotation  50 Degrees    Left Shoulder External Rotation  60 Degrees      Strength   Overall Strength Comments  ER/IR 4-/5 with slight pain 3+/5 for the flexion and abduction with pain      Special Tests   Other special tests  + impingement and some pain with empty can test                Objective measurements completed on examination: See above findings.                PT Short Term Goals - 05/02/19 1058      PT SHORT TERM GOAL #1   Title  independent with initial HEP    Time  2    Period  Weeks    Status  New        PT Long Term Goals - 05/02/19  1058      PT LONG TERM GOAL #1   Title  understand posture and body mechanics instruction    Time  8    Period  Weeks    Status  New      PT LONG TERM GOAL #2   Title  increase AROM of abduction left shoulder to 140 degrees    Time  8    Period  Weeks    Status  New      PT LONG TERM GOAL #3   Title  decrease pain 50%    Time  8    Period  Weeks    Status  New      PT LONG TERM GOAL #4   Title  increase left shoulder IR to 70 degrees    Time  8    Period  Weeks    Status  New             Plan - 05/02/19 1054    Clinical Impression Statement  Patient reports that she started having left shoulder pain in February, she reports that she felt pain when reaching behind her back, the pain has been off and on since and she feels that she has lost ROM.  She has limited ROM especially with abduction and very painful.  She has limited and painful PROM.  She had a positive impingement test and some pain with empty can test.    Personal Factors and Comorbidities  Comorbidity 1    Comorbidities  AAA, be careful with any UE straining    Stability/Clinical Decision Making  Stable/Uncomplicated    Clinical Decision Making  Low    Rehab Potential  Good    PT Frequency  2x / week    PT Duration  8 weeks    PT Treatment/Interventions  ADLs/Self Care Home Management;Ultrasound;Moist Heat;Electrical Stimulation;Cryotherapy;Therapeutic exercise;Therapeutic  activities;Patient/family education;Manual techniques;Passive range of motion;Iontophoresis 4mg /ml Dexamethasone    PT Next Visit Plan  assure HEP , start scapular stability and give HEP for this, try ionto    Consulted and Agree with Plan of Care  Patient       Patient will benefit from skilled therapeutic intervention in order to improve the following deficits and impairments:  Pain, Improper body mechanics, Increased muscle spasms, Postural dysfunction, Decreased range of motion, Decreased strength, Impaired UE functional use, Impaired  flexibility  Visit Diagnosis: Acute pain of left shoulder - Plan: PT plan of care cert/re-cert  Stiffness of left shoulder, not elsewhere classified - Plan: PT plan of care cert/re-cert     Problem List Patient Active Problem List   Diagnosis Date Noted  . Steatosis of liver 01/13/2019  . Renal cyst, left 01/13/2019  . Overweight (BMI 25.0-29.9) 01/13/2019  . OSA (obstructive sleep apnea) 09/02/2017  . Ascending aortic aneurysm ([place])   . SOB (shortness of breath) 02/18/2017  . Excessive daytime sleepiness 02/18/2017  . Vitamin D deficiency 09/17/2016  . Physical exam 09/13/2014  . Palpitations 09/13/2014  . Decreased hearing of both ears 09/13/2014  . Glucose intolerance (impaired glucose tolerance) 02/22/2013  . Depression 02/22/2013  . Hypothyroid 02/22/2013    Sumner Boast., PT 05/02/2019, 11:01 AM  [address] [place], [place], [address] Phone: [phone]   Fax:  [phone]  Name: SERENA NUGEN MRN: [id number] Date of Birth: 04-15-1971

## 2019-05-02 NOTE — Patient Instructions (Signed)
Access Code: [email or web address]  URL: [email or web address]  Date: 05/02/2019  Prepared by: Lum Babe   Exercises Standing Shoulder and Trunk Flexion at Table - 5 reps - 1 sets - 10 hold - 2x daily - 7x weekly Standing Shoulder Flexion Stretch on Wall - 10 reps - 1 sets - 5 hold - 2x daily - 7x weekly Seated Shoulder External Rotation PROM on Table - 5 reps - 1 sets - 10 hold - 2x daily - 7x weekly Standing Shoulder Internal Rotation Stretch with Dowel - 5 reps - 1 sets - 10 hold - 2x daily - 7x weekly Seated Scapular Retraction - 10 reps - 1 sets - 3 hold - 2x daily - 7x weekly Seated Shoulder Shrugs - 10 reps - 1 sets - 3 hold - 2x daily - 7x weekly

## 2019-05-09 ENCOUNTER — Ambulatory Visit: Payer: BC Managed Care – PPO | Admitting: Physical Therapy

## 2019-05-09 ENCOUNTER — Other Ambulatory Visit: Payer: Self-pay

## 2019-05-09 DIAGNOSIS — M25612 Stiffness of left shoulder, not elsewhere classified: Secondary | ICD-10-CM

## 2019-05-09 DIAGNOSIS — M25512 Pain in left shoulder: Secondary | ICD-10-CM | POA: Diagnosis not present

## 2019-05-09 NOTE — Therapy (Signed)
[facility] [address] [place], [place], [address] Phone: [phone]   Fax:  [phone]  Physical Therapy Treatment  Patient Details  Name: Ann Bass MRN: [id number] Date of Birth: 03/06/1971 Referring Provider (PT): Norris   Encounter Date: 05/09/2019  PT End of Session - 05/09/19 0832    Visit Number  2    Date for PT Re-Evaluation  07/03/19    PT Start Time  0802    PT Stop Time  0835    PT Time Calculation (min)  33 min       Past Medical History:  Diagnosis Date  . Allergy   . Depression    post-partum  . Dilated aortic root ([facility])    39mm by echo 2018 and 42mm by echo 12/2018  . Inflammatory polyps of colon ([facility])   . OSA (obstructive sleep apnea) 09/02/2017   Mild OSA with an AHI of 6.8/h.  On CPAP  . Palpitations    PACs noted on event monitor    Past Surgical History:  Procedure Laterality Date  . broken leg  1982   Left Femur, traction and pin in leg    There were no vitals filed for this visit.  Subjective Assessment - 05/09/19 0804    Subjective  no pain now, ex are really helping    Currently in Pain?  No/denies         [facility] PT Assessment - 05/09/19 0001      AROM   AROM Assessment Site  Shoulder    Right/Left Shoulder  Left    Left Shoulder Flexion  175 Degrees    Left Shoulder ABduction  170 Degrees    Left Shoulder Internal Rotation  70 Degrees    Left Shoulder External Rotation  80 Degrees                   [facility] Adult PT Treatment/Exercise - 05/09/19 0001      Exercises   Exercises  Shoulder;Neck      Shoulder Exercises: Standing   External Rotation  Strengthening;Left;20 reps;Theraband    Theraband Level (Shoulder External Rotation)  Level 2 (Red)    Internal Rotation  Strengthening;Left;20 reps;Theraband    Theraband Level (Shoulder Internal Rotation)  Level 2 (Red)    ABduction  Strengthening;Both;15 reps;Theraband    Theraband Level (Shoulder  ABduction)  Level 2 (Red)    Extension  Strengthening;Both;15 reps;Weights    Extension Weight (lbs)  3    Extension Limitations  15x red tband    Row  Strengthening;Both;15 reps;Theraband    Theraband Level (Shoulder Row)  Level 2 (Red)    Other Standing Exercises  shruggs and backward rolls 3# 15 x each    Other Standing Exercises  empty can and PNF 3# 2 sets 10      Shoulder Exercises: ROM/Strengthening   UBE (Upper Arm Bike)  L 2 2 fwd/2 back             PT Education - 05/09/19 0826    Education Details  red tband scap stab    Person(s) Educated  Patient    Methods  Explanation;Demonstration;Handout    Comprehension  Verbalized understanding;Returned demonstration       PT Short Term Goals - 05/09/19 0833      PT SHORT TERM GOAL #1   Title  independent with initial HEP    Status  Achieved          PT Long Term Goals - 05/09/19 0833      PT LONG TERM GOAL #1   Title  understand posture and body mechanics instruction    Status  Partially Met      PT LONG TERM GOAL #2   Title  increase AROM of abduction left shoulder to 140 degrees    Status  Achieved      PT LONG TERM GOAL #3   Title  decrease pain 50%    Status  Partially Met      PT LONG TERM GOAL #4   Title  increase left shoulder IR to 70 degrees    Status  Achieved            Plan - 05/09/19 0833    Clinical Impression Statement  excellent increase in AROM and reduction of pain. educ on posture and BM throughout session. progressed ex and added to HEP without pain.    PT Treatment/Interventions  ADLs/Self Care Home Management;Ultrasound;Moist Heat;Electrical Stimulation;Cryotherapy;Therapeutic exercise;Therapeutic activities;Patient/family education;Manual techniques;Passive range of motion;Iontophoresis 4mg/ml Dexamethasone    PT Next Visit Plan  assess and progress, ionto if pain returns       Patient will benefit from skilled therapeutic intervention in order to improve the following  deficits and impairments:  Pain, Improper body mechanics, Increased muscle spasms, Postural dysfunction, Decreased range of motion, Decreased strength, Impaired UE functional use, Impaired flexibility  Visit Diagnosis: Stiffness of left shoulder, not elsewhere classified  Acute pain of left shoulder     Problem List Patient Active Problem List   Diagnosis Date Noted  . Steatosis of liver 01/13/2019  . Renal cyst, left 01/13/2019  . Overweight (BMI 25.0-29.9) 01/13/2019  . OSA (obstructive sleep apnea) 09/02/2017  . Ascending aortic aneurysm ([facility])   . SOB (shortness of breath) 02/18/2017  . Excessive daytime sleepiness 02/18/2017  . Vitamin D deficiency 09/17/2016  . Physical exam 09/13/2014  . Palpitations 09/13/2014  . Decreased hearing of both ears 09/13/2014  . Glucose intolerance (impaired glucose tolerance) 02/22/2013  . Depression 02/22/2013  . Hypothyroid 02/22/2013    ,[name] 05/09/2019, 8:35 AM   [facility] [address] [place], [place], [address] Phone: [phone]   Fax:  [phone]  Name: Ann Bass MRN: [id number] Date of Birth: 05/01/1971   

## 2019-05-15 ENCOUNTER — Other Ambulatory Visit: Payer: Self-pay

## 2019-05-15 ENCOUNTER — Encounter: Payer: Self-pay | Admitting: Physical Therapy

## 2019-05-15 ENCOUNTER — Ambulatory Visit: Payer: BC Managed Care – PPO | Admitting: Physical Therapy

## 2019-05-15 DIAGNOSIS — M25512 Pain in left shoulder: Secondary | ICD-10-CM | POA: Diagnosis not present

## 2019-05-15 DIAGNOSIS — G4733 Obstructive sleep apnea (adult) (pediatric): Secondary | ICD-10-CM | POA: Diagnosis not present

## 2019-05-15 DIAGNOSIS — M25612 Stiffness of left shoulder, not elsewhere classified: Secondary | ICD-10-CM

## 2019-05-15 NOTE — Therapy (Signed)
[facility] [address] [place], [place], [address] Phone: [phone]   Fax:  [phone]  Physical Therapy Treatment  Patient Details  Name: Ann Bass MRN: [id number] Date of Birth: 1971-05-12 Referring Provider (PT): Carlynn Spry Date: 05/15/2019  PT End of Session - 05/15/19 1517    Visit Number  3    Date for PT Re-Evaluation  07/03/19    PT Start Time  1430    PT Stop Time  1515    PT Time Calculation (min)  45 min    Activity Tolerance  Patient tolerated treatment well    Behavior During Therapy  [facility] for tasks assessed/performed       Past Medical History:  Diagnosis Date  . Allergy   . Depression    post-partum  . Dilated aortic root ([place])    55m by echo 2018 and 481mby echo 12/2018  . Infl[place]ps of colon ([place]  . OSA (obstructive sleep apnea) 09/02/2017   Mild OSA with an AHI of 6.8/h.  On CPAP  . Palpitations    PACs noted on event monitor    Past Surgical History:  Procedure Laterality Date  . broken leg  1982   Left Femur, traction and pin in leg    There were no vitals filed for this visit.  Subjective Assessment - 05/15/19 1434    Subjective  Pt reports that's he is doing really well, "Those stretches worked like magic"    Currently in Pain?  No/denies                       [facility]      Shoulder Exercises: Standing   External Rotation  Strengthening;Left;20 reps;Theraband    Theraband Level (Shoulder External Rotation)  Level 2 (Red)    Internal Rotation  Strengthening;Left;20 reps;Theraband    Theraband Level (Shoulder Internal Rotation)  Level 2 (Red)    Flexion  Theraband;Left;Strengthening;20 reps    Theraband Level (Shoulder Flexion)  Level 2 (Red)    ABduction  Strengthening;Both;Theraband;10 reps   6/10 pain    Theraband Level (Shoulder ABduction)  Level 2 (Red)    Extension   Strengthening;Both;20 reps;Theraband    Theraband Level (Shoulder Extension)  Level 3 (Green)    Row  Strengthening;Both;Theraband;20 reps    Theraband Level (Shoulder Row)  Level 3 (Green)    Other Standing Exercises  Triceps ext 25lb 2x10     Other Standing Exercises  biceps curls 15lb 2x10       Shoulder Exercises: ROM/Strengthening   UBE (Upper Arm Bike)  L 3 3 fwd/3 back      Manual Therapy   Manual Therapy  Joint mobilization;Passive ROM    Manual therapy comments  End range pain     Joint Mobilization  L GH grades 2-3    Passive ROM  L shoulder all directions               PT Short Term Goals - 05/15/19 1517      PT SHORT TERM GOAL #1   Title  independent with initial HEP    Status  Achieved        PT Long Term Goals - 05/15/19 1517      PT LONG TERM GOAL #1   Title  understand posture and body mechanics instruction    Status  Achieved  PT LONG TERM GOAL #2   Title  increase AROM of abduction left shoulder to 140 degrees    Status  Achieved      PT LONG TERM GOAL #3   Title  decrease pain 50%    Status  Partially Met      PT LONG TERM GOAL #4   Title  increase left shoulder IR to 70 degrees    Status  Achieved            Plan - 05/15/19 1518    Clinical Impression Statement  Pt continues to do well in therapy. She has been able to progress increasing resistance with rows and extensions. Did report some shoulder pain with flexion and abduction that stopped once the resistance was releases at the bottom of each rep. Some initial end range pain with PROM that lessened at [place].    Personal Factors and Comorbidities  Comorbidity 1    Comorbidities  AAA, be careful with any UE straining    Stability/Clinical Decision Making  Stable/Uncomplicated    Rehab Potential  Good    PT Frequency  2x / week    PT Treatment/Interventions  ADLs/Self Care Home Management;Ultrasound;Moist Heat;Electrical Stimulation;Cryotherapy;Therapeutic  exercise;Therapeutic activities;Patient/family education;Manual techniques;Passive range of motion;Iontophoresis 3m/ml Dexamethasone    PT Next Visit Plan  assess and progress, ionto if pain returns       Patient will benefit from skilled therapeutic intervention in order to improve the following deficits and impairments:  Pain, Improper body mechanics, Increased muscle spasms, Postural dysfunction, Decreased range of motion, Decreased strength, Impaired UE functional use, Impaired flexibility  Visit Diagnosis: Acute pain of left shoulder  Stiffness of left shoulder, not elsewhere classified     Problem List Patient Active Problem List   Diagnosis Date Noted  . Steatosis of liver 01/13/2019  . Renal cyst, left 01/13/2019  . Overweight (BMI 25.0-29.9) 01/13/2019  . OSA (obstructive sleep apnea) 09/02/2017  . [place]ending [place] ([place]   . SOB (shortness of breath) 02/18/2017  . Excessive daytime sleepiness 02/18/2017  . Vitamin D deficiency 09/17/2016  . Physical exam 09/13/2014  . Palpitations 09/13/2014  . Decreased hearing of both ears 09/13/2014  . Glucose intolerance (impaired glucose tolerance) 02/22/2013  . Depression 02/22/2013  . Hypothyroid 02/22/2013    RScot Jun PTA 05/15/2019, 3:22 PM  [address] [address]Phone: [phone]  Fax:  [phone] Name: Ann KILLIANMRN: [id number] of Birth: 914-Mar-1972

## 2019-05-23 ENCOUNTER — Ambulatory Visit: Payer: BC Managed Care – PPO | Attending: Orthopedic Surgery | Admitting: Physical Therapy

## 2019-05-23 ENCOUNTER — Other Ambulatory Visit: Payer: Self-pay

## 2019-05-23 DIAGNOSIS — M25512 Pain in left shoulder: Secondary | ICD-10-CM | POA: Insufficient documentation

## 2019-05-23 DIAGNOSIS — M25612 Stiffness of left shoulder, not elsewhere classified: Secondary | ICD-10-CM | POA: Diagnosis not present

## 2019-05-23 NOTE — Therapy (Signed)
[address] [place], [place], [address] Phone: [phone]   Fax:  [phone]  Physical Therapy Treatment  Patient Details  Name: JEANEE Bass MRN: [id number] Date of Birth: 26-Nov-1970 Referring Provider (PT): Carlynn Spry Date: 05/23/2019  PT End of Session - 05/23/19 1047    Visit Number  4    Date for PT Re-Evaluation  07/03/19    PT Start Time  8891    PT Stop Time  1100    PT Time Calculation (min)  45 min       Past Medical History:  Diagnosis Date  . Allergy   . Depression    post-partum  . Dilated aortic root ([place])    2m by echo 2018 and 454mby echo 12/2018  . Inflammatory [place]ps of colon ([facility]  . OSA (obstructive sleep apnea) 09/02/2017   Mild OSA with an AHI of 6.8/h.  On CPAP  . Palpitations    PACs noted on event monitor    Past Surgical History:  Procedure Laterality Date  . broken leg  1982   Left Femur, traction and pin in leg    There were no vitals filed for this visit.  Subjective Assessment - 05/23/19 1019    Subjective  much bettr just pain with ER    Currently in Pain?  Yes    Pain Score  1     Pain Location  Shoulder    Pain Orientation  Left                       [facility] Adult PT Treatment/Exercise - 05/23/19 0001      Neck Exercises: Machines for Strengthening   UBE (Upper Arm Bike)  L 3 2 fwd/2 back      Shoulder Exercises: Supine   Other Supine Exercises  3# IR/ER 2 sets 10      Shoulder Exercises: Seated   Extension  Strengthening;Both;20 reps;Weights    Extension Weight (lbs)  3    Row  Strengthening;Both;20 reps;Weights    Row Weight (lbs)  3    Horizontal ABduction  Strengthening;Both;20 reps;Weights    Horizontal ABduction Weight (lbs)  3      Shoulder Exercises: Standing   Other Standing Exercises  wall angels 2# 10 x- catch 90-100 degrees   2# 4 pt rhy stab 10 each   Other Standing Exercises  empty can and PNF 2  sets 10 3#      Modalities   Modalities  Iontophoresis      Iontophoresis   Type of Iontophoresis  Dexamethasone    Location  left shld    Dose  1.1 cc dex    Time  8030m hour patch      Manual Therapy   Manual Therapy  Joint mobilization    Manual therapy comments  End range pain     Joint Mobilization  L GH grades 2-3    Passive ROM  IR/ER               PT Short Term Goals - 05/15/19 1517      PT SHORT TERM GOAL #1   Title  independent with initial HEP    Status  Achieved        PT Long Term Goals - 05/23/19 1049      PT LONG TERM GOAL #1   Title  understand posture and body  mechanics instruction      PT LONG TERM GOAL #2   Title  increase AROM of abduction left shoulder to 140 degrees    Status  Achieved      PT LONG TERM GOAL #3   Title  decrease pain 50%    Status  Partially Met      PT LONG TERM GOAL #4   Title  increase left shoulder IR to 70 degrees    Status  Achieved            Plan - 05/23/19 1048    Clinical Impression Statement  pain with ER and painful arc around 90-100 degress. end range tightness and pain esp with ER, responded well to jt mobs and jt capsule stretching. added ionto    PT Treatment/Interventions  ADLs/Self Care Home Management;Ultrasound;Moist Heat;Electrical Stimulation;Cryotherapy;Therapeutic exercise;Therapeutic activities;Patient/family education;Manual techniques;Passive range of motion;Iontophoresis 89m/ml Dexamethasone    PT Next Visit Plan  assess ionto       Patient will benefit from skilled therapeutic intervention in order to improve the following deficits and impairments:  Pain, Improper body mechanics, Increased muscle spasms, Postural dysfunction, Decreased range of motion, Decreased strength, Impaired UE functional use, Impaired flexibility  Visit Diagnosis: Acute pain of left shoulder  Stiffness of left shoulder, not elsewhere classified     Problem List Patient Active Problem List    Diagnosis Date Noted  . Steatosis of liver 01/13/2019  . Renal cyst, left 01/13/2019  . Overweight (BMI 25.0-29.9) 01/13/2019  . OSA (obstructive sleep apnea) 09/02/2017  . Ascending aortic aneurysm ([facility]   . SOB (shortness of breath) 02/18/2017  . Excessive daytime sleepiness 02/18/2017  . Vitamin D deficiency 09/17/2016  . Physical exam 09/13/2014  . Palpitations 09/13/2014  . Decreased hearing of both ears 09/13/2014  . Glucose intolerance (impaired glucose tolerance) 02/22/2013  . Depression 02/22/2013  . Hypothyroid 02/22/2013    Kaeleen Odom,[name] PTA 05/23/2019, 10:50 AM  [address] [address]Phone: [phone]  Fax:  [phone] Name: Ann CHENOWETHMRN: [id number] of Birth: 9Jan 30, 1972

## 2019-05-25 ENCOUNTER — Other Ambulatory Visit: Payer: Self-pay | Admitting: Family Medicine

## 2019-05-30 ENCOUNTER — Other Ambulatory Visit: Payer: Self-pay

## 2019-05-30 ENCOUNTER — Encounter: Payer: Self-pay | Admitting: Physical Therapy

## 2019-05-30 ENCOUNTER — Ambulatory Visit: Payer: BC Managed Care – PPO | Admitting: Physical Therapy

## 2019-05-30 DIAGNOSIS — M25612 Stiffness of left shoulder, not elsewhere classified: Secondary | ICD-10-CM | POA: Diagnosis not present

## 2019-05-30 DIAGNOSIS — M25512 Pain in left shoulder: Secondary | ICD-10-CM

## 2019-05-30 NOTE — Therapy (Signed)
[address] [place], [place], [address] Phone: [phone]   Fax:  [phone]  Physical Therapy Treatment  Patient Details  Name: Ann Bass MRN: [id number] Date of Birth: 13-Nov-1970 Referring Provider (PT): Carlynn Spry Date: 05/30/2019  PT End of Session - 05/30/19 1015    Visit Number  5    Date for PT Re-Evaluation  07/03/19    PT Start Time  1016    PT Stop Time  1055    PT Time Calculation (min)  39 min    Activity Tolerance  Patient tolerated treatment well    Behavior During Therapy  [facility] for tasks assessed/performed       Past Medical History:  Diagnosis Date  . Allergy   . Depression    post-partum  . Dilated aortic root ([place])    103mm by echo 2018 and 1mm by echo 12/2018  . Inflammatory polyps of colon ([place])   . OSA (obstructive sleep apnea) 09/02/2017   Mild OSA with an AHI of 6.8/h.  On CPAP  . Palpitations    PACs noted on event monitor    Past Surgical History:  Procedure Laterality Date  . broken leg  1982   Left Femur, traction and pin in leg    There were no vitals filed for this visit.  Subjective Assessment - 05/30/19 1016    Subjective  Pt reports she tolerated the patch well , feels almost 100% now. Only has stiffness when she pushes the shoulder.    Currently in Pain?  No/denies                       [facility] Adult PT Treatment/Exercise - 05/30/19 0001      Neck Exercises: Machines for Strengthening   UBE (Upper Arm Bike)  L 3 2 fwd/2 back      Shoulder Exercises: Sidelying   External Rotation  Strengthening;Left;Weights   3x10 with towel under arm   External Rotation Weight (lbs)  3    Other Sidelying Exercises  3x10 empty can in small ROM with 3#       Shoulder Exercises: Standing   Row  Strengthening;Both;20 reps;Weights   bent over    Row Weight (lbs)  5    Other Standing Exercises  2x10 bicep curls with hand pronation to lower  5#, overhead press with 3# VC for form    Other Standing Exercises  2x10, 1# abduction to 90 degrees, moving FWD to flexion, lowering and reversing movt.       Shoulder Exercises: ROM/Strengthening   Modified Plank  30 seconds   on edge of table     Shoulder Exercises: Stretch   Other Shoulder Stretches  in hooklying, goal to/fro scarecrow, goal to/fro overhead reach, then flowing through pattern    Other Shoulder Stretches  supine chest stretches      Modalities   Modalities  Iontophoresis      Iontophoresis   Type of Iontophoresis  Dexamethasone    Location  left shld    Dose  1.1 cc dex    Time  46mA 4 hour patch               PT Short Term Goals - 05/30/19 1020      PT SHORT TERM GOAL #1   Title  independent with initial HEP    Status  Achieved  PT Long Term Goals - 05/30/19 1020      PT LONG TERM GOAL #1   Title  understand posture and body mechanics instruction    Baseline  pt wishes to begin an at home exercise program with free wts    Status  Achieved      PT LONG TERM GOAL #2   Title  increase AROM of abduction left shoulder to 140 degrees    Status  Achieved      PT LONG TERM GOAL #3   Title  decrease pain 50%    Baseline  100% improved    Status  Achieved      PT LONG TERM GOAL #4   Title  increase left shoulder IR to 70 degrees    Status  Achieved            Plan - 05/30/19 1019    Clinical Impression Statement  Pt is very happy with her progress, feels like she is close to being ready to stop therapy. She does wish to return to body pump at some time once covid is under control,  Would like to start wtih home workouts using free weights. She did have pain and limited ER on Lt shoulder in hooklying goal post position. Ionto patch placed to decrease risk of pain returning after harder work today.    PT Frequency  2x / week    PT Duration  8 weeks    PT Treatment/Interventions  ADLs/Self Care Home Management;Ultrasound;Moist  Heat;Electrical Stimulation;Cryotherapy;Therapeutic exercise;Therapeutic activities;Patient/family education;Manual techniques;Passive range of motion;Iontophoresis 4mg /ml Dexamethasone    PT Next Visit Plan  conti ionto if needed, add free wts to formal HEP with pictures    Consulted and Agree with Plan of Care  Patient       Patient will benefit from skilled therapeutic intervention in order to improve the following deficits and impairments:  Pain, Improper body mechanics, Increased muscle spasms, Postural dysfunction, Decreased range of motion, Decreased strength, Impaired UE functional use, Impaired flexibility  Visit Diagnosis: Acute pain of left shoulder  Stiffness of left shoulder, not elsewhere classified     Problem List Patient Active Problem List   Diagnosis Date Noted  . Steatosis of liver 01/13/2019  . Renal cyst, left 01/13/2019  . Overweight (BMI 25.0-29.9) 01/13/2019  . OSA (obstructive sleep apnea) 09/02/2017  . Ascending aortic aneurysm ([place])   . SOB (shortness of breath) 02/18/2017  . Excessive daytime sleepiness 02/18/2017  . Vitamin D deficiency 09/17/2016  . Physical exam 09/13/2014  . Palpitations 09/13/2014  . Decreased hearing of both ears 09/13/2014  . Glucose intolerance (impaired glucose tolerance) 02/22/2013  . Depression 02/22/2013  . Hypothyroid 02/22/2013    Jeral Pinch PT  05/30/2019, 10:56 AM  [address] [place], [place], [address] Phone: [phone]   Fax:  [phone]  Name: Ann Bass MRN: [id number] Date of Birth: 1971/03/30

## 2019-06-06 ENCOUNTER — Other Ambulatory Visit: Payer: Self-pay

## 2019-06-06 ENCOUNTER — Ambulatory Visit: Payer: BC Managed Care – PPO | Admitting: Physical Therapy

## 2019-06-06 DIAGNOSIS — M25512 Pain in left shoulder: Secondary | ICD-10-CM | POA: Diagnosis not present

## 2019-06-06 DIAGNOSIS — M25612 Stiffness of left shoulder, not elsewhere classified: Secondary | ICD-10-CM | POA: Diagnosis not present

## 2019-06-06 NOTE — Therapy (Addendum)
[address] [place], [place], [address] Phone: [phone]   Fax:  [phone]  Physical Therapy Treatment  Patient Details  Name: NURY NEBERGALL MRN: [id number] Date of Birth: March 11, 1971 Referring Provider (PT): Carlynn Spry Date: 06/06/2019  PT End of Session - 06/06/19 1045    Visit Number  6    Date for PT Re-Evaluation  07/03/19    PT Start Time  2595    PT Stop Time  1055    PT Time Calculation (min)  40 min       Past Medical History:  Diagnosis Date  . Allergy   . Depression    post-partum  . Dilated aortic root ([place])    63m by echo 2018 and 462mby echo 12/2018  . [place]  . OSA (obstructive sleep apnea) 09/02/2017   Mild OSA with an AHI of 6.8/h.  On CPAP  . Palpitations    PACs noted on event monitor    Past Surgical History:  Procedure Laterality Date  . broken leg  1982   Left Femur, traction and pin in leg    There were no vitals filed for this visit.  Subjective Assessment - 06/06/19 1019    Subjective  overall still doing well but was very sore after ex last session. " I think patch does help"    Currently in Pain?  Yes    Pain Score  1     Pain Location  Shoulder    Pain Orientation  Left         [facility] PT Assessment - 06/06/19 0001      AROM   AROM Assessment Site  Shoulder    Right/Left Shoulder  Left    Left Shoulder Flexion  180 Degrees    Left Shoulder ABduction  180 Degrees    Left Shoulder Internal Rotation  85 Degrees    Left Shoulder External Rotation  90 Degrees      Strength   Overall Strength Comments  5/5 some pain with pain                   [facility] Adult PT Treatment/Exercise - 06/06/19 0001      Neck Exercises: Machines for Strengthening   UBE (Upper Arm Bike)  L 3 2 fwd/2 back      Shoulder Exercises: Prone   Flexion  Strengthening;Left;10 reps;Weights    Flexion Weight (lbs)  5    External  Rotation  Strengthening;Left;10 reps;Weights    External Rotation Weight (lbs)  5    Internal Rotation  Strengthening;Left;10 reps;Weights    Internal Rotation Weight (lbs)  5      Shoulder Exercises: Sidelying   External Rotation  Strengthening;Left;10 reps;Weights    External Rotation Weight (lbs)  5    ABduction  Strengthening;10 reps;Weights;Left    ABduction Weight (lbs)  5      Shoulder Exercises: Standing   Other Standing Exercises  green tband PNF 2 sets 10      Shoulder Exercises: ROM/Strengthening   Other ROM/Strengthening Exercises  15# serratus 2 sets 10   pulley IR?ER 2 sets 10   Other ROM/Strengthening Exercises  15 pulley row, 10# shld ext with pulley. 10 each      Iontophoresis   Type of Iontophoresis  Dexamethasone    Location  left shld    Dose  1.1 cc dex  Time  57m 4 hour patch      Manual Therapy   Manual Therapy  Joint mobilization    Manual therapy comments  End range stretch               PT Short Term Goals - 05/30/19 1020      PT SHORT TERM GOAL #1   Title  independent with initial HEP    Status  Achieved        PT Long Term Goals - 05/30/19 1020      PT LONG TERM GOAL #1   Title  understand posture and body mechanics instruction    Baseline  pt wishes to begin an at home exercise program with free wts    Status  Achieved      PT LONG TERM GOAL #2   Title  increase AROM of abduction left shoulder to 140 degrees    Status  Achieved      PT LONG TERM GOAL #3   Title  decrease pain 50%    Baseline  100% improved    Status  Achieved      PT LONG TERM GOAL #4   Title  increase left shoulder IR to 70 degrees    Status  Achieved            Plan - 06/06/19 1046    Clinical Impression Statement  all goals met. fatigues with ex and some end range pain with wt. did up all wt today and issued blue tband for HEP    PT Treatment/Interventions  ADLs/Self Care Home Management;Ultrasound;Moist Heat;Electrical  Stimulation;Cryotherapy;Therapeutic exercise;Therapeutic activities;Patient/family education;Manual techniques;Passive range of motion;Iontophoresis 475mml Dexamethasone    PT Next Visit Plan  conti ionto if needed, add free wts to formal HEP with pictures       Patient will benefit from skilled therapeutic intervention in order to improve the following deficits and impairments:  Pain, Improper body mechanics, Increased muscle spasms, Postural dysfunction, Decreased range of motion, Decreased strength, Impaired UE functional use, Impaired flexibility  Visit Diagnosis: Stiffness of left shoulder, not elsewhere classified  Acute pain of left shoulder     Problem List Patient Active Problem List   Diagnosis Date Noted  . Steatosis of liver 01/13/2019  . Renal cyst, left 01/13/2019  . Overweight (BMI 25.0-29.9) 01/13/2019  . OSA (obstructive sleep apnea) 09/02/2017  . [place]  . SOB (shortness of breath) 02/18/2017  . Excessive daytime sleepiness 02/18/2017  . Vitamin D deficiency 09/17/2016  . Physical exam 09/13/2014  . Palpitations 09/13/2014  . Decreased hearing of both ears 09/13/2014  . Glucose intolerance (impaired glucose tolerance) 02/22/2013  . Depression 02/22/2013  . Hypothyroid 02/22/2013   PHYSICAL THERAPY DISCHARGE SUMMARY   Plan: Patient agrees to discharge.  Patient goals were met. Patient is being discharged due to being pleased with the current functional level.  ?????      PAYSEUR,[name] PTA 06/06/2019, 10:47 AM  [id number]: [phone] Fax:  [phone]: MaTONEKA FULLENRN: [id number] of Birth: 9/April 21, 1971

## 2019-06-13 ENCOUNTER — Ambulatory Visit: Payer: BC Managed Care – PPO | Admitting: Physical Therapy

## 2019-06-14 ENCOUNTER — Other Ambulatory Visit: Payer: Self-pay | Admitting: Obstetrics and Gynecology

## 2019-06-14 DIAGNOSIS — N939 Abnormal uterine and vaginal bleeding, unspecified: Secondary | ICD-10-CM

## 2019-06-14 DIAGNOSIS — Z1231 Encounter for screening mammogram for malignant neoplasm of breast: Secondary | ICD-10-CM | POA: Diagnosis not present

## 2019-06-14 DIAGNOSIS — D259 Leiomyoma of uterus, unspecified: Secondary | ICD-10-CM | POA: Diagnosis not present

## 2019-06-14 DIAGNOSIS — Z3040 Encounter for surveillance of contraceptives, unspecified: Secondary | ICD-10-CM | POA: Diagnosis not present

## 2019-06-14 DIAGNOSIS — E559 Vitamin D deficiency, unspecified: Secondary | ICD-10-CM | POA: Diagnosis not present

## 2019-06-14 DIAGNOSIS — Z01419 Encounter for gynecological examination (general) (routine) without abnormal findings: Secondary | ICD-10-CM | POA: Diagnosis not present

## 2019-06-14 DIAGNOSIS — Z124 Encounter for screening for malignant neoplasm of cervix: Secondary | ICD-10-CM | POA: Diagnosis not present

## 2019-06-14 LAB — HM PAP SMEAR

## 2019-06-14 LAB — HM MAMMOGRAPHY: HM Mammogram: NORMAL (ref 0–4)

## 2019-06-14 LAB — VITAMIN D 25 HYDROXY (VIT D DEFICIENCY, FRACTURES): Vit D, 25-Hydroxy: 58.3

## 2019-06-19 ENCOUNTER — Encounter: Payer: Self-pay | Admitting: General Practice

## 2019-06-21 ENCOUNTER — Ambulatory Visit
Admission: RE | Admit: 2019-06-21 | Discharge: 2019-06-21 | Disposition: A | Discharge status: Home / Self Care | Payer: BC Managed Care – PPO | Source: Ambulatory Visit | Attending: Obstetrics and Gynecology | Admitting: Obstetrics and Gynecology

## 2019-06-21 DIAGNOSIS — D251 Intramural leiomyoma of uterus: Secondary | ICD-10-CM | POA: Diagnosis not present

## 2019-06-21 DIAGNOSIS — N939 Abnormal uterine and vaginal bleeding, unspecified: Secondary | ICD-10-CM

## 2019-06-22 DIAGNOSIS — D259 Leiomyoma of uterus, unspecified: Secondary | ICD-10-CM | POA: Diagnosis not present

## 2019-06-25 ENCOUNTER — Other Ambulatory Visit: Payer: Self-pay | Admitting: Family Medicine

## 2019-06-27 ENCOUNTER — Other Ambulatory Visit: Payer: Self-pay | Admitting: Family Medicine

## 2019-06-28 DIAGNOSIS — R928 Other abnormal and inconclusive findings on diagnostic imaging of breast: Secondary | ICD-10-CM | POA: Diagnosis not present

## 2019-06-29 ENCOUNTER — Other Ambulatory Visit: Payer: Self-pay | Admitting: Family Medicine

## 2019-07-05 ENCOUNTER — Other Ambulatory Visit: Payer: Self-pay | Admitting: Obstetrics and Gynecology

## 2019-07-25 ENCOUNTER — Other Ambulatory Visit: Payer: Self-pay

## 2019-07-25 ENCOUNTER — Telehealth: Payer: Self-pay | Admitting: *Deleted

## 2019-07-25 ENCOUNTER — Encounter: Payer: Self-pay | Admitting: Cardiology

## 2019-07-25 ENCOUNTER — Ambulatory Visit (INDEPENDENT_AMBULATORY_CARE_PROVIDER_SITE_OTHER): Payer: BC Managed Care – PPO | Admitting: Cardiology

## 2019-07-25 VITALS — BP 118/80 | HR 72 | Ht 62.0 in | Wt 152.0 lb

## 2019-07-25 DIAGNOSIS — Z01818 Encounter for other preprocedural examination: Secondary | ICD-10-CM

## 2019-07-25 DIAGNOSIS — I712 Thoracic aortic aneurysm, without rupture, unspecified: Secondary | ICD-10-CM

## 2019-07-25 DIAGNOSIS — I491 Atrial premature depolarization: Secondary | ICD-10-CM | POA: Diagnosis not present

## 2019-07-25 DIAGNOSIS — I7121 Aneurysm of the ascending aorta, without rupture: Secondary | ICD-10-CM

## 2019-07-25 DIAGNOSIS — G4733 Obstructive sleep apnea (adult) (pediatric): Secondary | ICD-10-CM

## 2019-07-25 DIAGNOSIS — I10 Essential (primary) hypertension: Secondary | ICD-10-CM

## 2019-07-25 NOTE — Telephone Encounter (Signed)
Primary Cardiologist:Traci Turner, MD  Chart reviewed as part of pre-operative protocol coverage. Because of Elysse Franzel Ehresman's past medical history and time since last visit, he/she will require a follow-up visit in order to better assess preoperative cardiovascular risk.  Pre-op covering staff: - Please schedule appointment and call patient to inform them. - Please contact requesting surgeon's office via preferred method (i.e, phone, fax) to inform them of need for appointment prior to surgery.  If applicable, this message will also be routed to pharmacy pool and/or primary cardiologist for input on holding anticoagulant/antiplatelet agent as requested below so that this information is available at time of patient's appointment.    Jossie Ng. [facility] [address] Office [phone] Fax [phone]

## 2019-07-25 NOTE — Progress Notes (Signed)
Cardiology Office Note:    Date:  07/25/2019   ID:  Ann Bass, DOB 1970-12-10, MRN [id number]  PCP:  [name], MD  Cardiologist:  Fransico Him, MD    Referring MD: [name], MD   Chief Complaint  Patient presents with  . Follow-up    OSA, dialted ascending aorta, PACs    History of Present Illness:    Ann Bass is a 49 y.o. female with a hx of palpitations and shortness of breath. 2D echocardiogram showed normal LV function with mildly dilated a sending aorta at 39 mmHg otherwise normal 2D echocardiogram. Exercise stress test noted showed no inducible ischemia. Event monitor was normal except for a rare PAC. She also hasmild obstructive sleep apnea with an AHI of 6.8/hr. Lowest oxygen saturation drop was 82%. She is on CPAP.   She is here today for followup and is doing well.  She is here for preoperative cardiac clearance prior to undergoing hysterectomy for uterine fibroids.  She denies any chest pain or pressure, PND, orthopnea, LE edema, dizziness,  or syncope. She has always had some DOE but that has been stable through the years. She is able to walk several blocks and 1-2 flights of stairs with no worsening of her chronic DOE and no CP.  She is compliant with her meds and is tolerating meds with no SE.     She is doing well with her CPAP device and thinks that she has gotten used to it.  She tolerates the mask and feels the pressure is adequate.  Since going on CPAP she feels rested in the am and has no significant daytime sleepiness.  She denies any significant mouth or nasal dryness or nasal congestion.  She does not think that he snores.     Past Medical History:  Diagnosis Date  . Allergy   . Depression    post-partum  . Dilated aortic root ([place])    46mm by echo 2018 and 65mm by echo 12/2018  . Inflammatory polyps of colon ([place])   . OSA (obstructive sleep apnea) 09/02/2017   Mild OSA with an AHI of 6.8/h.  On CPAP  . Palpitations     PACs noted on event monitor    Past Surgical History:  Procedure Laterality Date  . broken leg  1982   Left Femur, traction and pin in leg    Current Medications: Current Meds  Medication Sig  . ARMOUR THYROID 15 MG tablet TAKE 1 TABLET ON MONDAY AND THURSDAY (WITH 60MG  TABLET FOR 75MG  TOTALDOSE)  . ARMOUR THYROID 60 MG tablet TAKE 1 TABLET (60 MG TOTAL) BY MOUTH DAILY. AS DIRECTED  . Ascorbic Acid (VITAMIN C) 1000 MG tablet Take 1,000 mg by mouth daily.  . citalopram (CELEXA) 20 MG tablet TAKE 1 TABLET BY MOUTH EVERY DAY  . fenofibrate 160 MG tablet TAKE 1 TABLET BY MOUTH EVERY DAY  . Fexofenadine HCl (ALLEGRA PO) Take 1 tablet by mouth daily.  Lenda Kelp 1/20 1-20 MG-MCG tablet Take 1 tablet by mouth daily.  . Multiple Vitamin (MULTIVITAMIN) tablet Take 1 tablet by mouth daily.  . Omega-3 Fatty Acids (FISH OIL PO) Take by mouth.     Allergies:   Penicillins   Social History   Socioeconomic History  . Marital status: Married    Spouse name: Not on file  . Number of children: Not on file  . Years of education: Not on file  . Highest education level: Not on  file  Occupational History  . Not on file  Tobacco Use  . Smoking status: Never Smoker  . Smokeless tobacco: Never Used  Substance and Sexual Activity  . Alcohol use: Yes  . Drug use: No  . Sexual activity: Yes  Other Topics Concern  . Not on file  Social History Narrative  . Not on file   Social Determinants of Health   Financial Resource Strain:   . Difficulty of Paying Living Expenses: Not on file  Food Insecurity:   . Worried About Charity fundraiser in the Last Year: Not on file  . Ran Out of Food in the Last Year: Not on file  Transportation Needs:   . Lack of Transportation (Medical): Not on file  . Lack of Transportation (Non-Medical): Not on file  Physical Activity:   . Days of Exercise per Week: Not on file  . Minutes of Exercise per Session: Not on file  Stress:   . Feeling of Stress : Not on  file  Social Connections:   . Frequency of Communication with Friends and Family: Not on file  . Frequency of Social Gatherings with Friends and Family: Not on file  . Attends Religious Services: Not on file  . Active Member of Clubs or Organizations: Not on file  . Attends Archivist Meetings: Not on file  . Marital Status: Not on file     Family History: The patient's family history includes Cancer in her father and paternal grandmother; Diabetes in her mother.  ROS:   Please see the history of present illness.    ROS  All other systems reviewed and negative.   EKGs/Labs/Other Studies Reviewed:    The following studies were reviewed today: PAP compliance download from [facility]   EKG:  EKG is  ordered today.  The ekg ordered today demonstrates NSR with no ST changes  Recent Labs: 01/13/2019: ALT 18; BUN 14; Creatinine, Ser 0.60; Hemoglobin 15.8; Platelets 285.0; Potassium 3.9; Sodium 135; TSH 0.89   Recent Lipid Panel    Component Value Date/Time   CHOL 223 (H) 01/13/2019 1208   TRIG (H) 01/13/2019 1208    517.0 Triglyceride is over 400; calculations on Lipids are invalid.   HDL 37.60 (L) 01/13/2019 1208   CHOLHDL 6 01/13/2019 1208   VLDL 47.4 (H) 09/17/2016 0837   LDLCALC 119 08/26/2017 0000   LDLDIRECT 155.0 01/13/2019 1208    Physical Exam:    VS:  BP 118/80   Pulse 72   Ht 5\' 2"  (1.575 m)   Wt 152 lb (68.9 kg)   BMI 27.80 kg/m     Wt Readings from Last 3 Encounters:  07/25/19 152 lb (68.9 kg)  01/13/19 150 lb (68 kg)  09/29/18 145 lb (65.8 kg)     GEN:  Well nourished, well developed in no acute distress HEENT: Normal NECK: No JVD; No carotid bruits LYMPHATICS: No lymphadenopathy CARDIAC: RRR, no murmurs, rubs, gallops RESPIRATORY:  Clear to auscultation without rales, wheezing or rhonchi  ABDOMEN: Soft, non-tender, non-distended MUSCULOSKELETAL:  No edema; No deformity  SKIN: Warm and dry NEUROLOGIC:  Alert and oriented x 3 PSYCHIATRIC:   Normal affect   ASSESSMENT:    1. OSA (obstructive sleep apnea)   2. Ascending aortic aneurysm ([place])   3. PAC (premature atrial contraction)   4. Preoperative clearance    PLAN:    In order of problems listed above:  1.  OSA - The patient is tolerating PAP therapy well  without any problems. The PAP download was reviewed today and showed an AHI of 0.1/hr on auto PAP with 83% compliance in using more than 4 hours nightly.  The patient has been using and benefiting from PAP use and will continue to benefit from therapy.   2.  Dilated ascending aorta -echo and Chest CTA 12/2018 showed ascending aorta dimension around 15mm. -will get a cardiac MRI 12/2019 to reassess -BP controlled -her LDL was 155 in Aug 2020 -repeat FLP  3.  PACs -these do not seem to be too bothersome  4.  Preoperative Cardiac Clearance -she can complete at least 4.5 mets with no limitations and no anginal sx and therefore no further ischemic workup indicated. -her revised cardiac risk score is low at 0.4% periop risk of major cardiac event  Medication Adjustments/Labs and Tests Ordered: Current medicines are reviewed at length with the patient today.  Concerns regarding medicines are outlined above.  Orders Placed This Encounter  Procedures  . EKG 12-Lead   No orders of the defined types were placed in this encounter.   Signed, Fransico Him, MD  07/25/2019 3:13 PM    [place]

## 2019-07-25 NOTE — Patient Instructions (Signed)
Medication Instructions:  Your physician recommends that you continue on your current medications as directed. Please refer to the Current Medication list given to you today.  *If you need a refill on your cardiac medications before your next appointment, please call your pharmacy*  Lab Work: BMET prior to MRI  If you have labs (blood work) drawn today and your tests are completely normal, you will receive your results only by: Marland Kitchen MyChart Message (if you have MyChart) OR . A paper copy in the mail If you have any lab test that is abnormal or we need to change your treatment, we will call you to review the results.   Testing/Procedures: Your physician has requested that you have a cardiac MRI. Cardiac MRI uses a computer to create images of your heart as its beating, producing both still and moving pictures of your heart and major blood vessels. For further information please visit [email or web address] Please follow the instruction sheet given to you today for more information.   Follow-Up: At [facility], you and your health needs are our priority.  As part of our continuing mission to provide you with exceptional heart care, we have created designated Provider Care Teams.  These Care Teams include your primary Cardiologist (physician) and Advanced Practice Providers (APPs -  Physician Assistants and Nurse Practitioners) who all work together to provide you with the care you need, when you need it.  Your next appointment:   1 year(s)  The format for your next appointment:   Either In Person or Virtual  Provider:   Fransico Him, MD   Other Instructions  Magnetic Resonance Angiogram Magnetic resonance imaging (MRI) is a painless test that produces images of the inside of the body without using X-rays. During an MRI, strong magnets and radio waves work together in a [employer] officer, political party to form detailed images. MRI images may provide more details about a medical condition than X-rays, CT  scans, and ultrasounds can provide. A magnetic resonance angiogram (MRA) is an MRI done on your blood vessels. During an MRA, dye (contrast material or contrast dye) is injected into your body to make the images even clearer. MRA provides images of blood vessels and blood flow. It can be used to help diagnose and treat heart disorders, stroke, and blood vessel diseases. Tell a health care provider about:  Any surgeries you have had.  Any metal you may have in your body. The magnet used in MRA can cause metal objects in your body to move. Metal can also make it hard to get high-quality images. Objects that may contain metal include: ? Any joint replacement (prosthesis), such as an artificial knee or hip. ? An implanted defibrillator, pacemaker, or neurostimulator. ? A metallic ear implant (cochlear implant). ? An artificial heart valve. ? A metallic object in the eye socket. ? Metal splinters. ? Bullet fragments. ? A port for delivering insulin or chemotherapy.  Any tattoos you have. Some red dyes contain iron, which can cause problems with testing.  Whether you are pregnant, may be pregnant, or are breastfeeding.  Any fear of cramped spaces (claustrophobia). If this is a problem, it usually can be relieved with medicines.  Any allergies you have.  All medicines you are taking, including vitamins, herbs, eye drops, creams, and over-the-counter medicines. What are the risks? Generally, this is a safe test. However, problems can occur:  If you have metal in your body, it may be affected by the magnet used during the test. If you have  a metallic implant close to the area being tested, it may be hard to get high-quality images.  Allergic reaction to the contrast dye is possible.  If you are pregnant, you should avoid MRI tests, including MRA, during the first three months of pregnancy. MRI may have effects on an unborn baby.  If you are breastfeeding, you may need to stop temporarily.  Your breast milk may contain contrast material until the material naturally leaves your body. What happens before the procedure?  You will be asked to remove all metal, including: ? Your watch, jewelry, and other metal objects. ? Hearing aids. ? Dentures. ? Underwire bra. ? Makeup. Certain kinds of makeup contain small amounts of metal. ? Braces and fillings normally are not a problem.  If you are breastfeeding, follow instructions from your health care provider about pumping before your test and stopping breastfeeding temporarily. What happens during the procedure?   You may be given earplugs or headphones to listen to music. The machine can be noisy.  An IV will be inserted into one of your veins.  Contrast material will be injected into your IV.  You will lie down on a platform, similar to a long table.  The platform will slide into a long tunnel that has magnets inside of it. When you are inside the tunnel, you will still be able to talk to your health care provider.  You will be asked to lie very still while images are taken. Your health care provider will tell you when you can move. You may have to wait a few minutes to make sure that the images produced during the test are readable.  When all images are produced, the platform will slide out of the tunnel. The procedure may vary among health care providers and hospitals. What happens after the procedure?  You may return to your normal activities right away, or as told by your health care provider.  Contrast material will leave your body through your urine within a day. You may be told to drink plenty of fluids to help flush the contrast material out of your system.  If you are breastfeeding, do not breastfeed your child until your health care provider says that this is safe.  It is up to you to get your test results. Ask your health care provider, or the department that is doing the test, when your results will be  ready. Summary  Magnetic resonance imaging (MRI) is a painless test that produces detailed pictures of the inside of your body without using X-rays. Instead, strong magnets and radio waves work together in a Research officer, political party to form very detailed and sharp images.  An MRI done on your blood vessels is called a magnetic resonance angiogram (MRA).  An MRA produces detailed images of your blood vessels and blood flow. It can be used to help diagnose and treat heart disorders, stroke, and blood vessel diseases.  Before the test, be sure to tell your health care provider about any metal you may have in your body.  Talk with your health care provider about what your results mean. This information is not intended to replace advice given to you by your health care provider. Make sure you discuss any questions you have with your health care provider. Document Revised: 04/16/2017 Document Reviewed: 03/29/2017 Elsevier Patient Education  2020 [employer].

## 2019-07-25 NOTE — Telephone Encounter (Signed)
S/w pt and she has been made aware she needs appt for pre op clearance. I scheduled pt to see Dr. Radford Pax today 07/25/19 @ 2:40. Pt said she needs to find help for someone to pick her kids up from school. Pt said she will call back if she has to change the appt. I advised her if she does need to change appt then to be sure she needs appt for pre op clearance. I will send clearance notes to Dr. Radford Pax for upcoming appt today.

## 2019-07-25 NOTE — Telephone Encounter (Signed)
   [facility] Pre-operative Risk Assessment    Request for surgical clearance:  1. What type of surgery is being performed? ROBOTIC HYSTERECTOMY    2. When is this surgery scheduled? 08/10/19   3. What type of clearance is required (medical clearance vs. Pharmacy clearance to hold med vs. Both)? MEDICAL  4. Are there any medications that need to be held prior to surgery and how long? NONE LISTED   5. Practice name and name of physician performing surgery? [facility]; DR. [place] Simon   6. What is your office phone number [phone]    7.   What is your office fax number [phone]  8.   Anesthesia type (None, local, MAC, general) ? GENERAL   Ann Bass 07/25/2019, 10:35 AM  _________________________________________________________________   (provider comments below)

## 2019-07-26 DIAGNOSIS — N939 Abnormal uterine and vaginal bleeding, unspecified: Secondary | ICD-10-CM | POA: Diagnosis not present

## 2019-07-26 NOTE — Telephone Encounter (Signed)
   Primary Cardiologist: Fransico Him, MD  Chart reviewed as part of pre-operative protocol coverage. Ann Bass was seen in the office on 07/25/2019 by Dr. Radford Pax. The patient can complete at least 4.5 mets with no limitations and no anginal sx and therefore no further ischemic workup indicated. Her revised cardiac risk score is low at 0.4% periop risk of major cardiac event. (can see full office note)  I will route this recommendation to the requesting party via Epic fax function and remove from pre-op pool.  Please call with questions.  Daune Perch, NP 07/26/2019, 11:10 AM

## 2019-07-26 NOTE — H&P (Signed)
Ann Bass is a 49 y.o.  female, P: 3-0-1-3  is  presenting for hysterectomy because of abnormal uterine bleeding and pelvic pain. The patient has been on continuous oral contraceptives  for approximately 2 years due to a history of heavy and painful menstrual periods. This regimen managed her symptoms well,  until January 2021 when she began daily bleeding ranging from the use of a single pad daily to having to change a pad hourly. She goes on to report cramping that she rates as 7/10 on days that she flows heavy but is relieved with Ibuprofen 800 mg. She admits to urinary hesitancy and problems completely emptying her bladder at times, but denies dyspareunia, lower back pain, changes in bowel function, vaginitis symptoms,  urinary frequency or urgency. A pelvic ultrasound in February 2021 revealed: a retroflexed uterus: (545 mL): 12.0 x 9.5 x 9.1 cm; # 3 fibroids: anterior lower body subserosal-4.5 cm; anterior exophytic fibroid-2.9 cm and anterior intramural-4.1 cm ; endometrium: 5 mm; right ovary-3.2 cm and left ovary- 3.6 cm. An endometrial biopsy at the preoperative exam returned no endometrial hyperplasia, cervical dysplasia, atypia or malignancy.  A review of both medical and surgical management options were given to the patient, however, given her failed response to hormonal therapy and the disruptive nature of her symptoms,  she has decided to proceed with definitive therapy in the form of hysterectomy.   Past Medical History  OB History: G: 4;  P: 3-0-1-3;  SVB: 2005, 2007 and 2011 (largest infant weighed 7 lbs. 6 oz.)  GYN History: menarche: 49 YO;    LMP: see HPI;    Contraception: Oral Contraceptives;  Denies history of abnormal PAP smear;   Last PAP smear: 2021-normal with negative HPV  Medical History: Obstructive Sleep Apnea (has CPAP); Palpitations (PACs); Colon Polyps;  Post Partum Depression,  Vitamin D Deficiency, Dilated Aortic Root, Hypothyroidism; Left Renal Cyst, Liver  Steatosis Gestational Diabetes and Left Shoulder Pain  Surgical History:  1982 Left Femur ORIF with pin placement due to fracture (pin has since been removed) Denies problems with anesthesia or history of blood transfusions  Family History: Diabetes Mellitus, Colon Cancer and Ovarian Cancer  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 Penicillins Rash    Denies sensitivity to peanuts, shellfish, soy, latex or adhesives.   ROS: Admits to glasses, wears Invisilign, has chronic shortness of breath and developed body rash when she gets too hot; she denies headache, vision changes, nasal congestion, dysphagia, tinnitus, dizziness, hoarseness, cough,  chest pain,  nausea, vomiting, diarrhea,constipation,  urinary frequency, urgency  dysuria, hematuria, vaginitis symptoms,  swelling of joints,easy bruising,  myalgias, arthralgias,  unexplained weight loss and except as is mentioned in the history of present illness, patient's review of systems is otherwise negative.     Physical Exam  Bp:114/78   P: 65 bpm  Temperature: 98.4 degrees F (temporal)   Weight: 151 lbs.  Height: 5'2.25"  BMI: 27.4  Neck: supple without masses or thyromegaly Lungs: clear to auscultation Heart: regular rate and rhythm Abdomen: soft, non-tender and no organomegaly Pelvic:EGBUS- wnl; vagina-normal rugae; uterus-retroverted  and 12-14 weeks l size, cervix without lesions or motion tenderness; adnexae-no tenderness or masses Extremities:  no clubbing, cyanosis or edema   Assesment: Abnormal Uterine Bleeding                      Uterine  Fibroids   Disposition:  A discussion was held with patient regarding the indication for her procedure(s) along with the risks, which include but are not limited to: reaction to anesthesia, damage to adjacent organs, Infection, excessive bleeding, formation of scar tissue, early menopause, pelvic prolapse and the possible need for an open abdominal incision. She was further advised that she will experience transient post operative facial edema, that her hospital stay is expected to be 0-1 days, she should be able to return to her usual activities within 2-3 weeks (except intercourse to be delayed until 6 weeks) and that the robotic approach to her surgery requires more time to perform than an open abdominal approach. Patient was given the Miralax bowel prep to be completed the day  prior to procedure. She verbalized understanding of these risks and pre-operative instructions and has consented to proceed with a Robot Assisted Hysterectomy with Bilateral Salpingectomy at [facility] on August 24, 2019 @ 7:30 a.m.  CSN# [id number]   Macayla Ekdahl J. Florene Glen, PA-C  for Dr.  Dede Query. Rivard

## 2019-07-27 ENCOUNTER — Other Ambulatory Visit: Payer: Self-pay

## 2019-07-27 ENCOUNTER — Encounter: Payer: Self-pay | Admitting: Family Medicine

## 2019-07-27 ENCOUNTER — Ambulatory Visit (INDEPENDENT_AMBULATORY_CARE_PROVIDER_SITE_OTHER): Payer: BC Managed Care – PPO | Admitting: Family Medicine

## 2019-07-27 VITALS — BP 121/81 | HR 82 | Temp 98.0°F | Resp 16 | Ht 62.0 in | Wt 151.2 lb

## 2019-07-27 DIAGNOSIS — E039 Hypothyroidism, unspecified: Secondary | ICD-10-CM | POA: Diagnosis not present

## 2019-07-27 DIAGNOSIS — E663 Overweight: Secondary | ICD-10-CM

## 2019-07-27 DIAGNOSIS — E559 Vitamin D deficiency, unspecified: Secondary | ICD-10-CM | POA: Diagnosis not present

## 2019-07-27 DIAGNOSIS — Z0000 Encounter for general adult medical examination without abnormal findings: Secondary | ICD-10-CM

## 2019-07-27 LAB — CBC WITH DIFFERENTIAL/PLATELET
Basophils Absolute: 0 10*3/uL (ref 0.0–0.1)
Basophils Relative: 0.8 % (ref 0.0–3.0)
Eosinophils Absolute: 0.3 10*3/uL (ref 0.0–0.7)
Eosinophils Relative: 5.3 % — ABNORMAL HIGH (ref 0.0–5.0)
HCT: 44 % (ref 36.0–46.0)
Hemoglobin: 14.9 g/dL (ref 12.0–15.0)
Lymphocytes Relative: 28.8 % (ref 12.0–46.0)
Lymphs Abs: 1.5 10*3/uL (ref 0.7–4.0)
MCHC: 33.8 g/dL (ref 30.0–36.0)
MCV: 89.5 fl (ref 78.0–100.0)
Monocytes Absolute: 0.3 10*3/uL (ref 0.1–1.0)
Monocytes Relative: 5.4 % (ref 3.0–12.0)
Neutro Abs: 3.2 10*3/uL (ref 1.4–7.7)
Neutrophils Relative %: 59.7 % (ref 43.0–77.0)
Platelets: 306 10*3/uL (ref 150.0–400.0)
RBC: 4.92 Mil/uL (ref 3.87–5.11)
RDW: 13.3 % (ref 11.5–15.5)
WBC: 5.3 10*3/uL (ref 4.0–10.5)

## 2019-07-27 LAB — BASIC METABOLIC PANEL
BUN: 11 mg/dL (ref 6–23)
CO2: 27 mEq/L (ref 19–32)
Calcium: 9.7 mg/dL (ref 8.4–10.5)
Chloride: 106 mEq/L (ref 96–112)
Creatinine, Ser: 0.74 mg/dL (ref 0.40–1.20)
GFR: 83.6 mL/min (ref >60.00)
Glucose, Bld: 94 mg/dL (ref 70–99)
Potassium: 4.4 mEq/L (ref 3.5–5.1)
Sodium: 138 mEq/L (ref 135–145)

## 2019-07-27 LAB — LIPID PANEL
Cholesterol: 201 mg/dL — ABNORMAL HIGH (ref 0–200)
HDL: 41.5 mg/dL (ref >39.00)
LDL Cholesterol: 139 mg/dL — ABNORMAL HIGH (ref 0–99)
NonHDL: 159.18
Total CHOL/HDL Ratio: 5
Triglycerides: 101 mg/dL (ref 0.0–149.0)
VLDL: 20.2 mg/dL (ref 0.0–40.0)

## 2019-07-27 LAB — HEPATIC FUNCTION PANEL
ALT: 18 U/L (ref 0–35)
AST: 15 U/L (ref 0–37)
Albumin: 4.2 g/dL (ref 3.5–5.2)
Alkaline Phosphatase: 38 U/L — ABNORMAL LOW (ref 39–117)
Bilirubin, Direct: 0.1 mg/dL (ref 0.0–0.3)
Total Bilirubin: 0.4 mg/dL (ref 0.2–1.2)
Total Protein: 6.7 g/dL (ref 6.0–8.3)

## 2019-07-27 LAB — TSH: TSH: 0.9 u[IU]/mL (ref 0.35–4.50)

## 2019-07-27 NOTE — Patient Instructions (Signed)
Follow up in 1 year or as needed We'll notify you of your lab results and make any changes if needed Continue to work on healthy diet and regular exercise- you can do it! Call with any questions or concerns Stay Safe!  Stay Healthy! GOOD LUCK WITH SURGERY!!!

## 2019-07-27 NOTE — Assessment & Plan Note (Signed)
Ongoing issue for pt.  Encouraged healthy diet and regular exercise.  Will follow. °

## 2019-07-27 NOTE — Progress Notes (Signed)
   Subjective:    Patient ID: Ann Bass, female    DOB: May 10, 1971, 49 y.o.   MRN: [id number]  HPI CPE- UTD on mammo, pap, Tdap, flu.  No concerns today   Review of Systems Patient reports no vision/ hearing changes, adenopathy,fever, weight change,  persistant/recurrent hoarseness , swallowing issues, chest pain, palpitations, edema, persistant/recurrent cough, hemoptysis, dyspnea (rest/exertional/paroxysmal nocturnal), gastrointestinal bleeding (melena, rectal bleeding), abdominal pain, significant heartburn, bowel changes, GU symptoms (dysuria, hematuria, incontinence), Gyn symptoms (abnormal  bleeding, pain),  syncope, focal weakness, memory loss, numbness & tingling, skin/hair/nail changes, abnormal bruising or bleeding, anxiety, or depression.   + heat intolerance  This visit occurred during the SARS-CoV-2 public health emergency.  Safety protocols were in place, including screening questions prior to the visit, additional usage of staff PPE, and extensive cleaning of exam room while observing appropriate contact time as indicated for disinfecting solutions.       Objective:   Physical Exam General Appearance:    Alert, cooperative, no distress, appears stated age  Head:    Normocephalic, without obvious abnormality, atraumatic  Eyes:    PERRL, conjunctiva/corneas clear, EOM's intact, fundi    benign, both eyes  Ears:    Normal TM's and external ear canals, both ears  Nose:   Deferred due to COVID  Throat:   Neck:   Supple, symmetrical, trachea midline, no adenopathy;    Thyroid: no enlargement/tenderness/nodules  Back:     Symmetric, no curvature, ROM normal, no CVA tenderness  Lungs:     Clear to auscultation bilaterally, respirations unlabored  Chest Wall:    No tenderness or deformity   Heart:    Regular rate and rhythm, S1 and S2 normal, no murmur, rub   or gallop  Breast Exam:    Deferred to GYN  Abdomen:     Soft, non-tender, bowel sounds active all four quadrants,      no masses, no organomegaly  Genitalia:    Deferred to GYN  Rectal:    Extremities:   Extremities normal, atraumatic, no cyanosis or edema  Pulses:   2+ and symmetric all extremities  Skin:   Skin color, texture, turgor normal, no rashes or lesions  Lymph nodes:   Cervical, supraclavicular, and axillary nodes normal  Neurologic:   CNII-XII intact, normal strength, sensation and reflexes    throughout          Assessment & Plan:

## 2019-07-27 NOTE — Assessment & Plan Note (Signed)
Ongoing issue.  Currently having heat intolerance.  Check labs.  Adjust meds prn

## 2019-07-27 NOTE — Assessment & Plan Note (Signed)
Pt had recent labs done at [place]

## 2019-07-27 NOTE — Assessment & Plan Note (Signed)
Pt's PE WNL and unchanged from previous.  UTD on GYN.  UTD on immunizations.  Check labs.  Anticipatory guidance provided.

## 2019-07-28 ENCOUNTER — Other Ambulatory Visit: Payer: Self-pay | Admitting: Family Medicine

## 2019-07-28 DIAGNOSIS — R6889 Other general symptoms and signs: Secondary | ICD-10-CM

## 2019-08-16 NOTE — Patient Instructions (Addendum)
Get your Covid test at [place] on 08/18/19 at 1:45   Ann Bass    Your procedure is scheduled on  08/24/19   Report to [facility]  At 5:30  A.M.   Call this number if you have problems the morning of surgery:[phone]   OUR ADDRESS IS [address], WE ARE LOCATED IN THE MEDICAL PLAZA WITH [facility].   Remember:  Do not eat food After Midnight.   YOU MAY HAVE CLEAR LIQUIDS FROM MIDNIGHT UNTIL 4:30 AM.   At 4:30 AM Please finish the prescribed Pre-Surgery  drink.   Nothing by mouth after you finish the  drink !   Take these medicines the morning of surgery with A SIP OF WATER: citalopram, Allegra, Armour Thyroid. Bring your mask, tubing and C-PAP machine. Leave in the car.   Do not wear jewelry, make-up or nail polish.  Do not wear lotions, powders, or perfumes, or deoderant.  Do not shave 48 hours prior to surgery.  Men may shave face and neck.  Do not bring valuables to the hospital.  [facility] is not responsible for any belongings or valuables.  Contacts, dentures or bridgework may not be worn into surgery.   For patients admitted to the hospital, discharge time will be determined by your treatment team.  Patients discharged the day of surgery will not be allowed to drive home.   Special instructions:  Bring your meds in their original bottles  Please read over the following fact sheets that you were given:       [facility] - Preparing for Surgery Before surgery, you can play an important role .  Because skin is not sterile, your skin needs to be as free of germs as possible.   You can reduce the number of germs on your skin by washing with CHG (chlorahexidine gluconate) soap before surgery.   CHG is an antiseptic cleaner which kills germs and bonds with the skin to continue killing germs even after washing. Please DO NOT use if you have an allergy to CHG or antibacterial soaps .  If your skin becomes  reddened/irritated stop using the CHG and inform your nurse when you arrive at Short Stay. Do not shave (including legs and underarms) for at least 48 hours prior to the first CHG shower.    Please follow these instructions carefully:  1.  Shower with CHG Soap the night before surgery and the  morning of Surgery.  2.  If you choose to wash your hair, wash your hair first as usual with your  normal  shampoo.  3.  After you shampoo, rinse your hair and body thoroughly to remove the  shampoo.                                        4.  Use CHG as you would any other liquid soap.  You can apply chg directly  to the skin and wash                       Gently with a scrungie or clean washcloth.  5.  Apply the CHG Soap to your body ONLY FROM THE NECK DOWN.   Do not use on face/ open  Wound or open sores. Avoid contact with eyes, ears mouth and genitals (private parts).                       Wash face,  Genitals (private parts) with your normal soap.             6.  Wash thoroughly, paying special attention to the area where your surgery  will be performed.  7.  Thoroughly rinse your body with warm water from the neck down.  8.  DO NOT shower/wash with your normal soap after using and rinsing off  the CHG Soap.             9.  Pat yourself dry with a clean towel.            10.  Wear clean pajamas.            11.  Place clean sheets on your bed the night of your first shower and do not  sleep with pets. Day of Surgery : Do not apply any lotions/deodorants the morning of surgery.  Please wear clean clothes to the hospital/surgery center.  FAILURE TO FOLLOW THESE INSTRUCTIONS MAY RESULT IN THE CANCELLATION OF YOUR SURGERY PATIENT SIGNATURE_________________________________  NURSE SIGNATURE__________________________________  ________________________________________________________________________   Adam Phenix  An incentive spirometer is a tool that can help keep  your lungs clear and active. This tool measures how well you are filling your lungs with each breath. Taking long deep breaths may help reverse or decrease the chance of developing breathing (pulmonary) problems (especially infection) following:  A long period of time when you are unable to move or be active. BEFORE THE PROCEDURE   If the spirometer includes an indicator to show your best effort, your nurse or respiratory therapist will set it to a desired goal.  If possible, sit up straight or lean slightly forward. Try not to slouch.  Hold the incentive spirometer in an upright position. INSTRUCTIONS FOR USE  1. Sit on the edge of your bed if possible, or sit up as far as you can in bed or on a chair. 2. Hold the incentive spirometer in an upright position. 3. Breathe out normally. 4. Place the mouthpiece in your mouth and seal your lips tightly around it. 5. Breathe in slowly and as deeply as possible, raising the piston or the ball toward the top of the column. 6. Hold your breath for 3-5 seconds or for as long as possible. Allow the piston or ball to fall to the bottom of the column. 7. Remove the mouthpiece from your mouth and breathe out normally. 8. Rest for a few seconds and repeat Steps 1 through 7 at least 10 times every 1-2 hours when you are awake. Take your time and take a few normal breaths between deep breaths. 9. The spirometer may include an indicator to show your best effort. Use the indicator as a goal to work toward during each repetition. 10. After each set of 10 deep breaths, practice coughing to be sure your lungs are clear. If you have an incision (the cut made at the time of surgery), support your incision when coughing by placing a pillow or rolled up towels firmly against it. Once you are able to get out of bed, walk around indoors and cough well. You may stop using the incentive spirometer when instructed by your caregiver.  RISKS AND COMPLICATIONS  Take your time  so you do not get dizzy or  light-headed.  If you are in pain, you may need to take or ask for pain medication before doing incentive spirometry. It is harder to take a deep breath if you are having pain. AFTER USE  Rest and breathe slowly and easily.  It can be helpful to keep track of a log of your progress. Your caregiver can provide you with a simple table to help with this. If you are using the spirometer at home, follow these instructions: [place] IF:   You are having difficultly using the spirometer.  You have trouble using the spirometer as often as instructed.  Your pain medication is not giving enough relief while using the spirometer.  You develop fever of 100.5 F (38.1 C) or higher. SEEK IMMEDIATE MEDICAL CARE IF:   You cough up bloody sputum that had not been present before.  You develop fever of 102 F (38.9 C) or greater.  You develop worsening pain at or near the incision site. MAKE SURE YOU:   Understand these instructions.  Will watch your condition.  Will get help right away if you are not doing well or get worse. Document Released: 09/14/2006 Document Revised: 07/27/2011 Document Reviewed: 11/15/2006 [facility] Patient Information 2014 [place], [place].   ________________________________________________________________________

## 2019-08-17 ENCOUNTER — Other Ambulatory Visit: Payer: Self-pay

## 2019-08-17 ENCOUNTER — Encounter (HOSPITAL_COMMUNITY)
Admission: RE | Admit: 2019-08-17 | Discharge: 2019-08-17 | Disposition: A | Discharge status: Home / Self Care | Payer: BC Managed Care – PPO | Source: Ambulatory Visit | Attending: Obstetrics and Gynecology | Admitting: Obstetrics and Gynecology

## 2019-08-17 ENCOUNTER — Encounter (HOSPITAL_COMMUNITY): Payer: BC Managed Care – PPO

## 2019-08-17 ENCOUNTER — Encounter (HOSPITAL_COMMUNITY): Payer: Self-pay

## 2019-08-17 DIAGNOSIS — Z01812 Encounter for preprocedural laboratory examination: Secondary | ICD-10-CM | POA: Diagnosis not present

## 2019-08-17 HISTORY — DX: Cardiac arrhythmia, unspecified: I49.9

## 2019-08-17 HISTORY — DX: Dyspnea, unspecified: R06.00

## 2019-08-17 LAB — BASIC METABOLIC PANEL
Anion gap: 10 (ref 5–15)
BUN: 15 mg/dL (ref 6–20)
CO2: 23 mmol/L (ref 22–32)
Calcium: 9.3 mg/dL (ref 8.9–10.3)
Chloride: 104 mmol/L (ref 98–111)
Creatinine, Ser: 0.67 mg/dL (ref 0.44–1.00)
GFR calc Af Amer: >60 mL/min (ref >60)
GFR calc non Af Amer: >60 mL/min (ref >60)
Glucose, Bld: 82 mg/dL (ref 70–99)
Potassium: 4.5 mmol/L (ref 3.5–5.1)
Sodium: 137 mmol/L (ref 135–145)

## 2019-08-17 LAB — CBC
HCT: 47.1 % — ABNORMAL HIGH (ref 36.0–46.0)
Hemoglobin: 15.4 g/dL — ABNORMAL HIGH (ref 12.0–15.0)
MCH: 29.4 pg (ref 26.0–34.0)
MCHC: 32.7 g/dL (ref 30.0–36.0)
MCV: 90.1 fL (ref 80.0–100.0)
Platelets: 302 10*3/uL (ref 150–400)
RBC: 5.23 MIL/uL — ABNORMAL HIGH (ref 3.87–5.11)
RDW: 12.3 % (ref 11.5–15.5)
WBC: 6 10*3/uL (ref 4.0–10.5)
nRBC: 0 % (ref 0.0–0.2)

## 2019-08-17 LAB — ABO/RH: ABO/RH(D): O POS

## 2019-08-17 NOTE — Progress Notes (Signed)
PCP - Dr. Raliegh Ip. [place] Cardiologist - Dr. Fransico Him  Chest x-ray - AAA [place] for dilated aortic root EKG - 07/25/19 Stress Test - 2018 ECHO - 11/21/18 Cardiac Cath - no  Sleep Study - yes CPAP -yes   Fasting Blood Sugar - NA Checks Blood Sugar _____ times a day  Blood Thinner Instructions:NA Aspirin Instructions: Last Dose:  Anesthesia review:   Patient denies shortness of breath, fever, cough and chest pain at PAT appointment  yes Patient verbalized understanding of instructions that were given to them at the PAT appointment. Patient was also instructed that they will need to review over the PAT instructions again at home before surgery. yes

## 2019-08-18 ENCOUNTER — Other Ambulatory Visit (HOSPITAL_COMMUNITY)
Admission: RE | Admit: 2019-08-18 | Discharge: 2019-08-18 | Disposition: A | Discharge status: Home / Self Care | Payer: BC Managed Care – PPO | Source: Ambulatory Visit | Attending: Obstetrics and Gynecology | Admitting: Obstetrics and Gynecology

## 2019-08-18 DIAGNOSIS — Z20822 Contact with and (suspected) exposure to covid-19: Secondary | ICD-10-CM | POA: Diagnosis not present

## 2019-08-18 DIAGNOSIS — Z01812 Encounter for preprocedural laboratory examination: Secondary | ICD-10-CM | POA: Insufficient documentation

## 2019-08-18 LAB — SARS CORONAVIRUS 2 (TAT 6-24 HRS): SARS Coronavirus 2: NEGATIVE

## 2019-08-21 NOTE — Progress Notes (Signed)
Spoke with beverly harrelson rn, covid test done 08-18-2019, should be done 08-21-2019, beverly harrelson, rn said to check with anesthesia and if needs to be redone sent patient early on day before surgery 08-23-2019.

## 2019-08-21 NOTE — Progress Notes (Signed)
Spoke with patient by phone and made aware she needs covid test redone per anesthesia guidelines, covid test appointment made for 08-23-2019 at 900 am, per bevernly harrelson rn patient is to be retested day before surgery.

## 2019-08-22 ENCOUNTER — Other Ambulatory Visit: Payer: Self-pay | Admitting: Family Medicine

## 2019-08-23 ENCOUNTER — Encounter (HOSPITAL_BASED_OUTPATIENT_CLINIC_OR_DEPARTMENT_OTHER): Payer: Self-pay | Admitting: Obstetrics and Gynecology

## 2019-08-23 ENCOUNTER — Other Ambulatory Visit (HOSPITAL_COMMUNITY)
Admission: RE | Admit: 2019-08-23 | Discharge: 2019-08-23 | Disposition: A | Discharge status: Home / Self Care | Payer: BC Managed Care – PPO | Source: Ambulatory Visit | Attending: Obstetrics and Gynecology | Admitting: Obstetrics and Gynecology

## 2019-08-23 DIAGNOSIS — Z01812 Encounter for preprocedural laboratory examination: Secondary | ICD-10-CM | POA: Insufficient documentation

## 2019-08-23 DIAGNOSIS — Z20822 Contact with and (suspected) exposure to covid-19: Secondary | ICD-10-CM | POA: Insufficient documentation

## 2019-08-23 LAB — SARS CORONAVIRUS 2 (TAT 6-24 HRS): SARS Coronavirus 2: NEGATIVE

## 2019-08-23 NOTE — Progress Notes (Signed)
Received call from pt via phone.  She was inquiring about pre-surgery ensure drink that she was instructions to drink morning of surgery since it had 50 mg carbohydrates and she is pre-diabetic.  Advised pt not to drink ensure drink am of surgery but to have as much clear liquids from midnight until 0430 morning of surgery (went over what liquids are allowed) then nothing my mouth, pt verbalized understanding.

## 2019-08-24 ENCOUNTER — Encounter (HOSPITAL_BASED_OUTPATIENT_CLINIC_OR_DEPARTMENT_OTHER): Payer: Self-pay | Admitting: Obstetrics and Gynecology

## 2019-08-24 ENCOUNTER — Other Ambulatory Visit: Payer: Self-pay

## 2019-08-24 ENCOUNTER — Ambulatory Visit (HOSPITAL_BASED_OUTPATIENT_CLINIC_OR_DEPARTMENT_OTHER): Payer: BC Managed Care – PPO | Admitting: Anesthesiology

## 2019-08-24 ENCOUNTER — Ambulatory Visit (HOSPITAL_BASED_OUTPATIENT_CLINIC_OR_DEPARTMENT_OTHER)
Admission: RE | Admit: 2019-08-24 | Discharge: 2019-08-24 | Disposition: A | Discharge status: Home / Self Care | Payer: BC Managed Care – PPO | Attending: Obstetrics and Gynecology | Admitting: Obstetrics and Gynecology

## 2019-08-24 ENCOUNTER — Ambulatory Visit (HOSPITAL_BASED_OUTPATIENT_CLINIC_OR_DEPARTMENT_OTHER): Payer: BC Managed Care – PPO | Admitting: Physician Assistant

## 2019-08-24 ENCOUNTER — Encounter (HOSPITAL_BASED_OUTPATIENT_CLINIC_OR_DEPARTMENT_OTHER)
Admission: RE | Disposition: A | Discharge status: Home / Self Care | Payer: Self-pay | Source: Home / Self Care | Attending: Obstetrics and Gynecology

## 2019-08-24 DIAGNOSIS — Z800 Family history of malignant neoplasm of digestive organs: Secondary | ICD-10-CM | POA: Insufficient documentation

## 2019-08-24 DIAGNOSIS — E039 Hypothyroidism, unspecified: Secondary | ICD-10-CM | POA: Insufficient documentation

## 2019-08-24 DIAGNOSIS — R002 Palpitations: Secondary | ICD-10-CM | POA: Insufficient documentation

## 2019-08-24 DIAGNOSIS — Z86010 Personal history of colonic polyps: Secondary | ICD-10-CM | POA: Insufficient documentation

## 2019-08-24 DIAGNOSIS — D251 Intramural leiomyoma of uterus: Secondary | ICD-10-CM | POA: Insufficient documentation

## 2019-08-24 DIAGNOSIS — Z791 Long term (current) use of non-steroidal anti-inflammatories (NSAID): Secondary | ICD-10-CM | POA: Insufficient documentation

## 2019-08-24 DIAGNOSIS — G4733 Obstructive sleep apnea (adult) (pediatric): Secondary | ICD-10-CM | POA: Insufficient documentation

## 2019-08-24 DIAGNOSIS — R102 Pelvic and perineal pain: Secondary | ICD-10-CM | POA: Diagnosis not present

## 2019-08-24 DIAGNOSIS — Z8632 Personal history of gestational diabetes: Secondary | ICD-10-CM | POA: Insufficient documentation

## 2019-08-24 DIAGNOSIS — D259 Leiomyoma of uterus, unspecified: Secondary | ICD-10-CM

## 2019-08-24 DIAGNOSIS — Z79899 Other long term (current) drug therapy: Secondary | ICD-10-CM | POA: Insufficient documentation

## 2019-08-24 DIAGNOSIS — Z880 Allergy status to penicillin: Secondary | ICD-10-CM | POA: Insufficient documentation

## 2019-08-24 DIAGNOSIS — Z8041 Family history of malignant neoplasm of ovary: Secondary | ICD-10-CM | POA: Insufficient documentation

## 2019-08-24 DIAGNOSIS — N838 Other noninflammatory disorders of ovary, fallopian tube and broad ligament: Secondary | ICD-10-CM | POA: Diagnosis not present

## 2019-08-24 DIAGNOSIS — K760 Fatty (change of) liver, not elsewhere classified: Secondary | ICD-10-CM | POA: Insufficient documentation

## 2019-08-24 DIAGNOSIS — Z833 Family history of diabetes mellitus: Secondary | ICD-10-CM | POA: Diagnosis not present

## 2019-08-24 DIAGNOSIS — N939 Abnormal uterine and vaginal bleeding, unspecified: Secondary | ICD-10-CM | POA: Diagnosis present

## 2019-08-24 DIAGNOSIS — E559 Vitamin D deficiency, unspecified: Secondary | ICD-10-CM | POA: Diagnosis not present

## 2019-08-24 HISTORY — DX: Personal history of gestational diabetes: Z86.32

## 2019-08-24 HISTORY — DX: Prediabetes: R73.03

## 2019-08-24 HISTORY — PX: ROBOTIC ASSISTED LAPAROSCOPIC HYSTERECTOMY AND SALPINGECTOMY: SHX6379

## 2019-08-24 LAB — TYPE AND SCREEN
ABO/RH(D): O POS
Antibody Screen: NEGATIVE

## 2019-08-24 LAB — GLUCOSE, CAPILLARY: Glucose-Capillary: 88 mg/dL (ref 70–99)

## 2019-08-24 LAB — POCT PREGNANCY, URINE: Preg Test, Ur: NEGATIVE

## 2019-08-24 SURGERY — XI ROBOTIC ASSISTED LAPAROSCOPIC HYSTERECTOMY AND SALPINGECTOMY
Anesthesia: General | Site: Abdomen | Laterality: Bilateral

## 2019-08-24 MED ORDER — EPHEDRINE SULFATE 50 MG/ML IJ SOLN
INTRAMUSCULAR | Status: DC | PRN
  Administered 2019-08-24: 15 mg via INTRAVENOUS
  Administered 2019-08-24: 10 mg via INTRAVENOUS

## 2019-08-24 MED ORDER — LACTATED RINGERS IV SOLN
INTRAVENOUS | Status: DC
  Filled 2019-08-24: qty 1000

## 2019-08-24 MED ORDER — DOCUSATE SODIUM 100 MG PO CAPS
ORAL_CAPSULE | ORAL | Status: AC
  Filled 2019-08-24: qty 1

## 2019-08-24 MED ORDER — SODIUM CHLORIDE (PF) 0.9 % IJ SOLN
INTRAMUSCULAR | Status: AC
  Filled 2019-08-24: qty 10

## 2019-08-24 MED ORDER — DOCUSATE SODIUM 100 MG PO CAPS
100.0000 mg | ORAL_CAPSULE | Freq: Two times a day (BID) | ORAL | Status: DC
  Administered 2019-08-24: 100 mg via ORAL
  Filled 2019-08-24: qty 1

## 2019-08-24 MED ORDER — ACETAMINOPHEN 500 MG PO TABS
ORAL_TABLET | ORAL | Status: AC
  Filled 2019-08-24: qty 2

## 2019-08-24 MED ORDER — DEXAMETHASONE SODIUM PHOSPHATE 4 MG/ML IJ SOLN
4.0000 mg | INTRAMUSCULAR | Status: DC
  Filled 2019-08-24: qty 1

## 2019-08-24 MED ORDER — GABAPENTIN 300 MG PO CAPS
ORAL_CAPSULE | ORAL | Status: AC
  Filled 2019-08-24: qty 1

## 2019-08-24 MED ORDER — CLINDAMYCIN PHOSPHATE 900 MG/50ML IV SOLN
INTRAVENOUS | Status: AC
  Filled 2019-08-24: qty 50

## 2019-08-24 MED ORDER — LIDOCAINE 2% (20 MG/ML) 5 ML SYRINGE
INTRAMUSCULAR | Status: AC
  Filled 2019-08-24: qty 5

## 2019-08-24 MED ORDER — EPHEDRINE 5 MG/ML INJ
INTRAVENOUS | Status: AC
  Filled 2019-08-24: qty 10

## 2019-08-24 MED ORDER — OXYCODONE HCL 5 MG PO TABS
5.0000 mg | ORAL_TABLET | ORAL | Status: DC | PRN
  Administered 2019-08-24: 15:00:00 5 mg via ORAL
  Filled 2019-08-24: qty 1

## 2019-08-24 MED ORDER — CELECOXIB 200 MG PO CAPS
ORAL_CAPSULE | ORAL | Status: AC
  Filled 2019-08-24: qty 2

## 2019-08-24 MED ORDER — ONDANSETRON HCL 4 MG/2ML IJ SOLN
INTRAMUSCULAR | Status: DC | PRN
  Administered 2019-08-24: 4 mg via INTRAVENOUS

## 2019-08-24 MED ORDER — KETOROLAC TROMETHAMINE 30 MG/ML IJ SOLN: INTRAMUSCULAR | Status: DC | PRN

## 2019-08-24 MED ORDER — SUGAMMADEX SODIUM 200 MG/2ML IV SOLN
INTRAVENOUS | Status: DC | PRN
  Administered 2019-08-24: 200 mg via INTRAVENOUS

## 2019-08-24 MED ORDER — CIPROFLOXACIN IN D5W 400 MG/200ML IV SOLN
INTRAVENOUS | Status: AC
  Filled 2019-08-24: qty 200

## 2019-08-24 MED ORDER — ENSURE PRE-SURGERY PO LIQD
296.0000 mL | Freq: Once | ORAL | Status: DC
  Filled 2019-08-24: qty 296

## 2019-08-24 MED ORDER — ACETAMINOPHEN 500 MG PO TABS
1000.0000 mg | ORAL_TABLET | ORAL | Status: AC
  Administered 2019-08-24: 06:00:00 1000 mg via ORAL
  Filled 2019-08-24: qty 2

## 2019-08-24 MED ORDER — CIPROFLOXACIN IN D5W 400 MG/200ML IV SOLN
400.0000 mg | INTRAVENOUS | Status: AC
  Administered 2019-08-24: 400 mg via INTRAVENOUS
  Filled 2019-08-24: qty 200

## 2019-08-24 MED ORDER — ACETAMINOPHEN 500 MG PO TABS
ORAL_TABLET | ORAL | Status: AC
  Filled 2019-08-24: qty 1

## 2019-08-24 MED ORDER — MENTHOL 3 MG MT LOZG
LOZENGE | OROMUCOSAL | Status: AC
  Filled 2019-08-24: qty 9

## 2019-08-24 MED ORDER — DEXAMETHASONE SODIUM PHOSPHATE 4 MG/ML IJ SOLN
INTRAMUSCULAR | Status: DC | PRN
  Administered 2019-08-24: 10 mg via INTRAVENOUS

## 2019-08-24 MED ORDER — LIDOCAINE HCL (CARDIAC) PF 100 MG/5ML IV SOSY
PREFILLED_SYRINGE | INTRAVENOUS | Status: DC | PRN
  Administered 2019-08-24: 100 mg via INTRAVENOUS

## 2019-08-24 MED ORDER — ACETAMINOPHEN 500 MG PO TABS
500.0000 mg | ORAL_TABLET | Freq: Four times a day (QID) | ORAL | Status: DC
  Administered 2019-08-24: 13:00:00 500 mg via ORAL
  Filled 2019-08-24: qty 1

## 2019-08-24 MED ORDER — ONDANSETRON HCL 4 MG PO TABS
4.0000 mg | ORAL_TABLET | Freq: Four times a day (QID) | ORAL | Status: DC | PRN
  Filled 2019-08-24: qty 1

## 2019-08-24 MED ORDER — SCOPOLAMINE 1 MG/3DAYS TD PT72
MEDICATED_PATCH | TRANSDERMAL | Status: AC
  Filled 2019-08-24: qty 1

## 2019-08-24 MED ORDER — PROPOFOL 10 MG/ML IV BOLUS
INTRAVENOUS | Status: DC | PRN
  Administered 2019-08-24: 100 mg via INTRAVENOUS

## 2019-08-24 MED ORDER — FENTANYL CITRATE (PF) 100 MCG/2ML IJ SOLN
INTRAMUSCULAR | Status: AC
  Filled 2019-08-24: qty 2

## 2019-08-24 MED ORDER — CELECOXIB 400 MG PO CAPS
400.0000 mg | ORAL_CAPSULE | ORAL | Status: AC
  Administered 2019-08-24: 06:00:00 400 mg via ORAL
  Filled 2019-08-24: qty 1

## 2019-08-24 MED ORDER — GABAPENTIN 300 MG PO CAPS
300.0000 mg | ORAL_CAPSULE | ORAL | Status: AC
  Administered 2019-08-24: 300 mg via ORAL
  Filled 2019-08-24: qty 1

## 2019-08-24 MED ORDER — SODIUM CHLORIDE 0.9 % IV SOLN
INTRAVENOUS | Status: DC | PRN
  Administered 2019-08-24: 10 mL
  Administered 2019-08-24: 110 mL

## 2019-08-24 MED ORDER — ROCURONIUM BROMIDE 10 MG/ML (PF) SYRINGE
PREFILLED_SYRINGE | INTRAVENOUS | Status: AC
  Filled 2019-08-24: qty 10

## 2019-08-24 MED ORDER — FENTANYL CITRATE (PF) 250 MCG/5ML IJ SOLN
INTRAMUSCULAR | Status: AC
  Filled 2019-08-24: qty 5

## 2019-08-24 MED ORDER — ROCURONIUM BROMIDE 100 MG/10ML IV SOLN
INTRAVENOUS | Status: DC | PRN
  Administered 2019-08-24: 10 mg via INTRAVENOUS
  Administered 2019-08-24: 70 mg via INTRAVENOUS
  Administered 2019-08-24 (×2): 20 mg via INTRAVENOUS

## 2019-08-24 MED ORDER — DEXAMETHASONE SODIUM PHOSPHATE 10 MG/ML IJ SOLN
INTRAMUSCULAR | Status: AC
  Filled 2019-08-24: qty 1

## 2019-08-24 MED ORDER — CLINDAMYCIN PHOSPHATE 900 MG/50ML IV SOLN
900.0000 mg | INTRAVENOUS | Status: AC
  Administered 2019-08-24: 900 mg via INTRAVENOUS
  Filled 2019-08-24: qty 50

## 2019-08-24 MED ORDER — MIDAZOLAM HCL 2 MG/2ML IJ SOLN
INTRAMUSCULAR | Status: AC
  Filled 2019-08-24: qty 2

## 2019-08-24 MED ORDER — PROPOFOL 10 MG/ML IV BOLUS
INTRAVENOUS | Status: AC
  Filled 2019-08-24: qty 20

## 2019-08-24 MED ORDER — FENTANYL CITRATE (PF) 100 MCG/2ML IJ SOLN
INTRAMUSCULAR | Status: DC | PRN
  Administered 2019-08-24 (×7): 50 ug via INTRAVENOUS

## 2019-08-24 MED ORDER — OXYCODONE HCL 5 MG PO TABS
ORAL_TABLET | ORAL | Status: AC
  Filled 2019-08-24: qty 1

## 2019-08-24 MED ORDER — SIMETHICONE 80 MG PO CHEW
80.0000 mg | CHEWABLE_TABLET | Freq: Four times a day (QID) | ORAL | Status: DC | PRN
  Filled 2019-08-24: qty 1

## 2019-08-24 MED ORDER — IBUPROFEN 600 MG PO TABS: ORAL_TABLET | ORAL | 1 refills | Status: DC

## 2019-08-24 MED ORDER — ONDANSETRON HCL 4 MG/2ML IJ SOLN
4.0000 mg | Freq: Four times a day (QID) | INTRAMUSCULAR | Status: DC | PRN
  Filled 2019-08-24: qty 2

## 2019-08-24 MED ORDER — MIDAZOLAM HCL 5 MG/5ML IJ SOLN
INTRAMUSCULAR | Status: DC | PRN
  Administered 2019-08-24: 2 mg via INTRAVENOUS

## 2019-08-24 MED ORDER — OXYCODONE HCL 5 MG PO TABS: ORAL_TABLET | ORAL | 0 refills | Status: DC

## 2019-08-24 MED ORDER — KETOROLAC TROMETHAMINE 30 MG/ML IJ SOLN
INTRAMUSCULAR | Status: AC
  Filled 2019-08-24: qty 1

## 2019-08-24 MED ORDER — ONDANSETRON HCL 4 MG/2ML IJ SOLN
INTRAMUSCULAR | Status: AC
  Filled 2019-08-24: qty 2

## 2019-08-24 MED ORDER — KETOROLAC TROMETHAMINE 30 MG/ML IJ SOLN
30.0000 mg | Freq: Four times a day (QID) | INTRAMUSCULAR | Status: DC
  Administered 2019-08-24: 30 mg via INTRAVENOUS
  Filled 2019-08-24: qty 1

## 2019-08-24 MED ORDER — SODIUM CHLORIDE 0.9 % IR SOLN
Status: DC | PRN
  Administered 2019-08-24: 3000 mL

## 2019-08-24 MED ORDER — MENTHOL 3 MG MT LOZG
1.0000 | LOZENGE | OROMUCOSAL | Status: DC | PRN
  Filled 2019-08-24: qty 9

## 2019-08-24 MED ORDER — SCOPOLAMINE 1 MG/3DAYS TD PT72
1.0000 | MEDICATED_PATCH | TRANSDERMAL | Status: DC
  Administered 2019-08-24: 06:00:00 1.5 mg via TRANSDERMAL
  Filled 2019-08-24: qty 1

## 2019-08-24 SURGICAL SUPPLY — 65 items
BARRIER ADHS 3X4 INTERCEED (GAUZE/BANDAGES/DRESSINGS) ×2 IMPLANT
CNTNR URN SCR LID CUP LEK RST (MISCELLANEOUS) ×1 IMPLANT
CONT SPEC 4OZ STRL OR WHT (MISCELLANEOUS) ×2
COVER BACK TABLE 60X90IN (DRAPES) ×2 IMPLANT
COVER TIP SHEARS 8 DVNC (MISCELLANEOUS) ×1 IMPLANT
COVER TIP SHEARS 8MM DA VINCI (MISCELLANEOUS) ×2
COVER WAND RF STERILE (DRAPES) ×2 IMPLANT
DECANTER SPIKE VIAL GLASS SM (MISCELLANEOUS) ×4 IMPLANT
DEFOGGER SCOPE WARMER CLEARIFY (MISCELLANEOUS) ×2 IMPLANT
DERMABOND ADVANCED (GAUZE/BANDAGES/DRESSINGS) ×1
DERMABOND ADVANCED .7 DNX12 (GAUZE/BANDAGES/DRESSINGS) ×1 IMPLANT
DRAPE ARM DVNC X/XI (DISPOSABLE) ×4 IMPLANT
DRAPE COLUMN DVNC XI (DISPOSABLE) ×1 IMPLANT
DRAPE DA VINCI XI ARM (DISPOSABLE) ×8
DRAPE DA VINCI XI COLUMN (DISPOSABLE) ×2
DURAPREP 26ML APPLICATOR (WOUND CARE) ×2 IMPLANT
ELECT REM PT RETURN 9FT ADLT (ELECTROSURGICAL) ×2
ELECTRODE REM PT RTRN 9FT ADLT (ELECTROSURGICAL) ×1 IMPLANT
GAUZE 4X4 16PLY RFD (DISPOSABLE) ×2 IMPLANT
GLOVE BIO SURGEON STRL SZ7 (GLOVE) ×6 IMPLANT
GLOVE BIOGEL PI IND STRL 7.0 (GLOVE) ×7 IMPLANT
GLOVE BIOGEL PI IND STRL 7.5 (GLOVE) ×2 IMPLANT
GLOVE BIOGEL PI INDICATOR 7.0 (GLOVE) ×7
GLOVE BIOGEL PI INDICATOR 7.5 (GLOVE) ×2
GLOVE ECLIPSE 6.5 STRL STRAW (GLOVE) ×8 IMPLANT
GLOVE SURG SS PI 8.5 STRL IVOR (GLOVE) ×1
GLOVE SURG SS PI 8.5 STRL STRW (GLOVE) ×1 IMPLANT
GOWN STRL REUS W/ TWL XL LVL3 (GOWN DISPOSABLE) ×1 IMPLANT
GOWN STRL REUS W/TWL XL LVL3 (GOWN DISPOSABLE) ×2
IRRIG SUCT STRYKERFLOW 2 WTIP (MISCELLANEOUS) ×2
IRRIGATION SUCT STRKRFLW 2 WTP (MISCELLANEOUS) ×1 IMPLANT
IV NS 1000ML (IV SOLUTION) ×2
IV NS 1000ML BAXH (IV SOLUTION) ×1 IMPLANT
IV SET EXTENSION GRAVITY 40 LF (IV SETS) ×2 IMPLANT
LEGGING LITHOTOMY PAIR STRL (DRAPES) ×2 IMPLANT
MANIFOLD NEPTUNE II (INSTRUMENTS) ×2 IMPLANT
NEEDLE HYPO 22GX1.5 SAFETY (NEEDLE) ×2 IMPLANT
NS IRRIG 500ML POUR BTL (IV SOLUTION) ×2 IMPLANT
OBTURATOR OPTICAL STANDARD 8MM (TROCAR) ×2
OBTURATOR OPTICAL STND 8 DVNC (TROCAR) ×1
OBTURATOR OPTICALSTD 8 DVNC (TROCAR) ×1 IMPLANT
OCCLUDER COLPOPNEUMO (BALLOONS) ×2 IMPLANT
PACK ROBOT WH (CUSTOM PROCEDURE TRAY) ×2 IMPLANT
PACK ROBOTIC GOWN (GOWN DISPOSABLE) ×2 IMPLANT
PACK TRENDGUARD 450 HYBRID PRO (MISCELLANEOUS) ×1 IMPLANT
PAD PREP 24X48 CUFFED NSTRL (MISCELLANEOUS) ×2 IMPLANT
POUCH LAPAROSCOPIC INSTRUMENT (MISCELLANEOUS) ×2 IMPLANT
PROTECTOR NERVE ULNAR (MISCELLANEOUS) ×6 IMPLANT
SEAL CANN UNIV 5-8 DVNC XI (MISCELLANEOUS) ×4 IMPLANT
SEAL XI 5MM-8MM UNIVERSAL (MISCELLANEOUS) ×8
SEALER VESSEL DA VINCI XI (MISCELLANEOUS) ×2
SEALER VESSEL EXT DVNC XI (MISCELLANEOUS) ×1 IMPLANT
SET TRI-LUMEN FLTR TB AIRSEAL (TUBING) ×2 IMPLANT
STRIP CLOSURE SKIN 1/4X4 (GAUZE/BANDAGES/DRESSINGS) ×2 IMPLANT
SUT MNCRL AB 3-0 PS2 27 (SUTURE) ×4 IMPLANT
SUT VIC AB 0 CT1 27 (SUTURE) ×4
SUT VIC AB 0 CT1 27XBRD ANBCTR (SUTURE) ×2 IMPLANT
SUT VICRYL 0 UR6 27IN ABS (SUTURE) ×2 IMPLANT
SUT VLOC 180 0 9IN  GS21 (SUTURE) ×4
SUT VLOC 180 0 9IN GS21 (SUTURE) ×2 IMPLANT
TIP UTERINE 6.7X10CM GRN DISP (MISCELLANEOUS) ×2 IMPLANT
TOWEL OR 17X26 10 PK STRL BLUE (TOWEL DISPOSABLE) ×2 IMPLANT
TRAY FOLEY W/BAG SLVR 14FR (SET/KITS/TRAYS/PACK) ×2 IMPLANT
TRENDGUARD 450 HYBRID PRO PACK (MISCELLANEOUS) ×2
TROCAR PORT AIRSEAL 8X120 (TROCAR) ×2 IMPLANT

## 2019-08-24 NOTE — Anesthesia Preprocedure Evaluation (Signed)
Anesthesia Evaluation  Patient identified by MRN, date of birth, ID band Patient awake    Reviewed: Allergy & Precautions, NPO status , Patient's Chart, lab work & pertinent test results  Airway Mallampati: I  TM Distance: >3 FB Neck ROM: Full    Dental   Pulmonary sleep apnea ,    Pulmonary exam normal        Cardiovascular Normal cardiovascular exam     Neuro/Psych Depression    GI/Hepatic   Endo/Other    Renal/GU      Musculoskeletal   Abdominal   Peds  Hematology   Anesthesia Other Findings   Reproductive/Obstetrics                             Anesthesia Physical Anesthesia Plan  ASA: III  Anesthesia Plan: General   Post-op Pain Management:    Induction: Intravenous  PONV Risk Score and Plan: 3 and Ondansetron, Dexamethasone and Midazolam  Airway Management Planned: Oral ETT  Additional Equipment:   Intra-op Plan:   Post-operative Plan: Extubation in OR  Informed Consent: I have reviewed the patients History and Physical, chart, labs and discussed the procedure including the risks, benefits and alternatives for the proposed anesthesia with the patient or authorized representative who has indicated his/her understanding and acceptance.       Plan Discussed with: CRNA and Surgeon  Anesthesia Plan Comments:         Anesthesia Quick Evaluation

## 2019-08-24 NOTE — Discharge Instructions (Signed)
Call [facility] @ [phone] if:  You have a temperature greater than or equal to 100.4 degrees Farenheit orally You have pain that is not made better by the pain medication given and taken as directed You have excessive bleeding or problems urinating  Take Colace (Docusate Sodium/Stool Softener) 100 mg 2-3 times daily while taking narcotic pain medicine to avoid constipation or until bowel movements are regular. Take Ibuprofen 600 mg with food and Tylenol  (Acetaminophen) 500 mg  #2 tablets every 6 hours for 5 days then as needed for pain  You may drive after 1 week You may walk up steps  You may shower tomorrow You may resume a regular diet  Keep incisions clean and dry Do not lift over 15 pounds for 6 weeks Avoid anything in vagina for 6 weeks (or until after your post-operative visit)

## 2019-08-24 NOTE — Transfer of Care (Signed)
Immediate Anesthesia Transfer of Care Note  Patient: Ann Bass  Procedure(s) Performed: XI ROBOTIC ASSISTED LAPAROSCOPIC HYSTERECTOMY AND SALPINGECTOMY (Bilateral Abdomen)  Patient Location: PACU  Anesthesia Type:General  Level of Consciousness: awake, alert , oriented and patient cooperative  Airway & Oxygen Therapy: Patient Spontanous Breathing and Patient connected to nasal cannula oxygen  Post-op Assessment: Report given to RN and Post -op Vital signs reviewed and stable  Post vital signs: Reviewed and stable  Last Vitals:  Vitals Value Taken Time  BP    Temp    Pulse    Resp 19 08/24/19 1121  SpO2    Vitals shown include unvalidated device data.  Last Pain:  Vitals:   08/24/19 0630  TempSrc: Oral  PainSc: 0-No pain      Patients Stated Pain Goal: 5 ([id number])  Complications: No apparent anesthesia complications

## 2019-08-24 NOTE — Anesthesia Procedure Notes (Signed)
Procedure Name: Intubation Date/Time: 08/24/2019 7:42 AM Performed by: Justice Rocher, CRNA Pre-anesthesia Checklist: Patient identified, Emergency Drugs available, Suction available, Patient being monitored and Timeout performed Patient Re-evaluated:Patient Re-evaluated prior to induction Oxygen Delivery Method: Circle system utilized Preoxygenation: Pre-oxygenation with 100% oxygen Induction Type: IV induction Ventilation: Mask ventilation without difficulty Laryngoscope Size: Mac and 3 Grade View: Grade II Tube type: Oral Tube size: 7.0 mm Number of attempts: 1 Airway Equipment and Method: Stylet and Oral airway Placement Confirmation: ETT inserted through vocal cords under direct vision,  positive ETCO2,  breath sounds checked- equal and bilateral and CO2 detector Secured at: 21 cm Tube secured with: Tape Dental Injury: Teeth and Oropharynx as per pre-operative assessment

## 2019-08-24 NOTE — Progress Notes (Signed)
Dr. Cletis Media called and given update on patients status, pt has met all criteria and will be discharged home with husband as primary caregiver

## 2019-08-24 NOTE — Interval H&P Note (Signed)
History and Physical Interval Note:  08/24/2019 7:29 AM  Ann Bass  has presented today for surgery, with the diagnosis of Uterine Fibroids.  The various methods of treatment have been discussed with the patient and family. After consideration of risks, benefits and other options for treatment, the patient has consented to  Procedure(s): XI ROBOTIC ASSISTED LAPAROSCOPIC HYSTERECTOMY AND SALPINGECTOMY (Bilateral) as a surgical intervention.  The patient's history has been reviewed, patient examined, no change in status, stable for surgery.  I have reviewed the patient's chart and labs.  Questions were answered to the patient's satisfaction.     Ann Bass

## 2019-08-24 NOTE — Op Note (Signed)
Preoperative diagnosis: Uterine fibroids  Postoperative diagnosis: Same   Anesthesia: General  Anesthesiologist: Dr. Conrad Fullerton  Procedure: Robotically assisted total hysterectomy with bilateral salpingectomy  Surgeon: Dr. Katharine Look Juanell Saffo  Assistant: Earnstine Regal P.A.-C.  Estimated blood loss: 100 cc  Procedure:  After being informed of the planned procedure with possible complications including but not limited to bleeding, infection, injury to other organs, need for laparotomy, possible need for morcellation with risks and benefits reviewed, expected hospital stay and recovery, informed consent is obtained and patient is taken to or #6. She is placed in  lithotomy position on [place] with both arms padded and tucked on each side and bilateral knee-high sequential compressive devices. She is given general anesthesia with endotracheal intubation without any complication. She is prepped and draped in a sterile fashion. A three-way Foley catheter is inserted in her bladder.  Pelvic exam reveals: 14 week size uterus, irregular but mobile  A weighted speculum is inserted in the vagina and the anterior lip of the cervix is grasped with a tenaculum forcep. We proceed with a paracervical block and vaginal infiltration using ropivacaine 0.5% diluted 1 in 1 with saline. The uterus was then sounded at 12 cm. We easily dilate the cervix using Hegar dilator to  #27 which allows for easy placement of the intrauterine RUMI manipulator with a 4.0 KOH ring and a vaginal occluder. The ring is sutured to the cervix with 0 Vicryl.  Trocar placement is decided. We infiltrate 3 cm above the umbilicus with 10 cc of ropivacaine per protocol and perform a 10 mm vertical incision which is brought down bluntly to the fascia. The fascia is identified and grasped with Coker forceps. The fascia is incised with Mayo scissors. Peritoneum is entered bluntly. A pursestring suture of 0 Vicryl is placed on the fascia and a 10 mm  Hassan trocar is easily inserted in the abdominal cavity held in placed with a Purstring suture. This allows for easy insufflation of a pneumoperitoneum using warmed CO2 at a maximum pressure of 15 mm of mercury. 60 cc of Ropivacaine 0.5 % diluted 1 in 1 is sent in the pelvis and the patient is positioned in reverse Trendelenburg. We then placed two 89mm robotic trocar on the left, one 59mm robotic trocar on the right and one 8 mm patient's side assistant trocar on the right  after infiltrating every site  with ropivacaine per protocol. The robot is docked on the right of the patient after positioning her in Trendelenburg. A Vessel Seal is inserted in arm #4, a Long bipolar forceps is inserted in arm #2 and a Prograsp is inserted in arm #1.  Preparation and docking is completed in 43 minutes.  Observation: Uterus is enlarged with multiple fibroids, ovaries and tubes are normal, liver is normal, gallbladder is normal, appendix is normal  We start on the right side by sealing and cutting the mesosalpynx , the right utero-ovarian ligament and the right round ligament .  This gives [place] entry into the retroperitoneal space with an easy dissection of the anterior broad ligament. The ligament was opened all the way to the left round ligament.   We then proceed with systematic dissection of the bladder from the anterior vaginal cuff which is easily identified with the KOH ring. The plane of dissection is confirmed with filling the bladder with 200 cc of saline. We are able to dissect the bladder 2 cm below the KOH ring. We then opened the posterior right broad ligament all the way  to the posterior KOH ring after identifying the full course of the right ureter.   Moving to the left side we Seal and cut  the left round ligament , the left utero-ovarian ligament and  the mesosalpinx in between. Entry into the retroperitoneal space allows [place] to complete dissection of the bladder on the left side and skeletonized the  uterine vessels. The left broad ligament is then dissected all the way to the posterior KOH ring after identifying the full course of the left ureter.  Both tubes are removed from the pelvic cavity through the assistant's trocar.   With pressure on the KOH ring and the bladder fully dissected, we cauterize and cut the uterine vessels on both sides at the level of the KOH ring.  The vaginal occluder is inflated and we proceed with a 360 colpotomy using an open monopolar scissors and freeing the uterus entirely.  The uterus is delivered vaginally with vaginal morcellation which takes 7 minutes . The vaginal occluder is reinserted in the vagina to maintain pneumoperitoneum.  Instruments are then modified for a suture cut in arm #4 and a long tip forcep in arm #2. We proceed with closure of the vaginal cuff with 2 running sutures of 0 V-Lock. We irrigated profusely with warm saline and confirm a satisfactory hemostasis as well as 2 normal ureters with good mobility and no dilatation.  A sheet of Interceed , divided in 2, is placed on the vaginal cuff and behind the ovaries.  All instruments are then removed and the robot is undocked. Console time: 1 hour and 55 minutes.  All trochars are removed under direct visualization after evacuating the pneumoperitoneum.  The fascia of the supraumbilical incision is closed with the previously placed pursestring suture of 0 Vicryl. All incisions are then closed with subcuticular suture of 3-0 Monocryl and Dermabond.  A speculum is inserted in the vagina to confirm a adequate closure of the vaginal cuff and good hemostasis.  Instrument and sponge count is complete x2. The procedure is well tolerated by the patient is taken to recovery room in a well and stable condition.  Estimated blood loss: 100  Specimen: Uterus and tubes weighing 470 g sent to pathology

## 2019-08-24 NOTE — Progress Notes (Signed)
Port site to right lateral abdomen oozing serosanguinous, dermabond applied, no further oozing noted.

## 2019-08-24 NOTE — Anesthesia Postprocedure Evaluation (Signed)
Anesthesia Post Note  Patient: Ann Bass  Procedure(s) Performed: XI ROBOTIC ASSISTED LAPAROSCOPIC HYSTERECTOMY AND SALPINGECTOMY (Bilateral Abdomen)     Patient location during evaluation: PACU Anesthesia Type: General Level of consciousness: awake and alert Pain management: pain level controlled Vital Signs Assessment: post-procedure vital signs reviewed and stable Respiratory status: spontaneous breathing, nonlabored ventilation, respiratory function stable and patient connected to nasal cannula oxygen Cardiovascular status: blood pressure returned to baseline and stable Postop Assessment: no apparent nausea or vomiting Anesthetic complications: no    Last Vitals:  Vitals:   08/24/19 1200 08/24/19 1215  BP: (!) 139/92 122/74  Pulse: 94 98  Resp: 16 20  Temp:  37.7 C  SpO2: 93% 95%    Last Pain:  Vitals:   08/24/19 1215  TempSrc:   PainSc: 0-No pain                 Leetta Hendriks DAVID

## 2019-08-28 LAB — SURGICAL PATHOLOGY

## 2019-08-31 NOTE — Discharge Summary (Signed)
Physician Discharge Summary  Patient ID: Ann Bass MRN: [id number] DOB/AGE: [birth year] 49 y.o.  Admit date: 08/24/2019 Discharge date: 08/25/2019   Discharge Diagnoses:  Active Problems:   Abnormal uterine bleeding Uterine Fibroids  Operation: Robot Assisted Total Laparoscopic Hysterectomy and Bilateral Salpingectomy   Discharged Condition: Good  Hospital Course: On the date of admission the patient underwent the aforementioned procedures and tolerated them well.  Post operative course was unremarkable with the patient resuming bowel and bladder function by post operative day #1 and was therefore deemed ready for discharge home.    Disposition: Discharge disposition: 01-Home or Self Care       Discharge Medications:  Allergies as of 08/24/2019      Reactions   Penicillins Rash      Medication List    STOP taking these medications   Junel 1/20 1-20 MG-MCG tablet Generic drug: norethindrone-ethinyl estradiol     TAKE these medications   ALLEGRA PO Take 180 mg by mouth daily.   Armour Thyroid 15 MG tablet Generic drug: thyroid TAKE 1 TABLET ON MONDAY AND THURSDAY (WITH 60MG  TABLET FOR 75MG  TOTALDOSE) What changed: See the new instructions.   Armour Thyroid 60 MG tablet Generic drug: thyroid TAKE 1 TABLET (60 MG TOTAL) BY MOUTH DAILY. AS DIRECTED What changed: See the new instructions.   cholecalciferol 25 MCG (1000 UNIT) tablet Commonly known as: VITAMIN D3 Take 1,000 Units by mouth daily.   citalopram 20 MG tablet Commonly known as: CELEXA TAKE 1 TABLET BY MOUTH EVERY DAY   fenofibrate 160 MG tablet TAKE 1 TABLET BY MOUTH EVERY DAY   Fish Oil 1000 MG Caps Take 1,000 mg by mouth daily.   ibuprofen 600 MG tablet Commonly known as: ADVIL take 1 tablet po pc every 6 hours for 5 days then prn   multivitamin tablet Take 1 tablet by mouth daily.   oxyCODONE 5 MG immediate release tablet Commonly known as: Roxicodone take 1 tablet po every 6 hours  prn for breakthrough post operative pain   vitamin C 1000 MG tablet Take 1,000 mg by mouth daily.        Follow-up: Dr. Katharine Look Rivard on Sep 28, 2019 at 9:30 a.m.   Signed: Earnstine Regal, PA-C 08/31/2019, 9:59 AM

## 2019-09-07 DIAGNOSIS — R309 Painful micturition, unspecified: Secondary | ICD-10-CM | POA: Diagnosis not present

## 2019-09-10 ENCOUNTER — Other Ambulatory Visit: Payer: Self-pay | Admitting: Family Medicine

## 2019-09-12 IMAGING — CR DG CHEST 2V
2 series · 2 of 2 positions shown · non-contrast
Comparison: None.

CLINICAL DATA: Shortness of breath and chest pain.

EXAM:
CHEST  2 VIEW

[w chest lat]
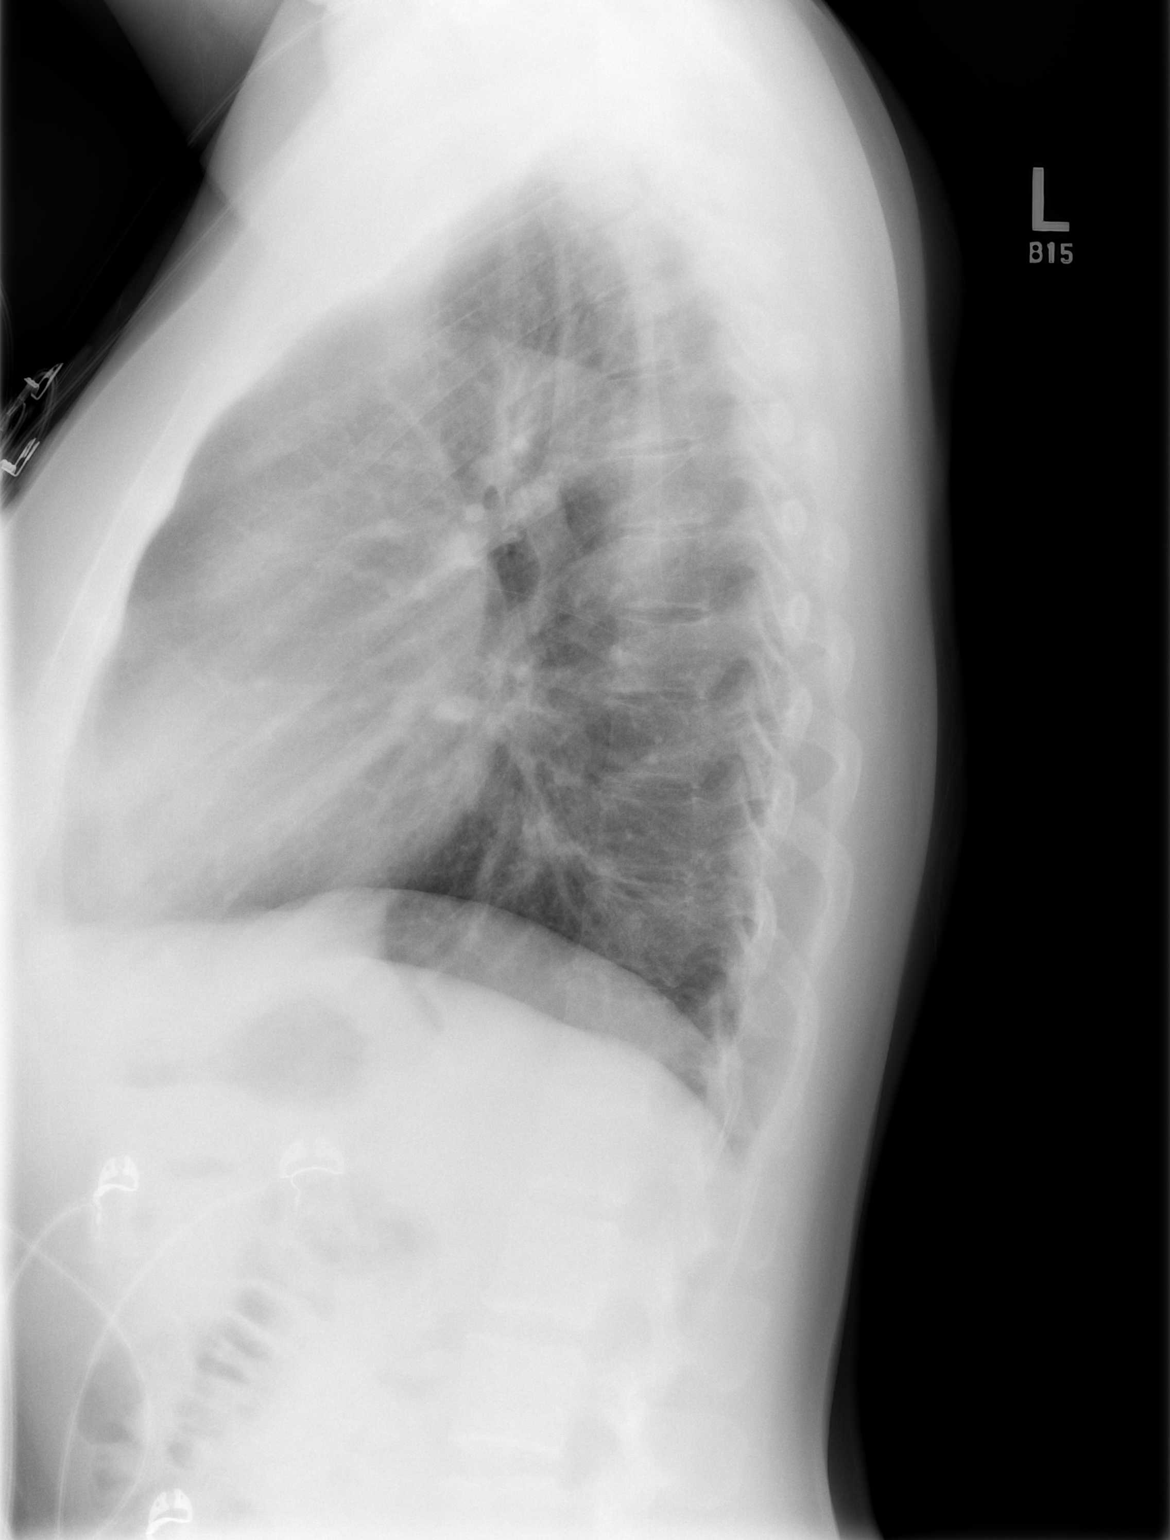

[w chest pa]
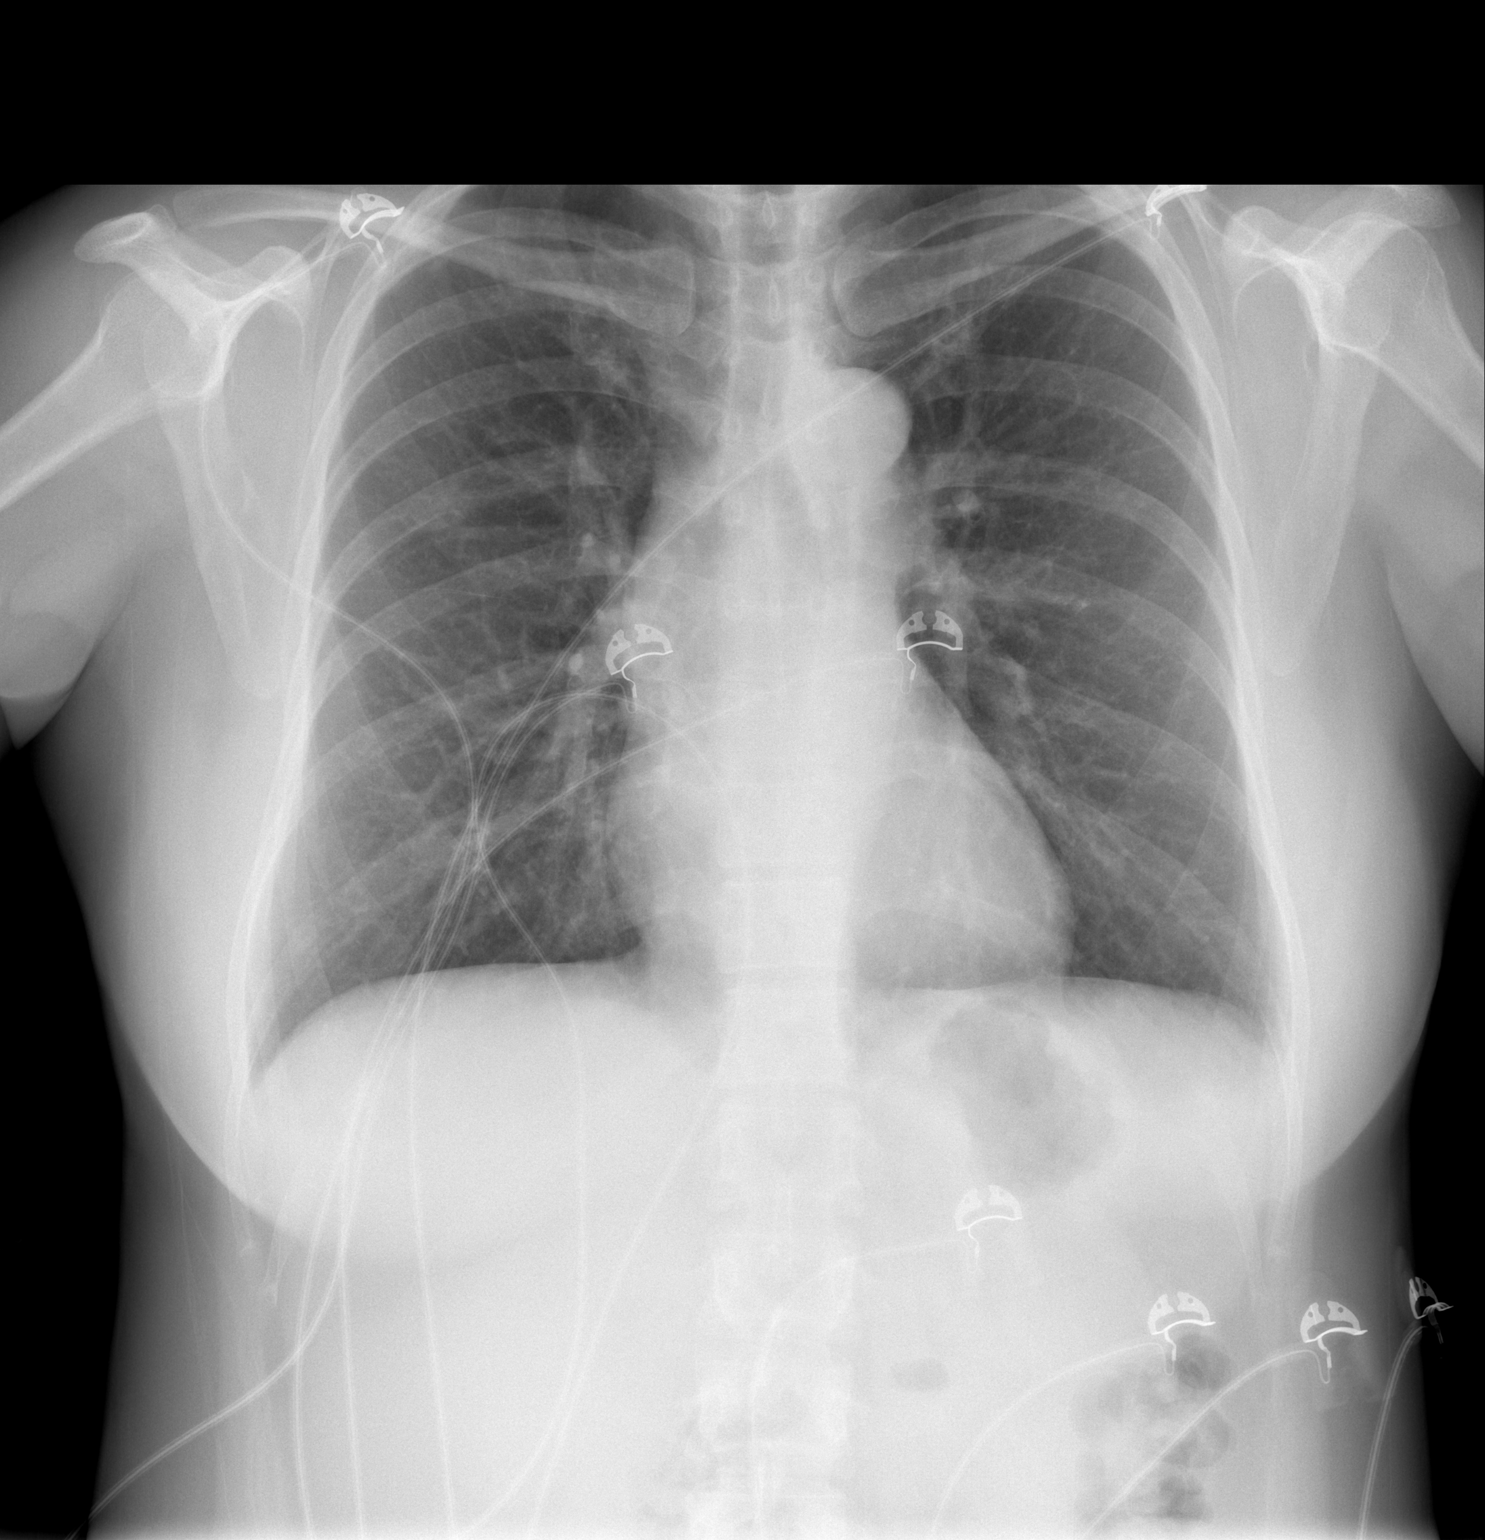

[2 of 2 positions shown; findings below may reference images not displayed]

FINDINGS: The heart size and mediastinal contours are within normal limits.
Both lungs are clear. The visualized skeletal structures are
unremarkable.
IMPRESSION: No active cardiopulmonary disease.

## 2019-09-19 ENCOUNTER — Other Ambulatory Visit: Payer: Self-pay

## 2019-09-19 ENCOUNTER — Ambulatory Visit (INDEPENDENT_AMBULATORY_CARE_PROVIDER_SITE_OTHER): Payer: BC Managed Care – PPO | Admitting: Family Medicine

## 2019-09-19 ENCOUNTER — Encounter: Payer: Self-pay | Admitting: Family Medicine

## 2019-09-19 VITALS — BP 112/76 | HR 72 | Temp 97.9°F | Resp 16 | Ht 62.0 in | Wt 150.4 lb

## 2019-09-19 DIAGNOSIS — R21 Rash and other nonspecific skin eruption: Secondary | ICD-10-CM | POA: Diagnosis not present

## 2019-09-19 DIAGNOSIS — L245 Irritant contact dermatitis due to other chemical products: Secondary | ICD-10-CM | POA: Diagnosis not present

## 2019-09-19 MED ORDER — PREDNISONE 10 MG PO TABS: ORAL_TABLET | ORAL | 0 refills | Status: DC

## 2019-09-19 NOTE — Progress Notes (Signed)
   Subjective:    Patient ID: Ann Bass, female    DOB: 10-24-1970, 49 y.o.   MRN: [id number]  HPI Rash- pt reports this has occurred yearly for last 3 yrs.  This year started on arms bilaterally and spread to legs.  Last year started on chest and then spread.  Saw derm previously and was dx'd w/ contact dermatitis.  Pt has no sun exposure recently.     Review of Systems For ROS see HPI   This visit occurred during the SARS-CoV-2 public health emergency.  Safety protocols were in place, including screening questions prior to the visit, additional usage of staff PPE, and extensive cleaning of exam room while observing appropriate contact time as indicated for disinfecting solutions.       Objective:   Physical Exam Vitals reviewed.  Constitutional:      General: She is not in acute distress.    Appearance: Normal appearance. She is not toxic-appearing or diaphoretic.  Skin:    General: Skin is warm and dry.     Findings: Rash (faint maculopapular rash on bilateral arms and legs.  erythematous areas surrounding each port site from recent hysterectomy) present.  Neurological:     General: No focal deficit present.     Mental Status: She is alert and oriented to person, place, and time.  Psychiatric:        Mood and Affect: Mood normal.        Behavior: Behavior normal.        Thought Content: Thought content normal.           Assessment & Plan:  Rash- pt reports this happens yearly around this time and is starting to think this is environmental.  Will start Prednisone taper due to the fact it is so widespread and refer to allergy for complete evaluation.  Pt expressed understanding and is in agreement w/ plan.   Contact dermatitis- new.  Pt w/ obvious irritation/reaction to the glue used to close her trochar sites from recent hysterectomy.  Recommended topical Triamcinolone BID- pt has this available to use at home.  Pt expressed understanding and is in agreement w/ plan.

## 2019-09-19 NOTE — Patient Instructions (Signed)
Follow up as needed or as scheduled START the Prednisone as directed- take w/ food We'll call you with your Allergy appt USE the Triamcinolone twice daily as needed Call with any questions or concerns Hang in there!

## 2019-09-28 DIAGNOSIS — R102 Pelvic and perineal pain: Secondary | ICD-10-CM | POA: Diagnosis not present

## 2019-11-03 ENCOUNTER — Ambulatory Visit: Payer: Self-pay | Admitting: Allergy

## 2019-11-09 ENCOUNTER — Other Ambulatory Visit: Payer: Self-pay

## 2019-11-09 ENCOUNTER — Encounter: Payer: Self-pay | Admitting: Allergy & Immunology

## 2019-11-09 ENCOUNTER — Ambulatory Visit (INDEPENDENT_AMBULATORY_CARE_PROVIDER_SITE_OTHER): Payer: BC Managed Care – PPO | Admitting: Allergy & Immunology

## 2019-11-09 VITALS — BP 120/74 | HR 68 | Temp 98.0°F | Resp 16 | Ht 62.0 in | Wt 150.8 lb

## 2019-11-09 DIAGNOSIS — J301 Allergic rhinitis due to pollen: Secondary | ICD-10-CM

## 2019-11-09 DIAGNOSIS — R21 Rash and other nonspecific skin eruption: Secondary | ICD-10-CM | POA: Diagnosis not present

## 2019-11-09 NOTE — Patient Instructions (Addendum)
1. Seasonal allergic rhinitis due to pollen - Testing today showed: trees and outdoor molds - Copy of test results provided.  - Avoidance measures provided. - Stop taking: Zyrtec since it is so sedating - Start taking: Allegra (fexofenadine) 180mg  table 1-2 times daily - You can use an extra dose of the antihistamine, if needed, for breakthrough symptoms.  - Consider nasal saline rinses 1-2 times daily to remove allergens from the nasal cavities as well as help with mucous clearance (this is especially helpful to do before the nasal sprays are given) - Consider allergy shots as a means of long-term control. - Allergy shots "re-train" and "reset" the immune system to ignore environmental allergens and decrease the resulting immune response to those allergens (sneezing, itchy watery eyes, runny nose, nasal congestion, etc).    - Allergy shots improve symptoms in 75-85% of patients.  - We can discuss more at the next appointment if the medications are not working for you (although I am not sure how much allergy shots will help this rash, to be honest).  2. Rash - I am unsure of the rash but I would like to see it when it recurs.   - If it were more eczematous, we could consider the addition of Dupixent. - Information on Dupixent provided. - In the meantime, I hope restarting the Allegra and using up to twice daily will help with the symptoms. - We we will avoid topical medicines since these seem to irritate it. - We are going to get some labs to look for serious causes of the rash, including autoimmune causes. - We will contact you in 1 to 2 weeks with the results of the testing.  3. Return in about 6 weeks (around 12/21/2019). This can be an in-person, a virtual Webex or a telephone follow up visit.   Please inform us of any Emergency Department visits, hospitalizations, or changes in symptoms. Call us before going to the ED for breathing or allergy symptoms since we might be able to fit you in  for a sick visit. Feel free to contact us anytime with any questions, problems, or concerns.  It was a pleasure to meet you today!  Websites that have reliable patient information: 1. American Academy of Asthma, Allergy, and Immunology: www.aaaai.org 2. Food Allergy Research and Education (FARE): foodallergy.org 3. Mothers of Asthmatics: http://www.asthmacommunitynetwork.org 4. American College of Allergy, Asthma, and Immunology: www.acaai.org   COVID-19 Vaccine Information can be found at: ShippingScam.co.uk For questions related to vaccine distribution or appointments, please email [email or web address] or call [phone].     "Like" [place] on Facebook and Instagram for our latest updates!        Make sure you are registered to vote! If you have moved or changed any of your contact information, you will need to get this updated before voting!  In some cases, you MAY be able to register to vote online: [email or web address]    Reducing Pollen Exposure  The American Academy of Allergy, Asthma and Immunology suggests the following steps to reduce your exposure to pollen during allergy seasons.    1. Do not hang sheets or clothing out to dry; pollen may collect on these items. 2. Do not mow lawns or spend time around freshly cut grass; mowing stirs up pollen. 3. Keep windows closed at night.  Keep car windows closed while driving. 4. Minimize morning activities outdoors, a time when pollen counts are usually at their highest. 5. Stay indoors as much as possible when pollen counts  or humidity is high and on windy days when pollen tends to remain in the air longer. 6. Use air conditioning when possible.  Many air conditioners have filters that trap the pollen spores. 7. Use a HEPA room air filter to remove pollen form the indoor air you breathe.  Control of Mold Allergen   Mold and fungi can grow  on a variety of surfaces provided certain temperature and moisture conditions exist.  Outdoor molds grow on plants, decaying vegetation and soil.  The major outdoor mold, Alternaria and Cladosporium, are found in very high numbers during hot and dry conditions.  Generally, a late Summer - Fall peak is seen for common outdoor fungal spores.  Rain will temporarily lower outdoor mold spore count, but counts rise rapidly when the rainy period ends.  The most important indoor molds are Aspergillus and Penicillium.  Dark, humid and poorly ventilated basements are ideal sites for mold growth.  The next most common sites of mold growth are the bathroom and the kitchen.  Outdoor (Seasonal) Mold Control  Positive outdoor molds via skin testing: Bipolaris (Helminthsporium), Drechslera (Curvalaria) and Mucor  1. Use air conditioning and keep windows closed 2. Avoid exposure to decaying vegetation. 3. Avoid leaf raking. 4. Avoid grain handling. 5. Consider wearing a face mask if working in moldy areas.       Allergy Shots   Allergies are the result of a chain reaction that starts in the immune system. Your immune system controls how your body defends itself. For instance, if you have an allergy to pollen, your immune system identifies pollen as an invader or allergen. Your immune system overreacts by producing antibodies called Immunoglobulin E (IgE). These antibodies travel to cells that release chemicals, causing an allergic reaction.  The concept behind allergy immunotherapy, whether it is received in the form of shots or tablets, is that the immune system can be desensitized to specific allergens that trigger allergy symptoms. Although it requires time and patience, the payback can be long-term relief.  How Do Allergy Shots Work?  Allergy shots work much like a vaccine. Your body responds to injected amounts of a particular allergen given in increasing doses, eventually developing a resistance and  tolerance to it. Allergy shots can lead to decreased, minimal or no allergy symptoms.  There generally are two phases: build-up and maintenance. Build-up often ranges from three to six months and involves receiving injections with increasing amounts of the allergens. The shots are typically given once or twice a week, though more rapid build-up schedules are sometimes used.  The maintenance phase begins when the most effective dose is reached. This dose is different for each person, depending on how allergic you are and your response to the build-up injections. Once the maintenance dose is reached, there are longer periods between injections, typically two to four weeks.  Occasionally doctors give cortisone-type shots that can temporarily reduce allergy symptoms. These types of shots are different and should not be confused with allergy immunotherapy shots.  Who Can Be Treated with Allergy Shots?  Allergy shots may be a good treatment approach for people with allergic rhinitis (hay fever), allergic asthma, conjunctivitis (eye allergy) or stinging insect allergy.   Before deciding to begin allergy shots, you should consider:  . The length of allergy season and the severity of your symptoms . Whether medications and/or changes to your environment can control your symptoms . Your desire to avoid long-term medication use . Time: allergy immunotherapy requires a major time  commitment . Cost: may vary depending on your insurance coverage  Allergy shots for children age 40 and older are effective and often well tolerated. They might prevent the onset of new allergen sensitivities or the progression to asthma.  Allergy shots are not started on patients who are pregnant but can be continued on patients who become pregnant while receiving them. In some patients with other medical conditions or who take certain common medications, allergy shots may be of risk. It is important to mention other  medications you talk to your allergist.   When Will I Feel Better?  Some may experience decreased allergy symptoms during the build-up phase. For others, it may take as long as 12 months on the maintenance dose. If there is no improvement after a year of maintenance, your allergist will discuss other treatment options with you.  If you aren't responding to allergy shots, it may be because there is not enough dose of the allergen in your vaccine or there are missing allergens that were not identified during your allergy testing. Other reasons could be that there are high levels of the allergen in your environment or major exposure to non-allergic triggers like tobacco smoke.  What Is the Length of Treatment?  Once the maintenance dose is reached, allergy shots are generally continued for three to five years. The decision to stop should be discussed with your allergist at that time. Some people may experience a permanent reduction of allergy symptoms. Others may relapse and a longer course of allergy shots can be considered.  What Are the Possible Reactions?  The two types of adverse reactions that can occur with allergy shots are local and systemic. Common local reactions include very mild redness and swelling at the injection site, which can happen immediately or several hours after. A systemic reaction, which is less common, affects the entire body or a particular body system. They are usually mild and typically respond quickly to medications. Signs include increased allergy symptoms such as sneezing, a stuffy nose or hives.  Rarely, a serious systemic reaction called anaphylaxis can develop. Symptoms include swelling in the throat, wheezing, a feeling of tightness in the chest, nausea or dizziness. Most serious systemic reactions develop within 30 minutes of allergy shots. This is why it is strongly recommended you wait in your doctor's office for 30 minutes after your injections. Your allergist is  trained to watch for reactions, and his or her staff is trained and equipped with the proper medications to identify and treat them.  Who Should Administer Allergy Shots?  The preferred location for receiving shots is your prescribing allergist's office. Injections can sometimes be given at another facility where the physician and staff are trained to recognize and treat reactions, and have received instructions by your prescribing allergist.

## 2019-11-09 NOTE — Progress Notes (Signed)
NEW PATIENT  Date of Service/Encounter:  11/09/19  Referring provider: [name], MD   Assessment:   Seasonal allergic rhinitis due to pollen (trees, outdoor molds)  Rash  Plan/Recommendations:   1. Seasonal allergic rhinitis due to pollen - Testing today showed: trees and outdoor molds - Copy of test results provided.  - Avoidance measures provided. - Stop taking: Zyrtec since it is so sedating - Start taking: Allegra (fexofenadine) 180mg  table 1-2 times daily - You can use an extra dose of the antihistamine, if needed, for breakthrough symptoms.  - Consider nasal saline rinses 1-2 times daily to remove allergens from the nasal cavities as well as help with mucous clearance (this is especially helpful to do before the nasal sprays are given) - Consider allergy shots as a means of long-term control. - Allergy shots "re-train" and "reset" the immune system to ignore environmental allergens and decrease the resulting immune response to those allergens (sneezing, itchy watery eyes, runny nose, nasal congestion, etc).    - Allergy shots improve symptoms in 75-85% of patients.  - We can discuss more at the next appointment if the medications are not working for you (although I am not sure how much allergy shots will help this rash, to be honest).  2. Rash - I am unsure of the rash but I would like to see it when it recurs.   - If it were more eczematous, we could consider the addition of Dupixent. - Information on Dupixent provided. - In the meantime, I hope restarting the Allegra and using up to twice daily will help with the symptoms. - We we will avoid topical medicines since these seem to irritate it. - We are going to get some labs to look for serious causes of the rash, including autoimmune causes. - We will contact you in 1 to 2 weeks with the results of the testing.  3. Return in about 6 weeks (around 12/21/2019). This can be an in-person, a virtual Webex or a  telephone follow up visit.  Subjective:   Ann Bass is a 49 y.o. female presenting today for evaluation of  Chief Complaint  Patient presents with  . Rash    sx x 6 years.  heat and exposure to pollen makes sx worse.  early spring and early summer is when rash usually begins.  . Pruritus    Dermatologist did patch testing. small rxn to thimerosal.  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 Allergic Rhinitis     Ann Bass has a history of the following: Patient Active Problem List   Diagnosis Date Noted  . Abnormal uterine bleeding 08/24/2019  . Steatosis of liver 01/13/2019  . Renal cyst, left 01/13/2019  . Overweight (BMI 25.0-29.9) 01/13/2019  . OSA (obstructive sleep apnea) 09/02/2017  . Ascending aortic aneurysm ([place])   . SOB (shortness of breath) 02/18/2017  . Excessive daytime sleepiness 02/18/2017  . Vitamin D deficiency 09/17/2016  . Physical exam 09/13/2014  . Palpitations 09/13/2014  . Decreased hearing of both ears 09/13/2014  . Glucose intolerance (impaired glucose tolerance) 02/22/2013  . Depression 02/22/2013  . Hypothyroid 02/22/2013    History obtained from: chart review and patient.  Ann Bass was referred by [name], MD.     Ann Bass is a 49 y.o. female presenting for an evaluation of a rash.  She reports that she had a rash that has been going on for 6 years. Now it has become more recurrent,  starting around May and continuing through the fall. She has tried a multitude of topical ointments, some of which make it worse. The rash does leave behind what looks like a sunburn. She has been put on prednisone for it, last in May 2021. She estimates that she gets prednisone around twice per year. She does not have pictures of when it looks particular terrible. Unfortunately, she does not have any pictures of this rash.   She saw Theora Gianotti around the corner. She did have patch testing  That was positive to thimerosal. Biopsy  demonstrated spongiotic dermatitis. Symptoms are perfectly fine during the winter. She does not think that there is any food involved in this process.  She does not believe she was ever diagnosed with eczema or urticaria.  She does eat a fair amount of red meat.  She was tested when she first moved here. She never had issues when she lived in [place]. She was tested at [facility] in [place]. She was never on allergy shots at all. She has felt that her symptoms overall have gotten worse over time.    Otherwise, there is no history of other atopic diseases, including food allergies, drug allergies, stinging insect allergies, urticaria or contact dermatitis. There is no significant infectious history. Vaccinations are up to date.    Past Medical History: Patient Active Problem List   Diagnosis Date Noted  . Abnormal uterine bleeding 08/24/2019  . Steatosis of liver 01/13/2019  . Renal cyst, left 01/13/2019  . Overweight (BMI 25.0-29.9) 01/13/2019  . OSA (obstructive sleep apnea) 09/02/2017  . Ascending aortic aneurysm ([place])   . SOB (shortness of breath) 02/18/2017  . Excessive daytime sleepiness 02/18/2017  . Vitamin D deficiency 09/17/2016  . Physical exam 09/13/2014  . Palpitations 09/13/2014  . Decreased hearing of both ears 09/13/2014  . Glucose intolerance (impaired glucose tolerance) 02/22/2013  . Depression 02/22/2013  . Hypothyroid 02/22/2013    Medication List:  Allergies as of 11/09/2019      Reactions   Penicillins Rash      Medication List       Accurate as of November 09, 2019 11:39 AM. If you have any questions, ask your nurse or doctor.        STOP taking these medications   phenazopyridine 200 MG tablet Commonly known as: PYRIDIUM Stopped by: Valentina Shaggy, MD   predniSONE 10 MG tablet Commonly known as: DELTASONE Stopped by: Valentina Shaggy, MD     TAKE these medications   Armour Thyroid 60 MG tablet Generic drug:  thyroid TAKE 1 TABLET (60 MG TOTAL) BY MOUTH DAILY. AS DIRECTED What changed: See the new instructions.   [facility]our Thyroid 15 MG tablet Generic drug: thyroid TAKE 1 TABLET ON MONDAY AND THURSDAY (WITH 60MG  TABLET FOR 75MG  TOTALDOSE) What changed: Another medication with the same name was changed. Make sure you understand how and when to take each.   bifidobacterium infantis capsule Take 1 capsule by mouth daily.   cetirizine 10 MG tablet Commonly known as: ZYRTEC Take 10 mg by mouth daily.   cholecalciferol 25 MCG (1000 UNIT) tablet Commonly known as: VITAMIN D3 Take 1,000 Units by mouth daily.   citalopram 20 MG tablet Commonly known as: CELEXA TAKE 1 TABLET BY MOUTH EVERY DAY   fenofibrate 160 MG tablet TAKE 1 TABLET BY MOUTH EVERY DAY   Fish Oil 1000 MG Caps Take 1,000 mg by mouth daily.   multivitamin tablet Take 1 tablet by  mouth daily.   vitamin C 1000 MG tablet Take 1,000 mg by mouth daily.       Birth History: non-contributory  Developmental History: non-contributory  Past Surgical History: Past Surgical History:  Procedure Laterality Date  . broken leg  1982   Left Femur, traction and pin in leg  . COLONOSCOPY  2018  . ROBOTIC ASSISTED LAPAROSCOPIC HYSTERECTOMY AND SALPINGECTOMY Bilateral 08/24/2019   Procedure: XI ROBOTIC ASSISTED LAPAROSCOPIC HYSTERECTOMY AND SALPINGECTOMY;  Surgeon: Delsa Bern, MD;  Location: [facility];  Service: Gynecology;  Laterality: Bilateral;  . WISDOM TOOTH EXTRACTION  1998     Family History: Family History  Problem Relation Age of Onset  . Diabetes Mother   . Cancer Father        colon  . Cancer Paternal Grandmother        ovarian  . Allergic rhinitis Neg Hx   . Angioedema Neg Hx   . Asthma Neg Hx   . Eczema Neg Hx   . Immunodeficiency Neg Hx   . Urticaria Neg Hx      Social History: Ann Bass lives at home with her family.  She is a stay-at-home mom.  Her husband is an Art gallery manager.   They have 3 children (ages 98, 84, and 75).  They live in a house that is 48 years old.  They are in the process of moving to [place].  They have gas, electric, and oil heating.  They have central cooling.  There is 1 dog in the home.  She does have dust mite covers on the bed, but not the pillows.  There is no tobacco exposure.   Review of Systems  Constitutional: Negative.  Negative for chills, fever, malaise/fatigue and weight loss.  HENT: Negative.  Negative for congestion, ear discharge and ear pain.   Eyes: Negative for pain, discharge and redness.  Respiratory: Negative for cough, sputum production, shortness of breath and wheezing.   Cardiovascular: Negative.  Negative for chest pain and palpitations.  Gastrointestinal: Negative for abdominal pain, constipation, diarrhea, heartburn, nausea and vomiting.  Skin: Positive for itching and rash.  Neurological: Negative for dizziness and headaches.  Endo/Heme/Allergies: Positive for environmental allergies. Does not bruise/bleed easily.       Objective:   Blood pressure 120/74, pulse 68, temperature 98 F (36.7 C), temperature source Oral, resp. rate 16, height 5\' 2"  (1.575 m), weight 150 lb 12.7 oz (68.4 kg), SpO2 99 %. Body mass index is 27.58 kg/m.   Physical Exam: General:  alert, active, in no acute distress.  Very pleasant talkative female. Head:  normocephalic, no masses, lesions, tenderness or abnormalities Eyes:  conjunctiva clear without injection or discharge, EOMI, PERL Ears:  TM's pearly white bilaterally, external auditory canals are clear, external ears are normally set and rotated Nose:  External nose within normal limits, normal appearing turbinates, scant clear-colored discharge, septum midline.  Turbinates are erythematous. Throat:  moist mucous membranes without erythema, exudates or petechiae, no thrush Neck:  Supple without thyromegaly or adenopathy appreciated Lungs:  clear to auscultation, no wheezing,  crackles or rhonchi, breathing unlabored, moving air well in all lung fields. Heart:  regular rate and rhythm, normal S1/S2, no murmurs or gallops, normal peripheral perfusion Abdomen:  Soft, non-tender, BS normal, no masses, no organomegaly Neuro:  Normal mental status, speech normal, alert and oriented x3 Musculoskeletal:  no cyanosis, clubbing or edema Skin: She does have some faint erythema on her upper chest that is blanchable . Otherwise the skin  color, texture and turgor are normal; no bruising, rashes or lesions noted. Psych: Normal Affect and mood   Diagnostic studies:   Allergy Studies:     Airborne Adult Perc - 11/09/19 1023    Time Antigen Placed 1012    Allergen Manufacturer Lavella Hammock    Location Back    Number of Test 59    Panel 1 Select    1. Control-Buffer 50% Glycerol Negative    2. Control-Histamine 1 mg/ml 3+    3. Albumin saline Negative    4. Mahomet Negative    5. Guatemala Negative    6. Johnson Negative    7. Lucan Blue Negative    8. Meadow Fescue Negative    9. Perennial Rye Negative    10. Sweet Vernal Negative    11. Timothy Negative    12. Cocklebur Negative    13. Burweed Marshelder Negative    14. Ragweed, short Negative    15. Ragweed, Giant Negative    16. Plantain,  English Negative    17. Lamb's Quarters Negative    18. Sheep Sorrell Negative    19. Rough Pigweed Negative    20. Marsh Elder, Rough Negative    21. Mugwort, Common Negative    22. Ash mix 3+    23. Birch mix Negative    24. Beech American Negative    25. Box, Elder Negative    26. Cedar, red Negative    27. Cottonwood, [place] Negative    28. Elm mix Negative    29. Hickory Negative    30. Maple mix Negative    31. Oak, [place] mix 2+    32. Pecan Pollen 2+    33. Pine mix Negative    34. Sycamore Eastern Negative    35. Franklin, Black Pollen 2+    36. Alternaria alternata Negative    37. Cladosporium Herbarum Negative    38. Aspergillus mix Negative    39.  Penicillium mix Negative    40. Bipolaris sorokiniana (Helminthosporium) Negative    41. Drechslera spicifera (Curvularia) Negative    42. Mucor plumbeus Negative    43. Fusarium moniliforme Negative    44. Aureobasidium pullulans (pullulara) Negative    45. Rhizopus oryzae Negative    46. Botrytis cinera Negative    47. Epicoccum nigrum Negative    48. Phoma betae Negative    49. Candida Albicans Negative    50. Trichophyton mentagrophytes Negative    51. Mite, D Farinae  5,000 AU/ml Negative    52. Mite, D Pteronyssinus  5,000 AU/ml Negative    53. Cat Hair 10,000 BAU/ml Negative    54.  Dog Epithelia Negative    55. Mixed Feathers Negative    56. Horse Epithelia Negative    57. Cockroach, German Negative    58. Mouse Negative    59. Tobacco Leaf Negative          Intradermal - 11/09/19 1048    Time Antigen Placed 1043    Allergen Manufacturer Lavella Hammock    Location Arm    Number of Test 14    Intradermal Select    Control Negative    Guatemala Negative    Johnson Negative    7 Grass 1+    Ragweed mix Negative    Weed mix Negative    Mold 1 Negative    Mold 2 Negative    Mold 3 1+    Mold 4 Negative    Cat Negative  Dog Negative    Cockroach Negative    Mite mix Negative           Allergy testing results were read and interpreted by myself, documented by clinical staff.         Salvatore Marvel, MD [facility]

## 2019-11-15 ENCOUNTER — Other Ambulatory Visit: Payer: Self-pay | Admitting: Family Medicine

## 2019-12-06 ENCOUNTER — Other Ambulatory Visit: Payer: Self-pay | Admitting: Family Medicine

## 2019-12-20 ENCOUNTER — Encounter: Payer: Self-pay | Admitting: Cardiology

## 2019-12-20 ENCOUNTER — Telehealth: Payer: Self-pay | Admitting: Cardiology

## 2019-12-20 NOTE — Telephone Encounter (Signed)
Left message for patient regarding appointment  for Cardiac MRI (ordfered by Dr. Radford Pax) scheduled Friday  02/02/20 at 8:00 am---arrival time is 7:30 am 1st floor admissions office at [facility]----will mail information to patient and it is also available in My Chart.  Ask patient to please call [phone] with any questions or concerns.

## 2019-12-21 ENCOUNTER — Ambulatory Visit: Payer: BC Managed Care – PPO | Admitting: Allergy & Immunology

## 2019-12-22 ENCOUNTER — Other Ambulatory Visit: Payer: Self-pay | Admitting: Family Medicine

## 2019-12-26 ENCOUNTER — Encounter: Payer: Self-pay | Admitting: Allergy & Immunology

## 2020-01-01 LAB — CMP14+EGFR
ALT: 38 IU/L — ABNORMAL HIGH (ref 0–32)
AST: 43 IU/L — ABNORMAL HIGH (ref 0–40)
Albumin/Globulin Ratio: 1.7 (ref 1.2–2.2)
Albumin: 4.3 g/dL (ref 3.8–4.8)
Alkaline Phosphatase: 61 IU/L (ref 48–121)
BUN/Creatinine Ratio: 13 (ref 9–23)
BUN: 8 mg/dL (ref 6–24)
Bilirubin Total: 0.3 mg/dL (ref 0.0–1.2)
CO2: 22 mmol/L (ref 20–29)
Calcium: 9.9 mg/dL (ref 8.7–10.2)
Chloride: 101 mmol/L (ref 96–106)
Creatinine, Ser: 0.64 mg/dL (ref 0.57–1.00)
GFR calc Af Amer: 122 mL/min/{1.73_m2} (ref >59)
GFR calc non Af Amer: 106 mL/min/{1.73_m2} (ref >59)
Globulin, Total: 2.6 g/dL (ref 1.5–4.5)
Glucose: 89 mg/dL (ref 65–99)
Potassium: 4.9 mmol/L (ref 3.5–5.2)
Sodium: 139 mmol/L (ref 134–144)
Total Protein: 6.9 g/dL (ref 6.0–8.5)

## 2020-01-01 LAB — ALPHA-GAL PANEL

## 2020-01-01 LAB — SEDIMENTATION RATE: Sed Rate: 2 mm/hr (ref 0–32)

## 2020-01-01 LAB — C-REACTIVE PROTEIN: CRP: 1 mg/L (ref 0–10)

## 2020-01-01 LAB — CHRONIC URTICARIA: cu index: 1.8 (ref <10)

## 2020-01-01 LAB — ANA W/REFLEX IF POSITIVE: Anti Nuclear Antibody (ANA): NEGATIVE

## 2020-01-01 LAB — TRYPTASE: Tryptase: 3.1 ug/L (ref 2.2–13.2)

## 2020-01-11 ENCOUNTER — Encounter: Payer: Self-pay | Admitting: Allergy & Immunology

## 2020-01-11 ENCOUNTER — Ambulatory Visit (INDEPENDENT_AMBULATORY_CARE_PROVIDER_SITE_OTHER): Payer: BC Managed Care – PPO | Admitting: Allergy & Immunology

## 2020-01-11 ENCOUNTER — Other Ambulatory Visit: Payer: Self-pay

## 2020-01-11 VITALS — BP 122/86 | HR 62 | Temp 98.0°F | Resp 18 | Ht 62.0 in | Wt 150.8 lb

## 2020-01-11 DIAGNOSIS — J301 Allergic rhinitis due to pollen: Secondary | ICD-10-CM

## 2020-01-11 DIAGNOSIS — R21 Rash and other nonspecific skin eruption: Secondary | ICD-10-CM | POA: Diagnosis not present

## 2020-01-11 DIAGNOSIS — R7989 Other specified abnormal findings of blood chemistry: Secondary | ICD-10-CM | POA: Diagnosis not present

## 2020-01-11 MED ORDER — LEVOCETIRIZINE DIHYDROCHLORIDE 5 MG PO TABS: 5.0000 mg | ORAL_TABLET | Freq: Every evening | ORAL | 5 refills | Status: DC

## 2020-01-11 NOTE — Patient Instructions (Addendum)
1. Seasonal allergic rhinitis due to pollen (trees, outdoor molds) with overlying urticaria - Start taking: Xyzal (levocetirizine) 5mg  1-2 times daily during the spring/summer to keep the hives at [place] (switch to Allegra during the inter since this is less sedating). - Consider Xolair from March through August to control the hives next year (information provided). - We will let your PCP workup the elevated LFTs (I am going to recheck them to trend the numbers).   2. Return in about 6 months (around 07/13/2020).   Please inform us of any Emergency Department visits, hospitalizations, or changes in symptoms. Call us before going to the ED for breathing or allergy symptoms since we might be able to fit you in for a sick visit. Feel free to contact us anytime with any questions, problems, or concerns.  It was a pleasure to see you again today!  Websites that have reliable patient information: 1. American Academy of Asthma, Allergy, and Immunology: www.aaaai.org 2. Food Allergy Research and Education (FARE): foodallergy.org 3. Mothers of Asthmatics: http://www.asthmacommunitynetwork.org 4. American College of Allergy, Asthma, and Immunology: www.acaai.org   COVID-19 Vaccine Information can be found at: ShippingScam.co.uk For questions related to vaccine distribution or appointments, please email vaccine@[email or web address] or call [phone].     "Like" [place] on Facebook and Instagram for our latest updates!        Make sure you are registered to vote! If you have moved or changed any of your contact information, you will need to get this updated before voting!  In some cases, you MAY be able to register to vote online: [email or web address]

## 2020-01-11 NOTE — Progress Notes (Signed)
FOLLOW UP  Date of Service/Encounter:  01/11/20   Assessment:   Seasonal allergic rhinitis due to pollen (trees, outdoor molds)  Rash - more urticarial per the pictures (occurs March through August)  Elevated LFTs   Plan/Recommendations:   1. Seasonal allergic rhinitis due to pollen (trees, outdoor molds) with overlying urticaria - Start taking: Xyzal (levocetirizine) 5mg  1-2 times daily during the spring/summer to keep the hives at [place] (switch to Allegra during the inter since this is less sedating). - Consider Xolair from March through August to control the hives next year (information provided). - We will let your PCP workup the elevated LFTs (I am going to recheck them to trend the numbers).   2. Return in about 6 months (around 07/13/2020).  Subjective:   Ann Bass is a 49 y.o. female presenting today for follow up of  Chief Complaint  Patient presents with  . Allergic Rhinitis     no issues, She did have one rash at one point in time but she was able to clear it up.     Ann Bass has a history of the following: Patient Active Problem List   Diagnosis Date Noted  . Abnormal uterine bleeding 08/24/2019  . Steatosis of liver 01/13/2019  . Renal cyst, left 01/13/2019  . Overweight (BMI 25.0-29.9) 01/13/2019  . OSA (obstructive sleep apnea) 09/02/2017  . Ascending aortic aneurysm ([place])   . SOB (shortness of breath) 02/18/2017  . Excessive daytime sleepiness 02/18/2017  . Vitamin D deficiency 09/17/2016  . Physical exam 09/13/2014  . Palpitations 09/13/2014  . Decreased hearing of both ears 09/13/2014  . Glucose intolerance (impaired glucose tolerance) 02/22/2013  . Depression 02/22/2013  . Hypothyroid 02/22/2013    History obtained from: chart review and patient.  Ann Bass is a 49 y.o. female presenting for a follow up visit.  She was last seen in June 2021.  At that time, she was positive to trees and outdoor molds.  We stopped her Zyrtec and  started Allegra instead 1-2 times daily.  The rash was less clear.  I hope that restarting the Allegra up to twice daily would help avoid it.  We did get labs to rule out serious causes of hives, and these were all normal including an alpha gal panel and an ANA as well as inflammatory markers.  Tryptase was normal.  Since the last visit, she has done well. She did send me a picture of her MyChart.  She reports that the rash started on July 13 when she was at the beach.  She kept it under control with triamcinolone. She just had itching with this. She had no systemic symptom other than the itching with this. She was driving to the beach and she was sitting for an extended period of time. This happens only every summer. She gets her first rash in April or May and the last one is August or so. She does better when she is in the [facility], but going to the pool as well as sun makes it worse. If this gets out of control, it will remain for weeks. It is mostly on her chest and upper body as well as back.   She literally has no problems during the winter months. She lives normally in the winter and goes outside and does not alter her life significantly. She does use Allegra throughout the year and Zyrtec during the summer.   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 She was  on Zyrtec once daily when she had this. But she gets sedation from this. 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 She has never been on Xyzal. She has never been on Singulair.     Otherwise, there have been no changes to her past medical history, surgical history, family history, or social history.    Review of Systems  Constitutional: Negative.  Negative for chills, fever, malaise/fatigue and weight loss.  HENT: Negative.  Negative for congestion, ear discharge and ear pain.   Eyes: Negative for pain, discharge and redness.  Respiratory: Negative for cough, sputum production, shortness of breath and wheezing.    Cardiovascular: Negative.  Negative for chest pain and palpitations.  Gastrointestinal: Negative for abdominal pain, constipation, diarrhea, heartburn, nausea and vomiting.  Skin: Positive for itching and rash.  Neurological: Negative for dizziness and headaches.  Endo/Heme/Allergies: Negative for environmental allergies. Does not bruise/bleed easily.       Objective:   Blood pressure 122/86, pulse 62, temperature 98 F (36.7 C), resp. rate 18, height 5\' 2"  (1.575 m), weight 150 lb 12.8 oz (68.4 kg), SpO2 96 %. Body mass index is 27.58 kg/m.   Physical Exam:  Physical Exam Constitutional:      Appearance: She is well-developed.  HENT:     Head: Normocephalic and atraumatic.     Right Ear: Tympanic membrane, ear canal and external ear normal.     Left Ear: Tympanic membrane and ear canal normal.     Nose: No nasal deformity, septal deviation, mucosal edema or rhinorrhea.     Right Sinus: No maxillary sinus tenderness or frontal sinus tenderness.     Left Sinus: No maxillary sinus tenderness or frontal sinus tenderness.     Mouth/Throat:     Mouth: Mucous membranes are not pale and not dry.     Pharynx: Uvula midline.  Eyes:     General:        Right eye: No discharge.        Left eye: No discharge.     Conjunctiva/sclera: Conjunctivae normal.     Right eye: Right conjunctiva is not injected. No chemosis.    Left eye: Left conjunctiva is not injected. No chemosis.    Pupils: Pupils are equal, round, and reactive to light.  Cardiovascular:     Rate and Rhythm: Normal rate and regular rhythm.     Heart sounds: Normal heart sounds.  Pulmonary:     Effort: Pulmonary effort is normal. No tachypnea, accessory muscle usage or respiratory distress.     Breath sounds: Normal breath sounds. No wheezing, rhonchi or rales.  Chest:     Chest wall: No tenderness.  Lymphadenopathy:     Cervical: No cervical adenopathy.  Skin:    General: Skin is warm and moist.     Capillary  Refill: Capillary refill takes less than 2 seconds.     Coloration: Skin is not pale.     Findings: No abrasion, erythema, petechiae or rash. Rash is not papular, urticarial or vesicular.     Comments: No eczematous or urticaria lesions noted today. There is no dermatographism. There does not appear to be residual skin changes.   Neurological:     Mental Status: She is alert.      Diagnostic studies: labs sent instead     Salvatore Marvel, MD  [facility]

## 2020-01-12 LAB — LIPASE: Lipase: 29 U/L (ref 14–72)

## 2020-01-12 LAB — HEPATIC FUNCTION PANEL
ALT: 36 IU/L — ABNORMAL HIGH (ref 0–32)
AST: 33 IU/L (ref 0–40)
Albumin: 4.6 g/dL (ref 3.8–4.8)
Alkaline Phosphatase: 53 IU/L (ref 48–121)
Bilirubin Total: 0.4 mg/dL (ref 0.0–1.2)
Bilirubin, Direct: 0.11 mg/dL (ref 0.00–0.40)
Total Protein: 6.6 g/dL (ref 6.0–8.5)

## 2020-01-26 ENCOUNTER — Other Ambulatory Visit: Payer: Self-pay | Admitting: Family Medicine

## 2020-01-29 ENCOUNTER — Other Ambulatory Visit: Payer: Self-pay | Admitting: General Practice

## 2020-01-29 MED ORDER — FENOFIBRATE 160 MG PO TABS: 160.0000 mg | ORAL_TABLET | Freq: Every day | ORAL | 1 refills | Status: DC

## 2020-01-31 ENCOUNTER — Telehealth (HOSPITAL_COMMUNITY): Payer: Self-pay | Admitting: Emergency Medicine

## 2020-01-31 NOTE — Telephone Encounter (Signed)
Attempted to call patient regarding upcoming cardiac CT appointment. °Left message on voicemail with name and callback number °Maddalyn Lutze RN Navigator Cardiac Imaging °[facility] °[phone] Office °[phone] Cell ° °

## 2020-02-01 ENCOUNTER — Telehealth (HOSPITAL_COMMUNITY): Payer: Self-pay | Admitting: Emergency Medicine

## 2020-02-01 ENCOUNTER — Other Ambulatory Visit: Payer: Self-pay

## 2020-02-01 DIAGNOSIS — I712 Thoracic aortic aneurysm, without rupture, unspecified: Secondary | ICD-10-CM

## 2020-02-01 NOTE — Progress Notes (Signed)
MRA of the chest per Dr. Radford Pax

## 2020-02-01 NOTE — Telephone Encounter (Signed)
Attempted to call patient regarding upcoming cardiac CT appointment. °Left message on voicemail with name and callback number °Jazmarie Biever RN Navigator Cardiac Imaging °[facility] °[phone] Office °[phone] Cell ° °

## 2020-02-01 NOTE — Telephone Encounter (Signed)
Pt returning phone call regarding upcoming cardiac imaging study; pt verbalizes understanding of appt date/time, parking situation and where to check in, and verified current allergies; name and call back number provided for further questions should they arise Marchia Bond RN Navigator Cardiac Imaging [facility] [phone] office [phone] cell   Pt denies implants, denies claustro

## 2020-02-02 ENCOUNTER — Other Ambulatory Visit: Payer: Self-pay

## 2020-02-02 ENCOUNTER — Ambulatory Visit (HOSPITAL_COMMUNITY)
Admission: RE | Admit: 2020-02-02 | Discharge: 2020-02-02 | Disposition: A | Discharge status: Home / Self Care | Payer: BC Managed Care – PPO | Source: Ambulatory Visit | Attending: Cardiology | Admitting: Cardiology

## 2020-02-02 DIAGNOSIS — I712 Thoracic aortic aneurysm, without rupture, unspecified: Secondary | ICD-10-CM

## 2020-02-02 DIAGNOSIS — I728 Aneurysm of other specified arteries: Secondary | ICD-10-CM | POA: Diagnosis not present

## 2020-02-02 DIAGNOSIS — Z8679 Personal history of other diseases of the circulatory system: Secondary | ICD-10-CM | POA: Diagnosis not present

## 2020-02-02 MED ORDER — GADOBUTROL 1 MMOL/ML IV SOLN
7.0000 mL | Freq: Once | INTRAVENOUS | Status: AC | PRN
  Administered 2020-02-02: 7 mL via INTRAVENOUS

## 2020-02-05 ENCOUNTER — Telehealth: Payer: Self-pay

## 2020-02-05 DIAGNOSIS — I712 Thoracic aortic aneurysm, without rupture, unspecified: Secondary | ICD-10-CM

## 2020-02-05 DIAGNOSIS — I7121 Aneurysm of the ascending aorta, without rupture: Secondary | ICD-10-CM

## 2020-02-05 NOTE — Telephone Encounter (Signed)
-----   Message from Sueanne Margarita, MD sent at 02/02/2020  4:35 PM EDT ----- Unchanged small ascending aortic aneurysm measuring 30mm - repeat Chest MRA in 1 year for followup.  She also has a splenic artery aneurysm 48mm that is unchanged from prior study but Radiology recommends vascular referral to follow given her young age.  Please refer to Dr. Arita Miss

## 2020-02-05 NOTE — Telephone Encounter (Signed)
Left message for patient with results (ok per DPR). Advised to call back with any questions.

## 2020-02-06 ENCOUNTER — Telehealth: Payer: Self-pay | Admitting: Cardiology

## 2020-02-06 NOTE — Telephone Encounter (Signed)
Left message for patient to call back  

## 2020-02-06 NOTE — Telephone Encounter (Signed)
Patient wants to know why a referral for to Vascular Surgery was sent. She is requesting more details. Please advise.

## 2020-02-07 ENCOUNTER — Encounter: Payer: Self-pay | Admitting: Cardiology

## 2020-02-07 ENCOUNTER — Telehealth (INDEPENDENT_AMBULATORY_CARE_PROVIDER_SITE_OTHER): Payer: BC Managed Care – PPO | Admitting: Cardiology

## 2020-02-07 ENCOUNTER — Other Ambulatory Visit: Payer: Self-pay

## 2020-02-07 VITALS — Ht 62.0 in | Wt 150.0 lb

## 2020-02-07 DIAGNOSIS — I712 Thoracic aortic aneurysm, without rupture, unspecified: Secondary | ICD-10-CM

## 2020-02-07 DIAGNOSIS — I491 Atrial premature depolarization: Secondary | ICD-10-CM | POA: Diagnosis not present

## 2020-02-07 DIAGNOSIS — G4733 Obstructive sleep apnea (adult) (pediatric): Secondary | ICD-10-CM | POA: Diagnosis not present

## 2020-02-07 DIAGNOSIS — I7121 Aneurysm of the ascending aorta, without rupture: Secondary | ICD-10-CM

## 2020-02-07 DIAGNOSIS — R0602 Shortness of breath: Secondary | ICD-10-CM

## 2020-02-07 DIAGNOSIS — E7800 Pure hypercholesterolemia, unspecified: Secondary | ICD-10-CM | POA: Diagnosis not present

## 2020-02-07 MED ORDER — ATORVASTATIN CALCIUM 20 MG PO TABS: 20.0000 mg | ORAL_TABLET | Freq: Every day | ORAL | 3 refills | Status: DC

## 2020-02-07 NOTE — Addendum Note (Signed)
Addended by: Antonieta Iba on: 02/07/2020 08:36 AM   Modules accepted: Orders

## 2020-02-07 NOTE — Patient Instructions (Signed)
Dr. Radford Pax recommends that you purchase an Omron Blood Pressure Cuff. Please take your blood pressure daily at lunch time and send [place] a message with your readings after one week.   Medication Instructions:  Your physician has recommended you make the following change in your medication:  1) START taking Lipitor (atorvastatin) 20 mg daily  *If you need a refill on your cardiac medications before your next appointment, please call your pharmacy*   Lab Work: Fasting lipids and ALT in 6 weeks.  If you have labs (blood work) drawn today and your tests are completely normal, you will receive your results only by: Marland Kitchen MyChart Message (if you have MyChart) OR . A paper copy in the mail If you have any lab test that is abnormal or we need to change your treatment, we will call you to review the results.   Testing/Procedures: Your physician has requested that you have an exercise tolerance test. For further information please visit [email or web address]. Please also follow instruction sheet, as given.  Your physician has requested that you have Calcium score CT scan.   Your physician has requested that you have a repeat cardiac MRI in one year. Cardiac MRI uses a computer to create images of your heart as its beating, producing both still and moving pictures of your heart and major blood vessels. For further information please visit [email or web address] Please follow the instruction sheet given to you today for more information.  Follow-Up: At [facility], you and your health needs are our priority.  As part of our continuing mission to provide you with exceptional heart care, we have created designated Provider Care Teams.  These Care Teams include your primary Cardiologist (physician) and Advanced Practice Providers (APPs -  Physician Assistants and Nurse Practitioners) who all work together to provide you with the care you need, when you need it.  Your next appointment:   1 year(s)  The format  for your next appointment:   In Person  Provider:   You may see Fransico Him, MD or one of the following Advanced Practice Providers on your designated Care Team:    Melina Copa, PA-C  Ermalinda Barrios, PA-C

## 2020-02-07 NOTE — Progress Notes (Signed)
Virtual Visit via Telephone Note   This visit type was conducted due to national recommendations for restrictions regarding the COVID-19 Pandemic (e.g. social distancing) in an effort to limit this patient's exposure and mitigate transmission in our community.  Due to her co-morbid illnesses, this patient is at least at moderate risk for complications without adequate follow up.  This format is felt to be most appropriate for this patient at this time.  The patient did not have access to video technology/had technical difficulties with video requiring transitioning to audio format only (telephone).  All issues noted in this document were discussed and addressed.  No physical exam could be performed with this format.  Please refer to the patient's chart for her  consent to telehealth for [facility].   Evaluation Performed:  Follow-up visit  This visit type was conducted due to national recommendations for restrictions regarding the COVID-19 Pandemic (e.g. social distancing).  This format is felt to be most appropriate for this patient at this time.  All issues noted in this document were discussed and addressed.  No physical exam was performed (except for noted visual exam findings with Video Visits).  Please refer to the patient's chart (MyChart message for video visits and phone note for telephone visits) for the patient's consent to telehealth for [facility].  Date:  02/07/2020   ID:  Ann Bass, DOB 15-Dec-1970, MRN [id number]  Patient Location:  Home  Provider location:   [facility]  Date:  02/07/2020   ID:  Ann Bass, DOB September 15, 1970, MRN [id number]  PCP:  [name], MD  Cardiologist:  Fransico Him, MD    Referring MD: [name], MD   Chief Complaint  Patient presents with  . Sleep Apnea    History of Present Illness:    Ann Bass is a 49 y.o. female with a hx of palpitations and shortness of breath. 2D echocardiogram showed normal  LV function with mildly dilated a sending aorta at 39 mmHg otherwise normal 2D echocardiogram. Exercise stress test noted showed no inducible ischemia. Event monitor was normal except for a rare PAC. She also hasmild obstructive sleep apnea with an AHI of 6.8/hr. Lowest oxygen saturation drop was 82%. She is on CPAP.   She is doing well with her CPAP device and thinks that she has gotten used to it.  SHe tolerates the mask and feels the pressure is adequate.  Since going on CPAP she feels rested in the am and has no significant daytime sleepiness.  She denies any significant mouth or nasal dryness or nasal congestion.  She does not think that he snores.    She is here today for followup and is doing well.  She has chronic DOE that has been present for several years.  2D echo and stress test in the past have been normal.  She denies any chest pain or pressure,  PND, orthopnea, LE edema, dizziness, palpitations or syncope. She is compliant with her meds and is tolerating meds with no SE.     Past Medical History:  Diagnosis Date  . Allergy   . Depression    post-partum  . Dilated aortic root ([place])    58mm by chest MRI 01/2020  . Dyspnea   . Dysrhythmia    occational  . History of gestational diabetes   . Inflammatory polyps of colon ([place])   . OSA (obstructive sleep apnea) 09/02/2017   Mild OSA with an AHI of 6.8/h.  On CPAP  . Palpitations    PACs noted on event monitor  . Pre-diabetes    updated epic hx 08-23-2019 per pt she is pre-diabetic    Past Surgical History:  Procedure Laterality Date  . broken leg  1982   Left Femur, traction and pin in leg  . COLONOSCOPY  2018  . ROBOTIC ASSISTED LAPAROSCOPIC HYSTERECTOMY AND SALPINGECTOMY Bilateral 08/24/2019   Procedure: XI ROBOTIC ASSISTED LAPAROSCOPIC HYSTERECTOMY AND SALPINGECTOMY;  Surgeon: Delsa Bern, MD;  Location: [place];  Service: Gynecology;  Laterality: Bilateral;  . WISDOM TOOTH EXTRACTION  1998     Current Medications: Current Meds  Medication Sig  . ARMOUR THYROID 15 MG tablet TAKE 1 TABLET ON MONDAY AND THURSDAY (WITH 60MG  TABLET FOR 75MG  TOTALDOSE)  . ARMOUR THYROID 60 MG tablet TAKE 1 TABLET (60 MG TOTAL) BY MOUTH DAILY. AS DIRECTED  . Ascorbic Acid (VITAMIN C) 1000 MG tablet Take 1,000 mg by mouth daily.  . cholecalciferol (VITAMIN D3) 25 MCG (1000 UNIT) tablet Take 1,000 Units by mouth daily.  . citalopram (CELEXA) 20 MG tablet TAKE 1 TABLET BY MOUTH EVERY DAY  . fenofibrate 160 MG tablet Take 1 tablet (160 mg total) by mouth daily.  . fexofenadine (ALLEGRA) 30 MG/5ML suspension Take 30 mg by mouth daily.  Marland Kitchen levocetirizine (XYZAL) 5 MG tablet Take 1 tablet (5 mg total) by mouth every evening.  . Multiple Vitamin (MULTIVITAMIN) tablet Take 1 tablet by mouth daily.  . Omega-3 Fatty Acids (FISH OIL) 1000 MG CAPS Take 1,000 mg by mouth daily.      Allergies:   Penicillins   Social History   Socioeconomic History  . Marital status: Married    Spouse name: Not on file  . Number of children: Not on file  . Years of education: Not on file  . Highest education level: Not on file  Occupational History  . Not on file  Tobacco Use  . Smoking status: Never Smoker  . Smokeless tobacco: Never Used  Vaping Use  . Vaping Use: Never used  Substance and Sexual Activity  . Alcohol use: Yes    Comment: rare  . Drug use: No  . Sexual activity: Yes  Other Topics Concern  . Not on file  Social History Narrative  . Not on file   Social Determinants of Health   Financial Resource Strain:   . Difficulty of Paying Living Expenses: Not on file  Food Insecurity:   . Worried About Charity fundraiser in the Last Year: Not on file  . Ran Out of Food in the Last Year: Not on file  Transportation Needs:   . Lack of Transportation (Medical): Not on file  . Lack of Transportation (Non-Medical): Not on file  Physical Activity:   . Days of Exercise per Week: Not on file  . Minutes  of Exercise per Session: Not on file  Stress:   . Feeling of Stress : Not on file  Social Connections:   . Frequency of Communication with Friends and Family: Not on file  . Frequency of Social Gatherings with Friends and Family: Not on file  . Attends Religious Services: Not on file  . Active Member of Clubs or Organizations: Not on file  . Attends Archivist Meetings: Not on file  . Marital Status: Not on file     Family History: The patient's family history includes Cancer in her father and paternal grandmother; Diabetes in her mother. There is  no history of Allergic rhinitis, Angioedema, Asthma, Eczema, Immunodeficiency, or Urticaria.  ROS:   Please see the history of present illness.    ROS  All other systems reviewed and negative.   EKGs/Labs/Other Studies Reviewed:    The following studies were reviewed today: PAP compliance download from [place]   EKG:  EKG is  ordered today.  The ekg ordered today demonstrates NSR with no ST changes  Recent Labs: 07/27/2019: TSH 0.90 08/17/2019: Hemoglobin 15.4; Platelets 302 12/22/2019: BUN 8; Creatinine, Ser 0.64; Potassium 4.9; Sodium 139 01/11/2020: ALT 36   Recent Lipid Panel    Component Value Date/Time   CHOL 201 (H) 07/27/2019 0943   TRIG 101.0 07/27/2019 0943   HDL 41.50 07/27/2019 0943   CHOLHDL 5 07/27/2019 0943   VLDL 20.2 07/27/2019 0943   LDLCALC 139 (H) 07/27/2019 0943   LDLDIRECT 155.0 01/13/2019 1208    Physical Exam:    VS:  Ht 5\' 2"  (1.575 m)   Wt 150 lb (68 kg)   BMI 27.44 kg/m     Wt Readings from Last 3 Encounters:  02/07/20 150 lb (68 kg)  01/11/20 150 lb 12.8 oz (68.4 kg)  11/09/19 150 lb 12.7 oz (68.4 kg)     ASSESSMENT:    1. OSA (obstructive sleep apnea)   2. Ascending aortic aneurysm ([place])   3. PAC (premature atrial contraction)    PLAN:    In order of problems listed above:  1.  OSA -The patient is tolerating PAP therapy well without any problems. The PAP download was  reviewed today and showed an AHI of 0.3/hr on auto CPAP with 73% compliance in using more than 4 hours nightly.  The patient has been using and benefiting from PAP use and will continue to benefit from therapy.   2.  Dilated ascending aorta -echo and Chest CTA 12/2018 showed ascending aorta dimension around 22mm. -cardiac MRI 01/2020 showed stable ascending aortic aneurysm at 4.1cm -BP was 122/4mmHg in August at her Allergist -her LDL was 139 in March 2021 -recommend starting statin -will repeat chest MRI/MRA in 1 year and if stable the 2D echo the next year -I have recommended that she get an Omron BP cuff and check her BP daly for a week and call with results -I have encouraged her to check her BP at least twice weekly after that with goal BP of <130/54mmHg  3.  PACs -these do not seem to be too bothersome  4.  HLD -LDL goal < 70 given aortic aneurysm -start Lipitor 20mg  daily -repeat FLP and ALT in 6 weeks  5.  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  DOE -2D echo has been normal -ETT was normal in 2018 -repeat ETT to rule out ischemia -coronary Ca score -if normal then refer to pulmonary    COVID-19 Education: The signs and symptoms of COVID-19 were discussed with the patient and how to seek care for testing (follow up with PCP or arrange E-visit).  The importance of social distancing was discussed today.  Patient Risk:   After full review of this patient's clinical status, I feel that they are at least moderate risk at this time.  Time:   Today, I have spent 25 minutes on telemedicine discussing medical problems including OSA, aortic atherosclerosis, HLD and reviewing patient's chart including Chest MRI, labs, PAP compliance download.   Medication Adjustments/Labs and Tests  Ordered: Current medicines are reviewed at length with the patient today.  Concerns regarding medicines  are outlined above.  No orders of the defined types were placed in this encounter.  No orders of the defined types were placed in this encounter.   Signed, Fransico Him, MD  02/07/2020 8:11 AM    [place]

## 2020-02-12 ENCOUNTER — Encounter: Payer: Self-pay | Admitting: Family Medicine

## 2020-02-12 DIAGNOSIS — H9190 Unspecified hearing loss, unspecified ear: Secondary | ICD-10-CM

## 2020-03-20 ENCOUNTER — Other Ambulatory Visit: Payer: BC Managed Care – PPO

## 2020-03-22 ENCOUNTER — Other Ambulatory Visit (HOSPITAL_COMMUNITY)
Admission: RE | Admit: 2020-03-22 | Discharge: 2020-03-22 | Disposition: A | Discharge status: Home / Self Care | Payer: BC Managed Care – PPO | Source: Ambulatory Visit | Attending: Cardiology | Admitting: Cardiology

## 2020-03-22 DIAGNOSIS — Z01812 Encounter for preprocedural laboratory examination: Secondary | ICD-10-CM | POA: Insufficient documentation

## 2020-03-22 DIAGNOSIS — Z20822 Contact with and (suspected) exposure to covid-19: Secondary | ICD-10-CM | POA: Diagnosis not present

## 2020-03-22 LAB — SARS CORONAVIRUS 2 (TAT 6-24 HRS): SARS Coronavirus 2: NEGATIVE

## 2020-03-26 ENCOUNTER — Ambulatory Visit (INDEPENDENT_AMBULATORY_CARE_PROVIDER_SITE_OTHER): Payer: BC Managed Care – PPO

## 2020-03-26 ENCOUNTER — Other Ambulatory Visit: Payer: BC Managed Care – PPO

## 2020-03-26 ENCOUNTER — Ambulatory Visit (INDEPENDENT_AMBULATORY_CARE_PROVIDER_SITE_OTHER)
Admission: RE | Admit: 2020-03-26 | Discharge: 2020-03-26 | Disposition: A | Discharge status: Home / Self Care | Payer: Self-pay | Source: Ambulatory Visit | Attending: Cardiology | Admitting: Cardiology

## 2020-03-26 ENCOUNTER — Other Ambulatory Visit: Payer: Self-pay

## 2020-03-26 DIAGNOSIS — R0602 Shortness of breath: Secondary | ICD-10-CM | POA: Diagnosis not present

## 2020-03-26 DIAGNOSIS — E7800 Pure hypercholesterolemia, unspecified: Secondary | ICD-10-CM | POA: Diagnosis not present

## 2020-03-26 LAB — ALT: ALT: 44 IU/L — ABNORMAL HIGH (ref 0–32)

## 2020-03-26 LAB — LIPID PANEL
Chol/HDL Ratio: 4.2 ratio (ref 0.0–4.4)
Cholesterol, Total: 160 mg/dL (ref 100–199)
HDL: 38 mg/dL — ABNORMAL LOW (ref >39)
LDL Chol Calc (NIH): 90 mg/dL (ref 0–99)
Triglycerides: 187 mg/dL — ABNORMAL HIGH (ref 0–149)
VLDL Cholesterol Cal: 32 mg/dL (ref 5–40)

## 2020-03-26 LAB — EXERCISE TOLERANCE TEST
Estimated workload: 13.4 METS
Exercise duration (min): 10 min
Exercise duration (sec): 0 s
MPHR: 171 {beats}/min
Peak HR: 148 {beats}/min
Percent HR: 86 %
RPE: 19
Rest HR: 70 {beats}/min

## 2020-03-28 ENCOUNTER — Telehealth: Payer: Self-pay

## 2020-03-28 DIAGNOSIS — I712 Thoracic aortic aneurysm, without rupture, unspecified: Secondary | ICD-10-CM

## 2020-03-28 DIAGNOSIS — R0602 Shortness of breath: Secondary | ICD-10-CM

## 2020-03-28 NOTE — Telephone Encounter (Signed)
Ann Margarita, MD  03/26/2020 11:18 PM EST     Please refer to pulmonary for evaluation of SOB   Spoke with the patient in regards to stress test and CT results.  Referral to pulmonary has been placed.  Order for chest CT has been placed.  She has already spoken to her PCP and they have made her an appointment to discuss results.

## 2020-03-28 NOTE — Telephone Encounter (Signed)
-----   Message from Sueanne Margarita, MD sent at 03/28/2020  1:29 PM EST ----- Chest CT showed severe fatty liver and no coronary calcium.  Ascending aorta was mildly enlarged.  Please forward to PCP for workup of fatty liver.  Also please get a dedicated chest CTA to further assess aortic enlargement

## 2020-04-01 ENCOUNTER — Telehealth: Payer: Self-pay

## 2020-04-01 ENCOUNTER — Encounter: Payer: Self-pay | Admitting: Surgery

## 2020-04-01 ENCOUNTER — Ambulatory Visit (INDEPENDENT_AMBULATORY_CARE_PROVIDER_SITE_OTHER): Payer: BC Managed Care – PPO | Admitting: Surgery

## 2020-04-01 ENCOUNTER — Other Ambulatory Visit: Payer: Self-pay

## 2020-04-01 VITALS — BP 124/83 | HR 71 | Temp 98.0°F | Resp 20 | Ht 62.0 in | Wt 153.0 lb

## 2020-04-01 DIAGNOSIS — I728 Aneurysm of other specified arteries: Secondary | ICD-10-CM

## 2020-04-01 DIAGNOSIS — K760 Fatty (change of) liver, not elsewhere classified: Secondary | ICD-10-CM

## 2020-04-01 NOTE — Addendum Note (Signed)
Addended by: Leonidas Romberg on: 04/01/2020 04:17 PM   Modules accepted: Orders

## 2020-04-01 NOTE — Telephone Encounter (Signed)
lmomed the pt to schedule appt w/lipid clinic pharmd

## 2020-04-01 NOTE — Progress Notes (Signed)
[facility] [employer]  Patient name: Ann Bass MRN: [id number] DOB: Feb 02, 1971 Sex: female   REQUESTING PROVIDER:    Dr. Radford Pax   REASON FOR CONSULT:    Splenic aneurysm  HISTORY OF PRESENT ILLNESS:   Ann Bass is a 49 y.o. 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  Maximum diameter is 15 mm.  It has been unchanged over the course of 2 years.  She denies any left upper quadrant pain.  She has 3 children with no plans for any additional children.  The patient is on a statin for hypercholesterolemia.  Maximum ascending aortic diameter is 4.1 cm.  PAST MEDICAL HISTORY    Past Medical History:  Diagnosis Date  . Allergy   . Depression    post-partum  . Dilated aortic root ([place])    42mm by chest MRI 01/2020  . Dyspnea   . Dysrhythmia    occational  . History of gestational diabetes   . Inflammatory polyps of colon ([place])   . OSA (obstructive sleep apnea) 09/02/2017   Mild OSA with an AHI of 6.8/h.  On CPAP  . Palpitations    PACs noted on event monitor  . Pre-diabetes    updated epic hx 08-23-2019 per pt she is pre-diabetic     FAMILY HISTORY   Family History  Problem Relation Age of Onset  . Diabetes Mother   . Cancer Father        colon  . Cancer Paternal Grandmother        ovarian  . Allergic rhinitis Neg Hx   . Angioedema Neg Hx   . Asthma Neg Hx   . Eczema Neg Hx   . Immunodeficiency Neg Hx   . Urticaria Neg Hx     SOCIAL HISTORY:   Social History   Socioeconomic History  . Marital status: Married    Spouse name: Not on file  . Number of children: Not on file  . Years of education: Not on file  . Highest education level: Not on file  Occupational History  . Not on file  Tobacco Use  . Smoking status: Never Smoker  . Smokeless tobacco: Never Used  Vaping  Use  . Vaping Use: Never used  Substance and Sexual Activity  . Alcohol use: Yes    Comment: rare  . Drug use: No  . Sexual activity: Yes  Other Topics Concern  . Not on file  Social History Narrative  . Not on file   Social Determinants of Health   Financial Resource Strain:   . Difficulty of Paying Living Expenses: Not on file  Food Insecurity:   . Worried About Charity fundraiser in the Last Year: Not on file  . Ran Out of Food in the Last Year: Not on file  Transportation Needs:   . Lack of Transportation (Medical): Not on file  . Lack of Transportation (Non-Medical): Not on file  Physical Activity:   . Days of Exercise per Week: Not on file  . Minutes of Exercise per Session: Not on file  Stress:   . Feeling of Stress : Not on file  Social Connections:   . Frequency of Communication with Friends and Family: Not on file  . Frequency of Social Gatherings with Friends and Family: Not on file  . Attends Religious Services: Not on  file  . Active Member of Clubs or Organizations: Not on file  . Attends Archivist Meetings: Not on file  . Marital Status: Not on file  Intimate Partner Violence:   . Fear of Current or Ex-Partner: Not on file  . Emotionally Abused: Not on file  . Physically Abused: Not on file  . Sexually Abused: Not on file    ALLERGIES:    Allergies  Allergen Reactions  . Penicillins Rash    CURRENT MEDICATIONS:    Current Outpatient Medications  Medication Sig Dispense Refill  . ARMOUR THYROID 15 MG tablet TAKE 1 TABLET ON MONDAY AND THURSDAY (WITH 60MG  TABLET FOR 75MG  TOTALDOSE) 24 tablet 1  . ARMOUR THYROID 60 MG tablet TAKE 1 TABLET (60 MG TOTAL) BY MOUTH DAILY. AS DIRECTED 90 tablet 1  . Ascorbic Acid (VITAMIN C) 1000 MG tablet Take 1,000 mg by mouth daily.    Marland Kitchen atorvastatin (LIPITOR) 20 MG tablet Take 1 tablet (20 mg total) by mouth daily. 90 tablet 3  . cholecalciferol (VITAMIN D3) 25 MCG (1000 UNIT) tablet Take 1,000 Units by  mouth daily.    . citalopram (CELEXA) 20 MG tablet TAKE 1 TABLET BY MOUTH EVERY DAY 90 tablet 0  . fenofibrate 160 MG tablet Take 1 tablet (160 mg total) by mouth daily. 90 tablet 1  . fexofenadine (ALLEGRA) 30 MG/5ML suspension Take 30 mg by mouth daily.    . Multiple Vitamin (MULTIVITAMIN) tablet Take 1 tablet by mouth daily.    . Omega-3 Fatty Acids (FISH OIL) 1000 MG CAPS Take 1,000 mg by mouth daily.     Marland Kitchen levocetirizine (XYZAL) 5 MG tablet Take 1 tablet (5 mg total) by mouth every evening. 30 tablet 5   No current facility-administered medications for this visit.    REVIEW OF SYSTEMS:   [X]  denotes positive finding, [ ]  denotes negative finding Cardiac  Comments:  Chest pain or chest pressure:    Shortness of breath upon exertion: x   Short of breath when lying flat:    Irregular heart rhythm: x       Vascular    Pain in calf, thigh, or hip brought on by ambulation:    Pain in feet at night that wakes you up from your sleep:     Blood clot in your veins:    Leg swelling:         Pulmonary    Oxygen at home:    Productive cough:     Wheezing:         Neurologic    Sudden weakness in arms or legs:     Sudden numbness in arms or legs:     Sudden onset of difficulty speaking or slurred speech:    Temporary loss of vision in one eye:     Problems with dizziness:         Gastrointestinal    Blood in stool:      Vomited blood:         Genitourinary    Burning when urinating:     Blood in urine:        Psychiatric    Major depression:         Hematologic    Bleeding problems:    Problems with blood clotting too easily:        Skin    Rashes or ulcers:        Constitutional    Fever or chills:  PHYSICAL EXAM:   There were no vitals filed for this visit.  GENERAL: The patient is a well-nourished female, in no acute distress. The vital signs are documented above. CARDIAC: There is a regular rate and rhythm.  VASCULAR: Palpable pedal pulses PULMONARY:  Nonlabored respirations ABDOMEN: Soft and non-tender .  MUSCULOSKELETAL: There are no major deformities or cyanosis. NEUROLOGIC: No focal weakness or paresthesias are detected. SKIN: There are no ulcers or rashes noted. PSYCHIATRIC: The patient has a normal affect.  STUDIES:   I have reviewed the following studies: MRI chest: Unchanged diameter of the ascending aorta, estimated 4.1 cm on the current MR. Recommend annual imaging followup by CTA or MRA. This recommendation follows 2010 ACCF/AHA/AATS/ACR/ASA/SCA/SCAI/SIR/STS/SVM Guidelines for the Diagnosis and Management of Patients with Thoracic Aortic Disease. Circulation. 2010; 121: X528-U132  Incidental imaging of splenic artery aneurysm, estimated less than 15 mm. The appearance is unchanged compared to the prior CT and MRI. Given the patient's age and size, referral for vascular evaluation recommended. ASSESSMENT and PLAN   Splenic aneurysm: Maximum diameter is 15 mm.  This is unchanged over the past 2 years.  The entire splenic artery is not visualized on the CT scan.  I discussed with the patient that the aneurysm is not calcified and remained stable.  There is no indication for intervention at this time.  I will continue to monitor this.  We discussed intervention if it becomes greater than 2 cm.  I will see her after her next CT scan for her ascending aorta and 2 years.   Leia Alf, MD, FACS [facility] [phone] Pager [phone]

## 2020-04-03 ENCOUNTER — Telehealth: Payer: Self-pay

## 2020-04-03 DIAGNOSIS — E7800 Pure hypercholesterolemia, unspecified: Secondary | ICD-10-CM

## 2020-04-03 MED ORDER — ROSUVASTATIN CALCIUM 20 MG PO TABS: 20.0000 mg | ORAL_TABLET | Freq: Every day | ORAL | 3 refills | Status: DC

## 2020-04-03 NOTE — Telephone Encounter (Signed)
-----   Message from Leeroy Bock, [place] sent at 04/03/2020  9:08 AM EST ----- TG slightly elevated but would not add new med for this - encourage limiting carbs, sugar and alcohol in her diet. Her LDL has improved from 139 to 90 since starting atorvastatin 20mg  (still a bit elevated above goal < 70 due to aortic aneurysm per Dr Radford Pax). LFTs mildly elevated in setting of fatty liver disease but overall stable since starting statin therapy. Would recommend changing atorvastatin to rosuvastatin 20mg  daily (less likely to affect LFTs and slightly higher potency to bring LDL closer to goal). Continue fenofibrate. Recheck lipids and LFTs in 2 months - may need further dose titration of rosuvastatin to 40mg  however would recommend slower titration due to fatty liver disease.

## 2020-04-03 NOTE — Telephone Encounter (Signed)
The patient has been notified of the result and verbalized understanding.  All questions (if any) were answered. Ann Iba, RN 04/03/2020 9:27 AM  Rx sent in for rosuvastatin 20 mg daily. Patient scheduled for repeat labs in two months

## 2020-04-17 ENCOUNTER — Other Ambulatory Visit: Payer: Self-pay

## 2020-04-17 ENCOUNTER — Encounter: Payer: Self-pay | Admitting: Family Medicine

## 2020-04-17 ENCOUNTER — Ambulatory Visit (INDEPENDENT_AMBULATORY_CARE_PROVIDER_SITE_OTHER): Payer: BC Managed Care – PPO | Admitting: Family Medicine

## 2020-04-17 VITALS — BP 126/74 | HR 76 | Temp 98.1°F | Resp 16 | Ht 62.0 in | Wt 153.8 lb

## 2020-04-17 DIAGNOSIS — Z23 Encounter for immunization: Secondary | ICD-10-CM

## 2020-04-17 DIAGNOSIS — K760 Fatty (change of) liver, not elsewhere classified: Secondary | ICD-10-CM

## 2020-04-17 NOTE — Progress Notes (Signed)
   Subjective:    Patient ID: Ann Bass, female    DOB: 05/04/1971, 49 y.o.   MRN: [id number]  HPI Discuss imaging results- CT scan done on 11/9 indicated severe hepatic steatosis.  She was told to f/u here by Dr Radford Pax due to the results.  We made hepatology referral on 11/15 and she is seeing them next week.  Denies abd pain, N/V.  No regular exercise.  Was doing well on keto but recently has had trouble cutting her carbs.  'it's definitely a sugar thing'.    Review of Systems For ROS see HPI   This visit occurred during the SARS-CoV-2 public health emergency.  Safety protocols were in place, including screening questions prior to the visit, additional usage of staff PPE, and extensive cleaning of exam room while observing appropriate contact time as indicated for disinfecting solutions.       Objective:   Physical Exam Vitals reviewed.  Constitutional:      General: She is not in acute distress.    Appearance: Normal appearance. She is not ill-appearing.  HENT:     Head: Normocephalic and atraumatic.  Eyes:     General: No scleral icterus.    Extraocular Movements: Extraocular movements intact.     Pupils: Pupils are equal, round, and reactive to light.  Skin:    General: Skin is warm and dry.     Coloration: Skin is not jaundiced.  Neurological:     General: No focal deficit present.     Mental Status: She is alert and oriented to person, place, and time.  Psychiatric:        Mood and Affect: Mood normal.        Behavior: Behavior normal.        Thought Content: Thought content normal.           Assessment & Plan:  Fatty liver- new.  Noted on CT scan done by [facility].  We have already placed referral to liver specialist.  Discussed need for low carb diet, no alcohol.  She is aware that low carb diet is important but admits she has a hard time w/ this.  Discussed different strategies to make small changes.  Will follow.

## 2020-04-17 NOTE — Patient Instructions (Signed)
Follow up as needed or as scheduled See the liver specialist as scheduled Try and limit your carb intake and get regular exercise It's ok to trick the brain- substitute sweets for fruit, sweet tea for 1/2 and 1/2, etc Call with any questions or concerns Stay Safe!  Stay Healthy! Happy Holidays!

## 2020-04-25 ENCOUNTER — Encounter: Payer: Self-pay | Admitting: Internal Medicine

## 2020-04-25 ENCOUNTER — Ambulatory Visit (INDEPENDENT_AMBULATORY_CARE_PROVIDER_SITE_OTHER): Payer: BC Managed Care – PPO | Admitting: Internal Medicine

## 2020-04-25 ENCOUNTER — Other Ambulatory Visit: Payer: Self-pay

## 2020-04-25 VITALS — BP 142/98 | HR 98 | Temp 97.8°F | Ht 62.0 in | Wt 152.6 lb

## 2020-04-25 DIAGNOSIS — R0602 Shortness of breath: Secondary | ICD-10-CM | POA: Diagnosis not present

## 2020-04-25 MED ORDER — ALBUTEROL SULFATE HFA 108 (90 BASE) MCG/ACT IN AERS: 2.0000 | INHALATION_SPRAY | Freq: Four times a day (QID) | RESPIRATORY_TRACT | 5 refills | Status: DC | PRN

## 2020-04-25 NOTE — Progress Notes (Signed)
Ann Bass    [id number]    Dec 17, 1970  Primary Care Physician:Tabori, Aundra Millet, MD  Referring Physician: Sueanne Margarita, MD [phone] [place],  [place] [address] Reason for Consultation: shortness of breath Date of Consultation: 04/25/2020  Chief complaint:   Chief Complaint  Patient presents with  . Consult    Shortness of breath with little activity, has been seen by cardiologist, referred by Dr. Radford Pax.     HPI: Ann Bass is a 49 y.o. woman with progressive shortness of breath with minimal activity for about 10 years.  No childhood dyspnea, bronchitis, allergies, asthma.  Walking across the house will make her be short of breath, including ADLs around the house. She does have palpitations occasionally associated. Improved with rest. Has a hard time getting a deep breath in.  Takes 3-5 minutes to recover with her breathing. No chest tightness, wheezing, cough.   She does have seasonal allergies (spring, ragweed,) takes allegra year round, and takes flonase as needed.    She does have OSA and is on CPAP for the past 2 years.  She has fatty liver since 2019 and is following with a hepatologist at [facility].   Talking fast for longer periods of time can bring on the symptoms. Not usually symptoms at rest unless she has a palpitation.   Social history:  Occupation: stay at home mom.  Exposures: lives at home with husband and three daughters.  Smoking history: never smoker. No passive smoke exposure.   Social History   Occupational History  . Not on file  Tobacco Use  . Smoking status: Never Smoker  . Smokeless tobacco: Never Used  Vaping Use  . Vaping Use: Never used  Substance and Sexual Activity  . Alcohol use: Yes    Comment: rare  . Drug use: No  . Sexual activity: Yes    Relevant family history:  Family History  Problem Relation Age of Onset  . Diabetes Mother   . Cancer Father        colon  . Cancer Paternal  Grandmother        ovarian  . Allergic rhinitis Neg Hx   . Angioedema Neg Hx   . Asthma Neg Hx   . Eczema Neg Hx   . Immunodeficiency Neg Hx   . Urticaria Neg Hx     Past Medical History:  Diagnosis Date  . Allergy   . Depression    post-partum  . Dilated aortic root ([facility])    17mm by chest MRI 01/2020  . Dyspnea   . Dysrhythmia    occational  . History of gestational diabetes   . Hyperlipidemia   . Hypertension   . Hypothyroidism   . Inflammatory polyps of colon ([facility])   . Liver disease   . OSA (obstructive sleep apnea) 09/02/2017   Mild OSA with an AHI of 6.8/h.  On CPAP  . Palpitations    PACs noted on event monitor  . Pre-diabetes    updated epic hx 08-23-2019 per pt she is pre-diabetic    Past Surgical History:  Procedure Laterality Date  . broken leg  1982   Left Femur, traction and pin in leg  . COLONOSCOPY  2018  . ROBOTIC ASSISTED LAPAROSCOPIC HYSTERECTOMY AND SALPINGECTOMY Bilateral 08/24/2019   Procedure: XI ROBOTIC ASSISTED LAPAROSCOPIC HYSTERECTOMY AND SALPINGECTOMY;  Surgeon: Delsa Bern, MD;  Location: [facility];  Service: Gynecology;  Laterality:  Bilateral;  . WISDOM TOOTH EXTRACTION  1998     Physical Exam: Blood pressure (!) 142/98, pulse 98, temperature 97.8 F (36.6 C), temperature source Temporal, height 5\' 2"  (1.575 m), weight 152 lb 9.6 oz (69.2 kg), SpO2 97 %. Gen:      No acute distress ENT:  no nasal polyps, mucus membranes moist Lungs:    No increased respiratory effort, symmetric chest wall excursion, clear to auscultation bilaterally, no wheezes or crackles CV:         Regular rate and rhythm; no murmurs, rubs, or gallops.  No pedal edema Abd:      + bowel sounds; soft, non-tender; no distension MSK: no acute synovitis of DIP or PIP joints, no mechanics hands.  Skin:      Warm and dry; no rashes Neuro: normal speech, no focal facial asymmetry Psych: alert and oriented x3, normal mood and affect   Data  Reviewed/Medical Decision Making:  Independent interpretation of tests: Imaging: . Review of patient's CT coronary calcium lung windows images revealed normal lung windows.. The patient's images have been independently reviewed by me.    PFTs:  Labs:  Lab Results  Component Value Date   NA 139 12/22/2019   K 4.9 12/22/2019   CL 101 12/22/2019   CO2 22 12/22/2019   Lab Results  Component Value Date   WBC 6.0 08/17/2019   HGB 15.4 (H) 08/17/2019   HCT 47.1 (H) 08/17/2019   MCV 90.1 08/17/2019   PLT 302 08/17/2019     Immunization status:  Immunization History  Administered Date(s) Administered  . Influenza, Quadrivalent, Recombinant, Inj, Pf 04/21/2018  . Influenza,inj,Quad PF,6+ Mos 05/25/2014, 01/13/2019, 04/17/2020  . PFIZER SARS-COV-2 Vaccination 10/24/2019, 11/14/2019  . Tdap 09/13/2014    . I reviewed prior external note(s) from cardiology . I reviewed the result(s) of the labs and imaging as noted above.  . I have ordered PFTs  Assessment:  Shortness of breath  Plan/Recommendations: Differential diagnosis includes asthma, restriction to ventilation, laryngeal obstruction.  Will proceed with full set of PFTs and a trial of albuterol prn. She will let me know how this is affecting her symptoms. I will see her back after PFTs. She is following with hepatology regarding her hepatic steatosis. I have cautioned her about potential worsening of palpitations with albuterol.    Return to Care: Return in about 5 weeks (around 05/30/2020).  [name], MD Pulmonary and [facility]  CC: Sueanne Margarita, MD

## 2020-04-25 NOTE — Patient Instructions (Signed)
The patient should have follow up scheduled with myself in 4-6 weeks.   Prior to next visit patient should have: Full set of PFTs  Take the albuterol rescue inhaler every 4 to 6 hours as needed for wheezing or shortness of breath. You can also take it 15 minutes before exercise or exertional activity. Side effects include heart racing or pounding, jitters or anxiety. If you have a history of an irregular heart rhythm, it can make this worse. Can also give some patients a hard time sleeping.  To inhale the aerosol using an inhaler, follow these steps:  1. Remove the protective dust cap from the end of the mouthpiece. If the dust cap was not placed on the mouthpiece, check the mouthpiece for dirt or other objects. Be sure that the canister is fully and firmly inserted in the mouthpiece. 2. If you are using the inhaler for the first time or if you have not used the inhaler in more than 14 days, you will need to prime it. You may also need to prime the inhaler if it has been dropped. Ask your pharmacist or check the manufacturer's information if this happens. To prime the inhaler, shake it well and then press down on the canister 4 times to release 4 sprays into the air, away from your face. Be careful not to get albuterol in your eyes. 3. Shake the inhaler well. 4. Breathe out as completely as possible through your mouth. 4. Hold the canister with the mouthpiece on the bottom, facing you and the canister pointing upward. Place the open end of the mouthpiece into your mouth. Close your lips tightly around the mouthpiece. 6. Breathe in slowly and deeply through the mouthpiece.At the same time, press down once on the container to spray the medication into your mouth. 7. Try to hold your breath for 10 seconds. remove the inhaler, and breathe out slowly. 8. If you were told to use 2 puffs, wait 1 minute and then repeat steps 3-7. 9. Replace the protective cap on the inhaler. 10. Clean your inhaler  regularly. Follow the manufacturer's directions carefully and ask your doctor or pharmacist if you have any questions about cleaning your inhaler.  Check the back of the inhaler to keep track of the total number of doses left on the inhaler.

## 2020-05-05 ENCOUNTER — Other Ambulatory Visit: Payer: Self-pay | Admitting: Family Medicine

## 2020-05-23 ENCOUNTER — Other Ambulatory Visit: Payer: Self-pay | Admitting: Family Medicine

## 2020-05-29 ENCOUNTER — Other Ambulatory Visit: Payer: Self-pay | Admitting: *Deleted

## 2020-05-29 DIAGNOSIS — R0602 Shortness of breath: Secondary | ICD-10-CM

## 2020-05-30 ENCOUNTER — Other Ambulatory Visit: Payer: Self-pay

## 2020-05-30 ENCOUNTER — Ambulatory Visit (INDEPENDENT_AMBULATORY_CARE_PROVIDER_SITE_OTHER): Payer: 59 | Admitting: Internal Medicine

## 2020-05-30 ENCOUNTER — Encounter: Payer: Self-pay | Admitting: Internal Medicine

## 2020-05-30 VITALS — BP 118/82 | HR 68 | Temp 97.3°F | Ht 62.0 in | Wt 153.0 lb

## 2020-05-30 DIAGNOSIS — R0602 Shortness of breath: Secondary | ICD-10-CM | POA: Diagnosis not present

## 2020-05-30 DIAGNOSIS — R0600 Dyspnea, unspecified: Secondary | ICD-10-CM | POA: Diagnosis not present

## 2020-05-30 DIAGNOSIS — R0609 Other forms of dyspnea: Secondary | ICD-10-CM

## 2020-05-30 LAB — PULMONARY FUNCTION TEST
DL/VA % pred: 116 %
DL/VA: 5.3 ml/min/mmHg/L
DLCO cor % pred: 112 %
DLCO cor: 24.22 ml/min/mmHg
DLCO unc % pred: 112 %
DLCO unc: 24.22 ml/min/mmHg
FEF 25-75 Post: 2.72 L/sec
FEF 25-75 Pre: 3.81 L/sec
FEF2575-%Change-Post: -28 %
FEF2575-%Pred-Post: 108 %
FEF2575-%Pred-Pre: 151 %
FEV1-%Change-Post: -1 %
FEV1-%Pred-Post: 116 %
FEV1-%Pred-Pre: 118 %
FEV1-Post: 2.83 L
FEV1-Pre: 2.89 L
FEV1FVC-%Change-Post: -4 %
FEV1FVC-%Pred-Pre: 106 %
FEV6-%Change-Post: 2 %
FEV6-Post: 3.41 L
FEV6-Pre: 3.33 L
FVC-%Change-Post: 2 %
FVC-%Pred-Post: 114 %
FVC-%Pred-Pre: 111 %
FVC-Post: 3.41 L
FVC-Pre: 3.33 L
Post FEV1/FVC ratio: 83 %
Post FEV6/FVC ratio: 100 %
Pre FEV1/FVC ratio: 87 %
Pre FEV6/FVC Ratio: 100 %
RV % pred: 105 %
RV: 1.77 L
TLC % pred: 107 %
TLC: 5.12 L

## 2020-05-30 NOTE — Patient Instructions (Signed)
Follow up after Cardiopulmonary exercise test.

## 2020-05-30 NOTE — Progress Notes (Signed)
Full PFT performed today. °

## 2020-05-30 NOTE — Progress Notes (Signed)
Ann Bass    [id number]    1971/02/17  Primary Care Physician:Tabori, Aundra Millet, MD Date of Appointment: 05/30/2020 Established Patient Visit  Chief complaint:   Chief Complaint  Patient presents with  . Follow-up    F/U after PFT. States her breathing has been stable since last visit. Denies any new symptoms.      HPI: Ann Bass is a 50 y.o. woman with history of seasonal allergies and OSA who presented in December 2021 with shortness of breath.  Interval Updates: She is here for follow up of her dyspnea. She tried albuterol inhaler for dyspnea and did not have any benefit. Took it 3-4 times either before exercise or when having symptoms. Here for follow up after PFTs. Symptoms of dyspnea are always with exertion, never with rest.  Still having episodes of palpitations 1-2 times/month which is not associated with exertion.   I have reviewed the patient's family social and past medical history and updated as appropriate.   Past Medical History:  Diagnosis Date  . Allergy   . Depression    post-partum  . Dilated aortic root ([facility])    54mm by chest MRI 01/2020  . Dyspnea   . Dysrhythmia    occational  . History of gestational diabetes   . Hyperlipidemia   . Hypertension   . Hypothyroidism   . Inflammatory polyps of colon ([facility])   . Liver disease   . OSA (obstructive sleep apnea) 09/02/2017   Mild OSA with an AHI of 6.8/h.  On CPAP  . Palpitations    PACs noted on event monitor  . Pre-diabetes    updated epic hx 08-23-2019 per pt she is pre-diabetic    Past Surgical History:  Procedure Laterality Date  . broken leg  1982   Left Femur, traction and pin in leg  . COLONOSCOPY  2018  . ROBOTIC ASSISTED LAPAROSCOPIC HYSTERECTOMY AND SALPINGECTOMY Bilateral 08/24/2019   Procedure: XI ROBOTIC ASSISTED LAPAROSCOPIC HYSTERECTOMY AND SALPINGECTOMY;  Surgeon: Delsa Bern, MD;  Location: [facility];  Service: Gynecology;  Laterality:  Bilateral;  . WISDOM TOOTH EXTRACTION  1998    Family History  Problem Relation Age of Onset  . Diabetes Mother   . Cancer Father        colon  . Cancer Paternal Grandmother        ovarian  . Allergic rhinitis Neg Hx   . Angioedema Neg Hx   . Asthma Neg Hx   . Eczema Neg Hx   . Immunodeficiency Neg Hx   . Urticaria Neg Hx     Social History   Occupational History  . Not on file  Tobacco Use  . Smoking status: Never Smoker  . Smokeless tobacco: Never Used  Vaping Use  . Vaping Use: Never used  Substance and Sexual Activity  . Alcohol use: Yes    Comment: rare  . Drug use: No  . Sexual activity: Yes     Physical Exam: Blood pressure 118/82, pulse 68, temperature (!) 97.3 F (36.3 C), temperature source Temporal, height 5\' 2"  (1.575 m), weight 153 lb (69.4 kg), SpO2 98 %.  Gen:      No acute distress Lungs:    No increased respiratory effort, symmetric chest wall excursion, clear to auscultation bilaterally, no wheezes or crackles CV:         Regular rate and rhythm; no murmurs, rubs, or gallops.  No pedal edema  Data Reviewed: Imaging: I have personally reviewed the CT Cardiac in lung windows which shows no acute process. CT Angio august 2020 with bibasilar   PFTs:  PFT Results Latest Ref Rng & Units 05/30/2020  FVC-Pre L 3.33  FVC-Predicted Pre % 111  FVC-Post L 3.41  FVC-Predicted Post % 114  Pre FEV1/FVC % % 87  Post FEV1/FCV % % 83  FEV1-Pre L 2.89  FEV1-Predicted Pre % 118  FEV1-Post L 2.83  DLCO uncorrected ml/min/mmHg 24.22  DLCO UNC% % 112  DLCO corrected ml/min/mmHg 24.22  DLCO COR %Predicted % 112  DLVA Predicted % 116  TLC L 5.12  TLC % Predicted % 107  RV % Predicted % 105   I have personally reviewed the patient's PFTs and they are normal.   Labs:  Immunization status: Immunization History  Administered Date(s) Administered  . Influenza, Quadrivalent, Recombinant, Inj, Pf 04/21/2018  . Influenza,inj,Quad PF,6+ Mos 05/25/2014,  01/13/2019, 04/17/2020  . PFIZER SARS-COV-2 Vaccination 10/24/2019, 11/14/2019  . Tdap 09/13/2014    Assessment:  Shortness of breath  Plan/Recommendations: Unclear etiology of her symptoms. She had an treadmill stress test in November 2020 which was normal response to exercise. Her spirometry is normal and asthma is much lower on my differential given no improvement with albuterol either in person or on spirometry. At this point the test that makes the most sense to me is a CPET for further evaluation of her dyspnea.  Will reach out to Dr. Radford Pax to see if she has any other thoughts.     Return to Care: I will see her back after CPET.   [name], MD Pulmonary and [facility]

## 2020-06-06 ENCOUNTER — Other Ambulatory Visit: Payer: 59 | Admitting: *Deleted

## 2020-06-06 ENCOUNTER — Other Ambulatory Visit: Payer: Self-pay

## 2020-06-06 DIAGNOSIS — E7800 Pure hypercholesterolemia, unspecified: Secondary | ICD-10-CM

## 2020-06-06 LAB — LIPID PANEL
Chol/HDL Ratio: 3.5 ratio (ref 0.0–4.4)
Cholesterol, Total: 139 mg/dL (ref 100–199)
HDL: 40 mg/dL (ref >39)
LDL Chol Calc (NIH): 77 mg/dL (ref 0–99)
Triglycerides: 119 mg/dL (ref 0–149)
VLDL Cholesterol Cal: 22 mg/dL (ref 5–40)

## 2020-06-06 LAB — ALT: ALT: 76 IU/L — ABNORMAL HIGH (ref 0–32)

## 2020-06-07 ENCOUNTER — Telehealth: Payer: Self-pay

## 2020-06-07 DIAGNOSIS — E7800 Pure hypercholesterolemia, unspecified: Secondary | ICD-10-CM

## 2020-06-07 NOTE — Telephone Encounter (Signed)
Ann Margarita, MD  06/07/2020 9:19 AM EST      Lipids have improved but now ALT is elevated - please set up appt with lipid clinic ASAP    Left message for patient with results (ok per DPR). Referral for lipid clinic has been placed. Advised to call back to schedule appointment.

## 2020-06-13 ENCOUNTER — Other Ambulatory Visit: Payer: Self-pay

## 2020-06-13 ENCOUNTER — Encounter: Payer: Self-pay | Admitting: Allergy & Immunology

## 2020-06-13 LAB — HM MAMMOGRAPHY

## 2020-06-14 ENCOUNTER — Other Ambulatory Visit: Payer: Self-pay

## 2020-06-14 MED ORDER — TRIAMCINOLONE ACETONIDE 0.1 % EX OINT: 1.0000 "application " | TOPICAL_OINTMENT | Freq: Two times a day (BID) | CUTANEOUS | 1 refills | Status: AC

## 2020-06-14 NOTE — Telephone Encounter (Signed)
Refill sent in and I spoke to patient to inform her that medication was sent in

## 2020-06-21 ENCOUNTER — Encounter: Payer: Self-pay | Admitting: Pharmacist

## 2020-06-21 ENCOUNTER — Other Ambulatory Visit: Payer: Self-pay

## 2020-06-21 ENCOUNTER — Ambulatory Visit (INDEPENDENT_AMBULATORY_CARE_PROVIDER_SITE_OTHER): Payer: 59 | Admitting: Pharmacist

## 2020-06-21 DIAGNOSIS — K760 Fatty (change of) liver, not elsewhere classified: Secondary | ICD-10-CM

## 2020-06-21 DIAGNOSIS — E7800 Pure hypercholesterolemia, unspecified: Secondary | ICD-10-CM | POA: Diagnosis not present

## 2020-06-21 MED ORDER — FENOFIBRATE 160 MG PO TABS: 160.0000 mg | ORAL_TABLET | Freq: Every day | ORAL | 1 refills | Status: DC

## 2020-06-21 NOTE — Progress Notes (Signed)
Patient ID: Ann Bass                 DOB: [birth year]                    MRN: [id number]     HPI: Ann Bass is a 50 y.o. female patient referred to lipid clinic by Dr Radford Pax. PMH is significant for OSA, glucose intolerance, and an ascending aortic aneurysm.  Patient had fasting lipid panel on 11/17 which showed an improvement in LDL from 139 to 90 while on atorvastatin, however LFTs elevated.  Atorvastatin was discontinued and rosuvastatin 20mg  was started and patient was to continue fenofibrate.  Patient had repeat lipid panel on 06/06/20 which showed a further decrease in LDL to 77, however ALT now increased to 76.  Patient was referred to lipid clinic.   Patient presents today in good spirits.  Reports she does not drink alcohol but does eat sweets.  During med rec, patient reports she has been taking rosuvastatin AND atorvastatin and had discontinued fenofibrate.  She believes she must have misunderstood the directions given to her.    Current Medications: atorvastatin 20mg  daily, rosuvastatin 20 mg daily Intolerances: n/a Risk Factors: aortic aneurysm LDL goal: <70  Labs: ALT 76 (06/06/20), ALT 44 (03/26/20)  TC 139, HDL 40, Trigs 119, LDL 77 (06/07/19 on atorvastatin and rosuvastatin)  Past Medical History:  Diagnosis Date  . Allergy   . Depression    post-partum  . Dilated aortic root ([facility])    36mm by chest MRI 01/2020  . Dyspnea   . Dysrhythmia    occational  . History of gestational diabetes   . Hyperlipidemia   . Hypertension   . Hypothyroidism   . Inflammatory polyps of colon ([facility])   . Liver disease   . OSA (obstructive sleep apnea) 09/02/2017   Mild OSA with an AHI of 6.8/h.  On CPAP  . Palpitations    PACs noted on event monitor  . Pre-diabetes    updated epic hx 08-23-2019 per pt she is pre-diabetic    Current Outpatient Medications on File Prior to Visit  Medication Sig Dispense Refill  . albuterol (VENTOLIN HFA) 108 (90 Base) MCG/ACT inhaler Inhale 2  puffs into the lungs every 6 (six) hours as needed. 18 g 5  . ARMOUR THYROID 15 MG tablet TAKE 1 TABLET ON MONDAY AND THURSDAY (WITH 60MG  TABLET FOR 75MG  TOTALDOSE) 24 tablet 1  . ARMOUR THYROID 60 MG tablet TAKE 1 TABLET (60 MG TOTAL) BY MOUTH DAILY. AS DIRECTED 90 tablet 1  . Ascorbic Acid (VITAMIN C) 1000 MG tablet Take 1,000 mg by mouth daily.    . cholecalciferol (VITAMIN D3) 25 MCG (1000 UNIT) tablet Take 1,000 Units by mouth daily.    . citalopram (CELEXA) 20 MG tablet TAKE 1 TABLET BY MOUTH EVERY DAY 90 tablet 0  . fenofibrate 160 MG tablet Take 1 tablet (160 mg total) by mouth daily. 90 tablet 1  . fexofenadine (ALLEGRA) 30 MG/5ML suspension Take 30 mg by mouth daily.    Marland Kitchen levocetirizine (XYZAL) 5 MG tablet Take 1 tablet (5 mg total) by mouth every evening. 30 tablet 5  . Multiple Vitamin (MULTIVITAMIN) tablet Take 1 tablet by mouth daily.    . Omega-3 Fatty Acids (FISH OIL) 1000 MG CAPS Take 1,000 mg by mouth daily.     . rosuvastatin (CRESTOR) 20 MG tablet Take 1 tablet (20 mg total) by mouth daily. 90 tablet 3  .  triamcinolone ointment (KENALOG) 0.1 % Apply 1 application topically 2 (two) times daily. 80 g 1   No current facility-administered medications on file prior to visit.    Allergies  Allergen Reactions  . Penicillins Rash    Assessment/Plan:  1. Hyperlipidemia - Patient most recent LDL 77 which is slightly above goal of <70, however patient is taking both rosuvastatin and atorvastatin because she misunderstood the instructions.  Triglycerides 119 which is at goal of <150 despite discontinuing fenofibrate.  ALT elevated at 76, likely due to patient taking two high intensity statins.  Counseled patient on cardiologist recommendations to take rosuvastatin and fenofibrate and discontinue atorvastatin.  Discussed the differences between the medications and what each is used to treat.  Patient voiced understanding and reports she will remove the atorvastatin from her pill  box.  Medication list updated. Will recheck lipid panel and LFTs in 1 month  Karren Cobble, PharmD, BCACP, CDCES, [facility] [address] [place], [place] [address] Phone: [phone]; Fax: [phone] 06/21/2020 3:38 PM

## 2020-06-21 NOTE — Patient Instructions (Addendum)
It was nice meeting you today!  We would like your LDL (bad cholesterol) to be less than 70 and your triglycerides less than 150  Please discontinue your atorvastatin and continue your rosuvastatin 20mg  once daily  Please restart your fenofibrate 145mg  once daily  Try to limit processed foods and sweets and continue to avoid alchohol and Tylenol  We will recheck your cholesterol and liver function in 1 month  Please call with any questions   Karren Cobble, PharmD, BCACP, [facility] [place], [facility] [address] [place], [place] [address] Phone: [phone]; Fax: [phone] 06/21/2020 8:55 AM

## 2020-07-18 ENCOUNTER — Other Ambulatory Visit: Payer: 59 | Admitting: *Deleted

## 2020-07-18 ENCOUNTER — Ambulatory Visit: Payer: BC Managed Care – PPO | Admitting: Allergy & Immunology

## 2020-07-18 ENCOUNTER — Other Ambulatory Visit: Payer: Self-pay

## 2020-07-18 DIAGNOSIS — E7800 Pure hypercholesterolemia, unspecified: Secondary | ICD-10-CM

## 2020-07-18 DIAGNOSIS — K760 Fatty (change of) liver, not elsewhere classified: Secondary | ICD-10-CM

## 2020-07-18 LAB — HEPATIC FUNCTION PANEL
ALT: 46 IU/L — ABNORMAL HIGH (ref 0–32)
AST: 37 IU/L (ref 0–40)
Albumin: 4.4 g/dL (ref 3.8–4.8)
Alkaline Phosphatase: 67 IU/L (ref 44–121)
Bilirubin Total: 0.4 mg/dL (ref 0.0–1.2)
Bilirubin, Direct: 0.13 mg/dL (ref 0.00–0.40)
Total Protein: 6.6 g/dL (ref 6.0–8.5)

## 2020-07-18 LAB — LIPID PANEL
Chol/HDL Ratio: 3.5 ratio (ref 0.0–4.4)
Cholesterol, Total: 143 mg/dL (ref 100–199)
HDL: 41 mg/dL (ref >39)
LDL Chol Calc (NIH): 81 mg/dL (ref 0–99)
Triglycerides: 117 mg/dL (ref 0–149)
VLDL Cholesterol Cal: 21 mg/dL (ref 5–40)

## 2020-07-22 ENCOUNTER — Telehealth: Payer: Self-pay | Admitting: Pharmacist

## 2020-07-22 DIAGNOSIS — E7800 Pure hypercholesterolemia, unspecified: Secondary | ICD-10-CM

## 2020-07-22 DIAGNOSIS — K760 Fatty (change of) liver, not elsewhere classified: Secondary | ICD-10-CM

## 2020-07-22 MED ORDER — ROSUVASTATIN CALCIUM 40 MG PO TABS: 40.0000 mg | ORAL_TABLET | Freq: Every day | ORAL | 1 refills | Status: DC

## 2020-07-22 NOTE — Telephone Encounter (Signed)
Spoke with patient.  LFTs recovering however LDL remains the same.  Will increase Crestor to 40mg  and recheck lipid in 3 months.

## 2020-07-30 ENCOUNTER — Ambulatory Visit (INDEPENDENT_AMBULATORY_CARE_PROVIDER_SITE_OTHER): Payer: 59 | Admitting: Allergy & Immunology

## 2020-07-30 ENCOUNTER — Other Ambulatory Visit: Payer: Self-pay | Admitting: Family Medicine

## 2020-07-30 ENCOUNTER — Other Ambulatory Visit: Payer: Self-pay

## 2020-07-30 ENCOUNTER — Encounter: Payer: Self-pay | Admitting: Allergy & Immunology

## 2020-07-30 VITALS — BP 108/80 | HR 64 | Temp 98.1°F | Resp 18 | Ht 62.0 in | Wt 154.6 lb

## 2020-07-30 DIAGNOSIS — L508 Other urticaria: Secondary | ICD-10-CM | POA: Diagnosis not present

## 2020-07-30 DIAGNOSIS — R7989 Other specified abnormal findings of blood chemistry: Secondary | ICD-10-CM

## 2020-07-30 DIAGNOSIS — J301 Allergic rhinitis due to pollen: Secondary | ICD-10-CM | POA: Diagnosis not present

## 2020-07-30 DIAGNOSIS — L239 Allergic contact dermatitis, unspecified cause: Secondary | ICD-10-CM | POA: Diagnosis not present

## 2020-07-30 DIAGNOSIS — R0602 Shortness of breath: Secondary | ICD-10-CM

## 2020-07-30 NOTE — Patient Instructions (Addendum)
1. Seasonal allergic rhinitis due to pollen (trees, outdoor molds) with overlying urticaria - Continue taking: Xyzal (levocetirizine) 5mg  1-2 times daily during the spring/summer to keep the hives at [place] in combination with your Allegra daily.  - We will plan to start Xolair from March through August to control the hives. - Tammy will reach out to discuss this process.  - Xolair can be used through the year, but we can certainly just do this seasonally.  - Consent for this signed today.  - Tammy will reach out to discuss this approval process.   2. Concern for metal sensitivity - Make an appointment for patch testing to metals. - This is placed on a Monday and read on a Wednesday and Friday.   3. Return in about 3 months (around 10/30/2020).   Please inform us of any Emergency Department visits, hospitalizations, or changes in symptoms. Call us before going to the ED for breathing or allergy symptoms since we might be able to fit you in for a sick visit. Feel free to contact us anytime with any questions, problems, or concerns.  It was a pleasure to see you again today!  Websites that have reliable patient information: 1. American Academy of Asthma, Allergy, and Immunology: www.aaaai.org 2. Food Allergy Research and Education (FARE): foodallergy.org 3. Mothers of Asthmatics: http://www.asthmacommunitynetwork.org 4. American College of Allergy, Asthma, and Immunology: www.acaai.org   COVID-19 Vaccine Information can be found at: ShippingScam.co.uk For questions related to vaccine distribution or appointments, please email vaccine@[email or web address] or call [phone])[phone].     "Like" [place] on Facebook and Instagram for our latest updates!        Make sure you are registered to vote! If you have moved or changed any of your contact information, you will need to get this updated before voting!  In some cases, you MAY be able to  register to vote online: [email or web address]

## 2020-07-30 NOTE — Progress Notes (Unsigned)
FOLLOW UP  Date of Service/Encounter:  07/30/20   Assessment:   Seasonal allergic rhinitis due to pollen(trees, outdoor molds)  Rash - more urticarial per the pictures (occurs March through August)  Sedation from antihistamine - interested in starting Xolair  Contact dermatitis   Elevated LFTs - followed by her PCP  Bilateral hearing loss - with hearing aids placed  Plan/Recommendations:   1. Seasonal allergic rhinitis due to pollen (trees, outdoor molds) with overlying urticaria - Continue taking: Xyzal (levocetirizine) 5mg  1-2 times daily during the spring/summer to keep the hives at [place] in combination with your Allegra daily.  - We will plan to start Xolair from March through August to control the hives. - Ann Bass will reach out to discuss this process.  - Xolair can be used through the year, but we can certainly just do this seasonally.  - Consent for this signed today.  - Ann Bass will reach out to discuss this approval process.   2. Concern for metal sensitivity - Make an appointment for patch testing to metals. - This is placed on a Monday and read on a Wednesday and Friday.   3. Return in about 3 months (around 10/30/2020).  Subjective:   Ann Bass is a 50 y.o. female presenting today for follow up of  Chief Complaint  Patient presents with  . Allergic Rhinitis     Ann Bass has a history of the following: Patient Active Problem List   Diagnosis Date Noted  . Abnormal uterine bleeding 08/24/2019  . Steatosis of liver 01/13/2019  . Renal cyst, left 01/13/2019  . Overweight (BMI 25.0-29.9) 01/13/2019  . OSA (obstructive sleep apnea) 09/02/2017  . Ascending aortic aneurysm ([place])   . SOB (shortness of breath) 02/18/2017  . Excessive daytime sleepiness 02/18/2017  . Vitamin D deficiency 09/17/2016  . Physical exam 09/13/2014  . Palpitations 09/13/2014  . Decreased hearing of both ears 09/13/2014  . Glucose intolerance (impaired glucose  tolerance) 02/22/2013  . Depression 02/22/2013  . Hypothyroid 02/22/2013    History obtained from: chart review and patient.  Ann Bass is a 50 y.o. female presenting for a follow up visit.  She was last seen in August 2021.  At that time, we started her on Xyzal 5 mg 1-2 times daily during the spring summer, switching to [place] during the rest of the year since this is less sedating.  We talked about using Xolair from March through August to control the hives.  She did have elevated LFTs at 1 point during her work-up for a rash.  Since the last visit, she has mostly done well. She did developed a rash on her neck recently. In January, she messaged [place] asking for a refill on the triamcinolone. She has been sweating more during the winter months. The triamcinolone cleared it up. She was wearing "costume jewelry". She reports that there is tarnishing shortly after wearing some items. The cosmetic coating wore off. She did some to me with patch testing from another practice, but she does not think that metals were ever addressed.     Asthma/Respiratory Symptom History: She sees Pulmonology for losing her breath easily. She was on an inhaler that did not help. She does not see them any longer. She did have an inhaler that did not seem to make a difference.   Allergic Rhinitis Symptom History: She takes Xyzal with her hive out breaks. She does use Allegra every day, but thre Xyzal is more effective but causes sleepiness. She  has tried cetirizine and this causes sleepiness as well.   Urticaria Symptom History: She is interested in starting Xolair for her hives.  Our plan of the last visit was a started from March through October.  She prefers to not be on medicine all the time, but she was okay with using it during the worst time of the year.  She does want to discuss possible side effects today.  She also is worried about cost.  However, she is not feeling well with the sleepiness positive  medications.  Otherwise, there have been no changes to her past medical history, surgical history, family history, or social history.    Review of Systems  Constitutional: Negative.  Negative for chills, fever, malaise/fatigue and weight loss.  HENT: Negative.  Negative for congestion, ear discharge and ear pain.   Eyes: Negative for pain, discharge and redness.  Respiratory: Negative for cough, sputum production, shortness of breath and wheezing.   Cardiovascular: Negative.  Negative for chest pain and palpitations.  Gastrointestinal: Negative for abdominal pain, heartburn, nausea and vomiting.  Skin: Positive for itching and rash.  Neurological: Negative for dizziness and headaches.  Endo/Heme/Allergies: Negative for environmental allergies. Does not bruise/bleed easily.       Objective:   Blood pressure 108/80, pulse 64, temperature 98.1 F (36.7 C), temperature source Temporal, resp. rate 18, height 5\' 2"  (1.575 m), weight 154 lb 9.6 oz (70.1 kg), SpO2 97 %. Body mass index is 28.28 kg/m.   Physical Exam:  Physical Exam Constitutional:      Appearance: She is well-developed.  HENT:     Head: Normocephalic and atraumatic.     Right Ear: Tympanic membrane, ear canal and external ear normal.     Left Ear: Tympanic membrane, ear canal and external ear normal.     Nose: No nasal deformity, septal deviation, mucosal edema or rhinorrhea.     Right Turbinates: Enlarged and swollen.     Left Turbinates: Enlarged and swollen.     Right Sinus: No maxillary sinus tenderness or frontal sinus tenderness.     Left Sinus: No maxillary sinus tenderness or frontal sinus tenderness.     Mouth/Throat:     Mouth: Mucous membranes are not pale and not dry.     Pharynx: Uvula midline.  Eyes:     General: Lids are normal. Allergic shiner present.        Right eye: No discharge.        Left eye: No discharge.     Conjunctiva/sclera: Conjunctivae normal.     Right eye: Right conjunctiva  is not injected. No chemosis.    Left eye: Left conjunctiva is not injected. No chemosis.    Pupils: Pupils are equal, round, and reactive to light.  Cardiovascular:     Rate and Rhythm: Normal rate and regular rhythm.     Heart sounds: Normal heart sounds.  Pulmonary:     Effort: Pulmonary effort is normal. No tachypnea, accessory muscle usage or respiratory distress.     Breath sounds: Normal breath sounds. No wheezing, rhonchi or rales.  Chest:     Chest wall: No tenderness.  Lymphadenopathy:     Cervical: No cervical adenopathy.  Skin:    General: Skin is warm.     Capillary Refill: Capillary refill takes less than 2 seconds.     Coloration: Skin is not pale.     Findings: No abrasion, erythema, petechiae or rash. Rash is not papular, urticarial or vesicular.  Comments: She has a resolving urticaria on her neck, which seems exacerbated by some possible coexisting contact dermatitis. She has excoriations on her bilateral arms.  Neurological:     Mental Status: She is alert.  Psychiatric:        Behavior: Behavior is cooperative.      Diagnostic studies: none    Salvatore Marvel, MD  [facility]

## 2020-07-31 ENCOUNTER — Other Ambulatory Visit: Payer: Self-pay | Admitting: Family Medicine

## 2020-07-31 ENCOUNTER — Encounter: Payer: Self-pay | Admitting: Allergy & Immunology

## 2020-07-31 NOTE — Telephone Encounter (Signed)
Please see message and advise 

## 2020-08-05 ENCOUNTER — Other Ambulatory Visit: Payer: Self-pay

## 2020-08-05 ENCOUNTER — Ambulatory Visit (INDEPENDENT_AMBULATORY_CARE_PROVIDER_SITE_OTHER): Payer: 59 | Admitting: Family

## 2020-08-05 ENCOUNTER — Ambulatory Visit: Payer: 59

## 2020-08-05 ENCOUNTER — Encounter: Payer: Self-pay | Admitting: Family

## 2020-08-05 VITALS — BP 122/80 | HR 60 | Temp 97.8°F | Resp 18 | Ht 62.0 in | Wt 150.0 lb

## 2020-08-05 DIAGNOSIS — L239 Allergic contact dermatitis, unspecified cause: Secondary | ICD-10-CM | POA: Diagnosis not present

## 2020-08-05 NOTE — Progress Notes (Signed)
Follow-up Note  RE: YARNELL KOZLOSKI MRN: [id number] DOB: 12/08/1970 Date of Office Visit: 08/05/2020  Primary care provider: [name], MD Referring provider: [name], MD   Ashia returns to the office today for the patch test placement, given suspected history of contact dermatitis.    Diagnostics: metal  patches placed.    Plan:   Allergic contact dermatitis - Instructions provided on care of the patches for the next 48 hours. Nikkol Pai was instructed to avoid showering for the next 48 hours. Shanvi Moyd will follow up in 48 hours and 96 hours for patch readings.  Please do not hesitate to contact us with any questions.  Thank you, Althea Charon, FNP

## 2020-08-06 ENCOUNTER — Telehealth: Payer: Self-pay | Admitting: *Deleted

## 2020-08-06 NOTE — Patient Instructions (Addendum)
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  Call the clinic if this treatment plan is not working well for you  Follow up in 2 or sooner if needed.

## 2020-08-06 NOTE — Telephone Encounter (Signed)
-----   Message from Valentina Shaggy, MD sent at 07/31/2020  9:33 AM EDT ----- Barry Brunner start

## 2020-08-06 NOTE — Telephone Encounter (Signed)
Spoke to patient and advised approval, copay card and submit to pharmacy for [place].Will reach out to patient once delivery set up

## 2020-08-07 ENCOUNTER — Encounter: Payer: Self-pay | Admitting: Family Medicine

## 2020-08-07 ENCOUNTER — Ambulatory Visit: Payer: 59 | Admitting: Family Medicine

## 2020-08-07 ENCOUNTER — Other Ambulatory Visit: Payer: Self-pay

## 2020-08-07 VITALS — Temp 97.3°F

## 2020-08-07 DIAGNOSIS — L230 Allergic contact dermatitis due to metals: Secondary | ICD-10-CM

## 2020-08-07 NOTE — Progress Notes (Signed)
    Follow-up Note  RE: Ann Bass MRN: [id number] DOB: 11/05/1970 Date of Office Visit: 08/07/2020  Primary care provider: [name], MD Referring provider: [name], MD   [name] returns to the office today for the final patch test interpretation, given suspected history of contact dermatitis due to metals.   Diagnostics:  Metals testing 96-hour hour reading: Negative to chromium chloride 1%, potassium dichromate 0.25%, cobalt chloride hexahydrate 1%, copper sulfate pentahydrate 2%, molybdenum chloride 0.5%, titanium 0.1%, tantal, magnese chloride 0.5%, nickel sulfate hexahydrate 5%, aluminum hydroxide 10%, and vanadium pentoxide 10%   Allergic contact dermatitis Patches were removed at today's visit and all metals were negative at today's reading Follow up in 2 days for the final reading  Call the clinic if this treatment plan is not working well for you  Follow up in 2 days or sooner if needed.

## 2020-08-08 NOTE — Progress Notes (Signed)
Follow Up Note  RE: Ann Bass MRN: [id number] DOB: January 01, 1971 Date of Office Visit: 08/09/2020  Referring provider: [name], MD Primary care provider: [name], MD  History of Present Illness: I had the pleasure of seeing Ann Bass for a follow up visit at the [facility] on 08/09/2020. She is a 50 y.o. female, who is being followed for metal sensitivity and allergic rhinitis. Today she is here for final patch test interpretation, given suspected history of contact dermatitis.   Patient has some rash on her neck now and concerned about cholinergic urticaria. She did change her shampoo recently to one to help with her scalp itching.   Diagnostics:  Metal Patch test  96 hour reading:   Metals Patch - 08/09/20 1200    Time Antigen Placed 1000    Manufacturer Other    Location Back    Number of Test 11    Aluminum Hydroxide 10% 0    Chromium chloride 1% --   +/-   Cobalt chloride hexahydrate 1% --   +/-   Molybdenum chloride 0.5% 0    Nickel sulfate hexahydrate 5% 0    Potassium dichromate 0.25% 0    Copper sulfate pentahydrate 2% 0    Tantal 1% 0    Titanium 0.1% 0    Manganese chloride 0.5% 0    Vanadium Pentoxide 10% 0            Assessment and Plan: Ann Bass is a 50 y.o. female with: Allergic contact dermatitis 96 hour metal patch reading was borderline positive to chromium chloride and cobalt.  Handout given on these two items and recommend avoidance.   Rash Developed rash on neck. Recently changed shampoos.  Advised to use shampoo for sensitive skin - samples given.  May use triamcinolone 0.1% ointment twice a day as needed for eczema flares. Do not use more than 3 weeks in a row.   Take zyrtec 10mg  daily and may take twice a day if needed.  Follow up with Dr. Ernst Bowler as scheduled in June 2022.  Return in about 3 months (around 11/09/2020).  No orders of the defined types were placed in this  encounter.  Lab Orders  No laboratory test(s) ordered today    Medication List:  Current Outpatient Medications  Medication Sig Dispense Refill  . albuterol (VENTOLIN HFA) 108 (90 Base) MCG/ACT inhaler Inhale 2 puffs into the lungs every 6 (six) hours as needed. 18 g 5  . ARMOUR THYROID 15 MG tablet TAKE 1 TABLET ON MONDAY AND THURSDAY (WITH 60MG  TABLET FOR 75MG  TOTALDOSE) 24 tablet 1  . ARMOUR THYROID 60 MG tablet Take 1 tablet (60 mg total) by mouth daily before breakfast. 90 tablet 1  . Ascorbic Acid (VITAMIN C) 1000 MG tablet Take 1,000 mg by mouth daily.    . cholecalciferol (VITAMIN D3) 25 MCG (1000 UNIT) tablet Take 1,000 Units by mouth daily.    . citalopram (CELEXA) 20 MG tablet TAKE 1 TABLET BY MOUTH EVERY DAY 90 tablet 0  . fenofibrate 160 MG tablet Take 1 tablet (160 mg total) by mouth daily. 90 tablet 1  . fexofenadine (ALLEGRA) 180 MG tablet Take 180 mg by mouth daily.    Marland Kitchen levocetirizine (XYZAL) 5 MG tablet Take 1 tablet (5 mg total) by mouth every evening. 30 tablet 5  . Multiple Vitamin (MULTIVITAMIN) tablet Take 1 tablet by mouth daily.    . Omega-3 Fatty Acids (FISH OIL)  1000 MG CAPS Take 1,000 mg by mouth daily.     . rosuvastatin (CRESTOR) 40 MG tablet Take 1 tablet (40 mg total) by mouth daily. 90 tablet 1  . triamcinolone ointment (KENALOG) 0.1 % Apply 1 application topically 2 (two) times daily. 80 g 1   No current facility-administered medications for this visit.   Allergies: Allergies  Allergen Reactions  . Penicillins Rash   I reviewed her past medical history, social history, family history, and environmental history and no significant changes have been reported from her previous visit.  Review of Systems  Constitutional: Negative for appetite change, chills, fever and unexpected weight change.  HENT: Negative for congestion and rhinorrhea.   Eyes: Negative for itching.  Respiratory: Negative for cough, chest tightness, shortness of breath and wheezing.    Gastrointestinal: Negative for abdominal pain.  Skin: Positive for rash.  Allergic/Immunologic: Positive for environmental allergies.  Neurological: Negative for headaches.   Objective: Temp 97.9 F (36.6 C)  There is no height or weight on file to calculate BMI. Physical Exam Vitals and nursing note reviewed.  Constitutional:      Appearance: Normal appearance.  HENT:     Head: Normocephalic and atraumatic.  Skin:    Findings: Rash present.     Comments: Erythematous patch on the posterior neck.  Neurological:     Mental Status: She is alert.    Previous notes and tests were reviewed. The plan was reviewed with the patient/family, and all questions/concerned were addressed.  It was my pleasure to see Ann Bass today and participate in her care. Please feel free to contact me with any questions or concerns.  Sincerely,  Rexene Alberts, DO Allergy & Immunology  [facility] office: [phone] [facility] office: [place] office: [phone]

## 2020-08-09 ENCOUNTER — Other Ambulatory Visit: Payer: Self-pay

## 2020-08-09 ENCOUNTER — Ambulatory Visit (INDEPENDENT_AMBULATORY_CARE_PROVIDER_SITE_OTHER): Payer: 59 | Admitting: Allergy

## 2020-08-09 ENCOUNTER — Encounter: Payer: Self-pay | Admitting: Allergy

## 2020-08-09 VITALS — Temp 97.9°F

## 2020-08-09 DIAGNOSIS — L239 Allergic contact dermatitis, unspecified cause: Secondary | ICD-10-CM | POA: Insufficient documentation

## 2020-08-09 DIAGNOSIS — R21 Rash and other nonspecific skin eruption: Secondary | ICD-10-CM | POA: Insufficient documentation

## 2020-08-09 NOTE — Assessment & Plan Note (Signed)
96 hour metal patch reading was borderline positive to chromium chloride and cobalt.  Handout given on these two items and recommend avoidance.

## 2020-08-09 NOTE — Patient Instructions (Addendum)
Borderline positive to chromium and cobalt.  Avoid these items.  Follow up with Dr. Ernst Bowler as scheduled in June 2022

## 2020-08-09 NOTE — Assessment & Plan Note (Signed)
Developed rash on neck. Recently changed shampoos.  Advised to use shampoo for sensitive skin - samples given.  May use triamcinolone 0.1% ointment twice a day as needed for eczema flares. Do not use more than 3 weeks in a row.   Take zyrtec 10mg  daily and may take twice a day if needed.  Follow up with Dr. Ernst Bowler as scheduled in June 2022.

## 2020-08-12 ENCOUNTER — Encounter: Payer: 59 | Admitting: Family

## 2020-08-12 ENCOUNTER — Ambulatory Visit: Payer: 59

## 2020-08-14 DIAGNOSIS — E7800 Pure hypercholesterolemia, unspecified: Secondary | ICD-10-CM

## 2020-08-14 MED ORDER — FENOFIBRATE 160 MG PO TABS: 160.0000 mg | ORAL_TABLET | Freq: Every day | ORAL | 1 refills | Status: DC

## 2020-08-16 ENCOUNTER — Encounter: Payer: Self-pay | Admitting: Allergy & Immunology

## 2020-08-20 ENCOUNTER — Other Ambulatory Visit: Payer: Self-pay

## 2020-08-20 MED ORDER — EPINEPHRINE 0.3 MG/0.3ML IJ SOAJ: 0.3000 mg | Freq: Once | INTRAMUSCULAR | 1 refills | Status: AC

## 2020-08-26 ENCOUNTER — Ambulatory Visit (INDEPENDENT_AMBULATORY_CARE_PROVIDER_SITE_OTHER): Payer: 59

## 2020-08-26 DIAGNOSIS — L501 Idiopathic urticaria: Secondary | ICD-10-CM

## 2020-08-26 MED ORDER — OMALIZUMAB 150 MG ~~LOC~~ SOLR
300.0000 mg | SUBCUTANEOUS | Status: AC
Start: 2020-08-26 — End: ?
  Administered 2020-08-26 – 2024-05-30 (×40): 300 mg via SUBCUTANEOUS

## 2020-08-26 NOTE — Progress Notes (Signed)
Immunotherapy   Patient Details  Name: ZORIA RAWLINSON MRN: [id number] Date of Birth: September 08, 1970  08/26/2020  [name] starte Following schedule: Started Xolair injections for every 28 days, patient waited 30 minutes in the office without any reactions.  Frequency:Every 28 days Epi-Pen:Yes  Consent signed and patient instructions given.   Isabel Caprice 08/26/2020, 3:12 PM

## 2020-09-02 ENCOUNTER — Other Ambulatory Visit: Payer: Self-pay | Admitting: Family Medicine

## 2020-09-23 ENCOUNTER — Ambulatory Visit (INDEPENDENT_AMBULATORY_CARE_PROVIDER_SITE_OTHER): Payer: 59 | Admitting: *Deleted

## 2020-09-23 ENCOUNTER — Other Ambulatory Visit: Payer: Self-pay

## 2020-09-23 DIAGNOSIS — L501 Idiopathic urticaria: Secondary | ICD-10-CM

## 2020-10-16 ENCOUNTER — Other Ambulatory Visit: Payer: 59

## 2020-10-19 ENCOUNTER — Encounter: Payer: Self-pay | Admitting: Family Medicine

## 2020-10-28 ENCOUNTER — Ambulatory Visit (INDEPENDENT_AMBULATORY_CARE_PROVIDER_SITE_OTHER): Payer: 59

## 2020-10-28 ENCOUNTER — Other Ambulatory Visit: Payer: Self-pay

## 2020-10-28 ENCOUNTER — Other Ambulatory Visit: Payer: 59

## 2020-10-28 DIAGNOSIS — L501 Idiopathic urticaria: Secondary | ICD-10-CM

## 2020-10-28 DIAGNOSIS — E7800 Pure hypercholesterolemia, unspecified: Secondary | ICD-10-CM

## 2020-10-28 DIAGNOSIS — K760 Fatty (change of) liver, not elsewhere classified: Secondary | ICD-10-CM

## 2020-10-28 LAB — LIPID PANEL
Chol/HDL Ratio: 2.9 ratio (ref 0.0–4.4)
Cholesterol, Total: 109 mg/dL (ref 100–199)
HDL: 38 mg/dL — ABNORMAL LOW (ref >39)
LDL Chol Calc (NIH): 53 mg/dL (ref 0–99)
Triglycerides: 90 mg/dL (ref 0–149)
VLDL Cholesterol Cal: 18 mg/dL (ref 5–40)

## 2020-10-28 LAB — HEPATIC FUNCTION PANEL
ALT: 22 IU/L (ref 0–32)
AST: 23 IU/L (ref 0–40)
Albumin: 4.3 g/dL (ref 3.8–4.8)
Alkaline Phosphatase: 53 IU/L (ref 44–121)
Bilirubin Total: 0.6 mg/dL (ref 0.0–1.2)
Bilirubin, Direct: 0.17 mg/dL (ref 0.00–0.40)
Total Protein: 6.6 g/dL (ref 6.0–8.5)

## 2020-11-01 ENCOUNTER — Other Ambulatory Visit: Payer: Self-pay | Admitting: Family Medicine

## 2020-11-04 ENCOUNTER — Other Ambulatory Visit: Payer: Self-pay

## 2020-11-04 DIAGNOSIS — F32A Depression, unspecified: Secondary | ICD-10-CM

## 2020-11-04 MED ORDER — CITALOPRAM HYDROBROMIDE 20 MG PO TABS: 20.0000 mg | ORAL_TABLET | Freq: Every day | ORAL | 0 refills | Status: DC

## 2020-11-05 ENCOUNTER — Ambulatory Visit (INDEPENDENT_AMBULATORY_CARE_PROVIDER_SITE_OTHER): Payer: 59 | Admitting: Allergy & Immunology

## 2020-11-05 ENCOUNTER — Other Ambulatory Visit: Payer: Self-pay

## 2020-11-05 ENCOUNTER — Encounter: Payer: Self-pay | Admitting: Allergy & Immunology

## 2020-11-05 VITALS — BP 122/82 | HR 65 | Temp 97.2°F | Resp 16 | Ht 62.0 in | Wt 147.8 lb

## 2020-11-05 DIAGNOSIS — L501 Idiopathic urticaria: Secondary | ICD-10-CM

## 2020-11-05 DIAGNOSIS — J301 Allergic rhinitis due to pollen: Secondary | ICD-10-CM

## 2020-11-05 DIAGNOSIS — L239 Allergic contact dermatitis, unspecified cause: Secondary | ICD-10-CM | POA: Diagnosis not present

## 2020-11-05 MED ORDER — LEVOCETIRIZINE DIHYDROCHLORIDE 5 MG PO TABS: ORAL_TABLET | ORAL | 3 refills | Status: AC

## 2020-11-05 NOTE — Patient Instructions (Signed)
1. Seasonal allergic rhinitis due to pollen (trees, outdoor molds) with overlying urticaria - Continue taking: Xyzal (levocetirizine) 5mg  1-2 times daily during the spring/summer to keep the hives at [place] in combination with your Allegra daily.  - We will plan to start Xolair from March through August to control the hives. - Tammy will reach out to discuss this process.  - Xolair can be used through the year, but we can certainly just do this seasonally.  - Consent for this signed today.  - Tammy will reach out to discuss this approval process.  2. Seasonal urticaria  - Continue with Xolair every month.  - We will plan to continue monthly through the winter. - Continue with Allegra daily and Xyzal as needed. - Try stopping the Allegra to see how you do (if you want to).  - You can try going to every other day for a couple of weeks and then stopping completely.    3 Metal sensitivity (chromium and cobalt)  - Avoid triggering jewelry items.    Please inform us of any Emergency Department visits, hospitalizations, or changes in symptoms. Call us before going to the ED for breathing or allergy symptoms since we might be able to fit you in for a sick visit. Feel free to contact us anytime with any questions, problems, or concerns.  It was a pleasure to see you again today!  Websites that have reliable patient information: 1. American Academy of Asthma, Allergy, and Immunology: www.aaaai.org 2. Food Allergy Research and Education (FARE): foodallergy.org 3. Mothers of Asthmatics: http://www.asthmacommunitynetwork.org 4. American College of Allergy, Asthma, and Immunology: www.acaai.org   COVID-19 Vaccine Information can be found at: ShippingScam.co.uk For questions related to vaccine distribution or appointments, please email vaccine@[facility] .com or call [phone].   We realize that you might be concerned about having an allergic  reaction to the COVID19 vaccines. To help with that concern, WE ARE OFFERING THE COVID19 VACCINES IN OUR OFFICE! Ask the front desk for dates!     "Like" [place] on Facebook and Instagram for our latest updates!      A healthy democracy works best when [employer] participate! Make sure you are registered to vote! If you have moved or changed any of your contact information, you will need to get this updated before voting!  In some cases, you MAY be able to register to vote online: [email or web address]

## 2020-11-05 NOTE — Progress Notes (Signed)
FOLLOW UP  Date of Service/Encounter:  11/05/20   Assessment:    Seasonal allergic rhinitis due to pollen (trees, outdoor molds)   Rash that appears more urticarial per the pictures (occurs March through August) - marked improved with the initiation of Xolair  Contact dermatitis (chromium and cobalt)   Elevated LFTs - followed by her PCP   Bilateral hearing loss - with hearing aids placed  Plan/Recommendations:    1. Seasonal allergic rhinitis due to pollen (trees, outdoor molds) with overlying urticaria - Continue taking: Xyzal (levocetirizine) 5mg  1-2 times daily AS NEEDED  - Wean the Allegra as discussed below.    2. Seasonal urticaria  - Continue with Xolair every month.  - We will plan to continue monthly through the winter. - Continue with Allegra daily and Xyzal as needed. - Try stopping the Allegra to see how you do (if you want to).  - You can try going to every other day for a couple of weeks and then stopping completely.    3 Metal sensitivity (chromium and cobalt)  - Avoid triggering jewelry items.   3. Return in about 1 year (around 11/05/2021).   Subjective:   Ann Bass is a 50 y.o. female presenting today for follow up of  Chief Complaint  Patient presents with   Allergic Rhinitis     Xolair injections have been helping     Ann Bass has a history of the following: Patient Active Problem List   Diagnosis Date Noted   Allergic contact dermatitis 08/09/2020   Rash 08/09/2020   Abnormal uterine bleeding 08/24/2019   Steatosis of liver 01/13/2019   Renal cyst, left 01/13/2019   Overweight (BMI 25.0-29.9) 01/13/2019   OSA (obstructive sleep apnea) 09/02/2017   Ascending aortic aneurysm (HCC)    SOB (shortness of breath) 02/18/2017   Excessive daytime sleepiness 02/18/2017   Vitamin D deficiency 09/17/2016   Physical exam 09/13/2014   Palpitations 09/13/2014   Decreased hearing of both ears 09/13/2014   Glucose intolerance  (impaired glucose tolerance) 02/22/2013   Depression 02/22/2013   Hypothyroid 02/22/2013    History obtained from: chart review and patient.  Ann Bass is a 50 y.o. female presenting for a follow up visit. She was last seen in March 2022. At that time, we made the decision to start Xolair for control of this urticarial rash. Because the rash was only occurring during certain times of the year, I reassured her that we could continue it only during the relevant times of the year. She was experiencing sedation from the antihistamines, therefore she signed onto start [place]. She has since started it and received multiple doses. We also scheduled her for patch testing due to concern with reactivity to costume jewelry.   In the interim, she underwent patch testing to metals.  She was slightly reactive to chromium as well as cobalt.  She has done very well on the Xolair. She is continuing to take the Allegra every day. She has been using the Xyzal as needed. She is open to stopping some of the antihistamines. She has done very well with the Xolair and it has been life changing for her. She has not been having any of the itching and none of the outbreaks.   The entire family contracted COVID earlier this month. Unfortunately it messed up the family trip to [place] to see Ann Bass's family. They are selling her childhood home and might be moving to [place], where they are both from.  Otherwise, there have been no changes to her past medical history, surgical history, family history, or social history.    Review of Systems  Constitutional: Negative.  Negative for fever, malaise/fatigue and weight loss.  HENT: Negative.  Negative for congestion, ear discharge and ear pain.   Eyes:  Negative for pain, discharge and redness.  Respiratory:  Negative for cough, sputum production, shortness of breath and wheezing.   Cardiovascular: Negative.  Negative for chest pain and palpitations.  Gastrointestinal:  Negative  for abdominal pain and heartburn.  Skin: Negative.  Negative for itching and rash.  Neurological:  Negative for dizziness and headaches.  Endo/Heme/Allergies:  Negative for environmental allergies. Does not bruise/bleed easily.      Objective:   Blood pressure 122/82, pulse 65, temperature (!) 97.2 F (36.2 C), resp. rate 16, height 5\' 2"  (1.575 m), weight 147 lb 12.8 oz (67 kg), SpO2 98 %. Body mass index is 27.03 kg/m.   Physical Exam:  Physical Exam Constitutional:      Appearance: She is well-developed.  HENT:     Head: Normocephalic and atraumatic.     Right Ear: Tympanic membrane, ear canal and external ear normal.     Left Ear: Tympanic membrane, ear canal and external ear normal.     Nose: No nasal deformity, septal deviation, mucosal edema or rhinorrhea.     Right Turbinates: Not enlarged or swollen.     Left Turbinates: Not enlarged or swollen.     Right Sinus: No maxillary sinus tenderness or frontal sinus tenderness.     Left Sinus: No maxillary sinus tenderness or frontal sinus tenderness.     Mouth/Throat:     Mouth: Mucous membranes are not pale and not dry.     Pharynx: Uvula midline.  Eyes:     General: Lids are normal. No allergic shiner.       Right eye: No discharge.        Left eye: No discharge.     Conjunctiva/sclera: Conjunctivae normal.     Right eye: Right conjunctiva is not injected. No chemosis.    Left eye: Left conjunctiva is not injected. No chemosis.    Pupils: Pupils are equal, round, and reactive to light.  Cardiovascular:     Rate and Rhythm: Normal rate and regular rhythm.     Heart sounds: Normal heart sounds.  Pulmonary:     Effort: Pulmonary effort is normal. No tachypnea, accessory muscle usage or respiratory distress.     Breath sounds: Normal breath sounds. No wheezing, rhonchi or rales.  Chest:     Chest wall: No tenderness.  Lymphadenopathy:     Cervical: No cervical adenopathy.  Skin:    Coloration: Skin is not pale.      Findings: No abrasion, erythema, petechiae or rash. Rash is not papular, urticarial or vesicular.  Neurological:     Mental Status: She is alert.  Psychiatric:        Behavior: Behavior is cooperative.     Diagnostic studies: none       Salvatore Marvel, MD  [facility]

## 2020-11-13 ENCOUNTER — Encounter: Payer: Self-pay | Admitting: *Deleted

## 2020-11-25 ENCOUNTER — Ambulatory Visit (INDEPENDENT_AMBULATORY_CARE_PROVIDER_SITE_OTHER): Payer: 59

## 2020-11-25 ENCOUNTER — Other Ambulatory Visit: Payer: Self-pay

## 2020-11-25 DIAGNOSIS — L501 Idiopathic urticaria: Secondary | ICD-10-CM

## 2020-11-26 ENCOUNTER — Encounter: Payer: Self-pay | Admitting: Allergy & Immunology

## 2020-12-23 ENCOUNTER — Other Ambulatory Visit: Payer: Self-pay

## 2020-12-23 ENCOUNTER — Ambulatory Visit (INDEPENDENT_AMBULATORY_CARE_PROVIDER_SITE_OTHER): Payer: 59 | Admitting: *Deleted

## 2020-12-23 ENCOUNTER — Telehealth: Payer: Self-pay | Admitting: *Deleted

## 2020-12-23 DIAGNOSIS — L501 Idiopathic urticaria: Secondary | ICD-10-CM

## 2020-12-23 NOTE — Telephone Encounter (Signed)
Error

## 2021-01-21 ENCOUNTER — Ambulatory Visit (INDEPENDENT_AMBULATORY_CARE_PROVIDER_SITE_OTHER): Payer: 59

## 2021-01-21 ENCOUNTER — Other Ambulatory Visit: Payer: Self-pay

## 2021-01-21 DIAGNOSIS — L501 Idiopathic urticaria: Secondary | ICD-10-CM | POA: Diagnosis not present

## 2021-01-22 ENCOUNTER — Other Ambulatory Visit: Payer: Self-pay | Admitting: Cardiology

## 2021-01-22 DIAGNOSIS — E7800 Pure hypercholesterolemia, unspecified: Secondary | ICD-10-CM

## 2021-02-12 ENCOUNTER — Other Ambulatory Visit: Payer: Self-pay

## 2021-02-12 DIAGNOSIS — I712 Thoracic aortic aneurysm, without rupture, unspecified: Secondary | ICD-10-CM

## 2021-02-17 ENCOUNTER — Other Ambulatory Visit: Payer: Self-pay | Admitting: Cardiology

## 2021-02-17 DIAGNOSIS — E7800 Pure hypercholesterolemia, unspecified: Secondary | ICD-10-CM

## 2021-02-18 ENCOUNTER — Other Ambulatory Visit: Payer: Self-pay | Admitting: Family Medicine

## 2021-02-18 ENCOUNTER — Ambulatory Visit: Payer: 59

## 2021-02-18 DIAGNOSIS — F32A Depression, unspecified: Secondary | ICD-10-CM

## 2021-02-19 ENCOUNTER — Other Ambulatory Visit: Payer: Self-pay | Admitting: Cardiology

## 2021-02-19 DIAGNOSIS — E7800 Pure hypercholesterolemia, unspecified: Secondary | ICD-10-CM

## 2021-02-20 ENCOUNTER — Other Ambulatory Visit: Payer: Self-pay

## 2021-02-20 ENCOUNTER — Ambulatory Visit (INDEPENDENT_AMBULATORY_CARE_PROVIDER_SITE_OTHER): Payer: 59 | Admitting: Family Medicine

## 2021-02-20 ENCOUNTER — Encounter: Payer: Self-pay | Admitting: Family Medicine

## 2021-02-20 VITALS — BP 120/70 | HR 64 | Temp 98.1°F | Resp 16 | Ht 62.0 in | Wt 153.0 lb

## 2021-02-20 DIAGNOSIS — F32A Depression, unspecified: Secondary | ICD-10-CM | POA: Diagnosis not present

## 2021-02-20 DIAGNOSIS — R5383 Other fatigue: Secondary | ICD-10-CM | POA: Diagnosis not present

## 2021-02-20 DIAGNOSIS — E010 Iodine-deficiency related diffuse (endemic) goiter: Secondary | ICD-10-CM

## 2021-02-20 DIAGNOSIS — Z8616 Personal history of COVID-19: Secondary | ICD-10-CM | POA: Diagnosis not present

## 2021-02-20 LAB — COMPREHENSIVE METABOLIC PANEL
ALT: 28 U/L (ref 0–35)
AST: 28 U/L (ref 0–37)
Albumin: 4.4 g/dL (ref 3.5–5.2)
Alkaline Phosphatase: 50 U/L (ref 39–117)
BUN: 12 mg/dL (ref 6–23)
CO2: 26 mEq/L (ref 19–32)
Calcium: 9.8 mg/dL (ref 8.4–10.5)
Chloride: 103 mEq/L (ref 96–112)
Creatinine, Ser: 0.68 mg/dL (ref 0.40–1.20)
GFR: 101.83 mL/min (ref >60.00)
Glucose, Bld: 91 mg/dL (ref 70–99)
Potassium: 3.9 mEq/L (ref 3.5–5.1)
Sodium: 137 mEq/L (ref 135–145)
Total Bilirubin: 0.6 mg/dL (ref 0.2–1.2)
Total Protein: 6.9 g/dL (ref 6.0–8.3)

## 2021-02-20 LAB — CBC
HCT: 44 % (ref 36.0–46.0)
Hemoglobin: 14.9 g/dL (ref 12.0–15.0)
MCHC: 33.9 g/dL (ref 30.0–36.0)
MCV: 88.8 fl (ref 78.0–100.0)
Platelets: 325 10*3/uL (ref 150.0–400.0)
RBC: 4.96 Mil/uL (ref 3.87–5.11)
RDW: 13.4 % (ref 11.5–15.5)
WBC: 6.2 10*3/uL (ref 4.0–10.5)

## 2021-02-20 LAB — TSH: TSH: 3.25 u[IU]/mL (ref 0.35–5.50)

## 2021-02-20 MED ORDER — CITALOPRAM HYDROBROMIDE 40 MG PO TABS: 40.0000 mg | ORAL_TABLET | Freq: Every day | ORAL | 3 refills | Status: DC

## 2021-02-20 NOTE — Progress Notes (Signed)
Subjective:  Patient ID: Ann Bass, female    DOB: March 15, 1971  Age: 50 y.o. MRN: [id number]  CC:  Chief Complaint  Patient presents with   Depression    Pt needs refill citalopram as she has not seen PCP recently reports no other concerns     HPI Ann Bass presents for   Depression: Treated with Celexa 20 mg daily.  Primary provider Dr. Birdie Riddle. Same dose for years.  Has worked well prior, some increased depression symptoms - past year, possible since hysterectomy in 2021. Some persistent fatigue, brain fog. Past few months. Covid infection in May. Brain fog, fatigue better but still persistent.   Hx of hypothyroidism in past - treated by homeopath with armour thyroid. Off for awhile, had been referred to endocrinology but declined referral. Levels still in normal range. No chest pains.  Chronic heat intolerance.  Thyroid US in 2018: No discrete nodules are seen within the thyroid gland.  IMPRESSION: 1. Enlarged markedly heterogeneous thyroid without focal lesion.  The above is in keeping with the ACR TI-RADS recommendations - J Am Coll Radiol 2017;14:587-595.  Prior elevated LFTs, normal on recent testing.    Lab Results  Component Value Date   ALT 22 10/28/2020   AST 23 10/28/2020   ALKPHOS 53 10/28/2020   BILITOT 0.6 10/28/2020     Lab Results  Component Value Date   TSH 0.90 07/27/2019   Lab Results  Component Value Date   WBC 6.0 08/17/2019   HGB 15.4 (H) 08/17/2019   HCT 47.1 (H) 08/17/2019   MCV 90.1 08/17/2019   PLT 302 08/17/2019   Lab Results  Component Value Date   NA 139 12/22/2019   K 4.9 12/22/2019   CL 101 12/22/2019   CO2 22 12/22/2019   Lab Results  Component Value Date   CREATININE 0.64 12/22/2019      Depression screen [facility] 2/9 02/20/2021 04/17/2020 09/19/2019 07/27/2019 01/13/2019  Decreased Interest 1 0 0 0 0  Down, Depressed, Hopeless 1 0 0 0 0  PHQ - 2 Score 2 0 0 0 0  Altered sleeping 1 0 - 0 0  Tired, decreased energy  3 0 - 0 0  Change in appetite 2 0 - 0 0  Feeling bad or failure about yourself  1 0 - 0 0  Trouble concentrating 3 0 - 0 0  Moving slowly or fidgety/restless 0 0 - 0 0  Suicidal thoughts 0 0 - 0 0  PHQ-9 Score 12 0 - 0 0  Difficult doing work/chores - Not difficult at all - Not difficult at all Not difficult at all  Some recent data might be hidden    History Patient Active Problem List   Diagnosis Date Noted   Allergic contact dermatitis 08/09/2020   Rash 08/09/2020   Abnormal uterine bleeding 08/24/2019   Steatosis of liver 01/13/2019   Renal cyst, left 01/13/2019   Overweight (BMI 25.0-29.9) 01/13/2019   OSA (obstructive sleep apnea) 09/02/2017   Ascending aortic aneurysm    SOB (shortness of breath) 02/18/2017   Excessive daytime sleepiness 02/18/2017   Vitamin D deficiency 09/17/2016   Physical exam 09/13/2014   Palpitations 09/13/2014   Decreased hearing of both ears 09/13/2014   Glucose intolerance (impaired glucose tolerance) 02/22/2013   Depression 02/22/2013   Hypothyroid 02/22/2013   Past Medical History:  Diagnosis Date   Allergy    Depression    post-partum   Dilated aortic root ([place])  21m by chest MRI 01/2020   Dyspnea    Dysrhythmia    occational   History of gestational diabetes    Hyperlipidemia    Hypertension    Hypothyroidism    Inflammatory polyps of colon ([place]    Liver disease    OSA (obstructive sleep apnea) 09/02/2017   Mild OSA with an AHI of 6.8/h.  On CPAP   Palpitations    PACs noted on event monitor   Pre-diabetes    updated epic hx 08-23-2019 per pt she is pre-diabetic   Past Surgical History:  Procedure Laterality Date   broken leg  1982   Left Femur, traction and pin in leg   COLONOSCOPY  2018   ROBOTIC ASSISTED LAPAROSCOPIC HYSTERECTOMY AND SALPINGECTOMY Bilateral 08/24/2019   Procedure: XI ROBOTIC ASSISTED LAPAROSCOPIC HYSTERECTOMY AND SALPINGECTOMY;  Surgeon: RDelsa Bern MD;  [facility]   Service: Gynecology;  Laterality: Bilateral;   WISDOM TOOTH EXTRACTION  1998   Allergies  Allergen Reactions   Penicillins Rash   Prior to Admission medications   Medication Sig Start Date End Date Taking? Authorizing Provider  Ascorbic Acid (VITAMIN C) 1000 MG tablet Take 1,000 mg by mouth daily.   Yes [provider]  cholecalciferol (VITAMIN D3) 25 MCG (1000 UNIT) tablet Take 1,000 Units by mouth daily.   Yes [provider]  citalopram (CELEXA) 20 MG tablet Take 1 tablet (20 mg total) by mouth daily. 11/04/20  Yes TMidge Minium MD  EPINEPHrine 0.3 mg/0.3 mL IJ SOAJ injection INJECT 0.3 MG INTO THE MUSCLE ONCE FOR 1 DOSE. 08/20/20  Yes [provider]  fenofibrate 160 MG tablet Take 1 tablet (160 mg total) by mouth daily. Please make overdue appt with Dr. TRadford Paxbefore anymore refills. Thank you 1st attempt 02/17/21  Yes Turner, TEber Hong MD  fexofenadine (ALLEGRA) 180 MG tablet Take 180 mg by mouth daily.   Yes [provider]  levocetirizine (XYZAL) 5 MG tablet Take a tablet 1-2 times daily during the spring/summer to keep the hives [place] with your Allegra daily. 11/05/20  Yes GValentina Shaggy MD  Multiple Vitamin (MULTIVITAMIN) tablet Take 1 tablet by mouth daily.   Yes [provider]  Omega-3 Fatty Acids (FISH OIL) 1000 MG CAPS Take 1,000 mg by mouth daily.    Yes [provider]  rosuvastatin (CRESTOR) 40 MG tablet Take 1 tablet (40 mg total) by mouth daily. Please make overdue appt with Dr. TRadford Paxbefore anymore refills. Thank you 2nd attempt 02/19/21  Yes Turner, TEber Hong MD  triamcinolone ointment (KENALOG) 0.1 % Apply 1 application topically 2 (two) times daily. 06/14/20  Yes GValentina Shaggy MD  levocetirizine (Harlow Ohms 5 MG tablet Take 1 tablet (5 mg total) by mouth every evening. 01/11/20 04/17/20  GValentina Shaggy MD   Social History   Socioeconomic History   Marital status: Married    Spouse  name: Not on file   Number of children: Not on file   Years of education: Not on file   Highest education level: Not on file  Occupational History   Not on file  Tobacco Use   Smoking status: Never   Smokeless tobacco: Never  Vaping Use   Vaping Use: Never used  Substance and Sexual Activity   Alcohol use: Yes    Comment: rare   Drug use: No   Sexual activity: Yes  Other Topics Concern   Not on file  Social History  Narrative   Not on file   Social Determinants of Health   Financial Resource Strain: Not on file  Food Insecurity: Not on file  Transportation Needs: Not on file  Physical Activity: Not on file  Stress: Not on file  Social Connections: Not on file  Intimate Partner Violence: Not on file    Review of Systems Per HPI  Objective:   Vitals:   02/20/21 1002  BP: 120/70  Pulse: 64  Resp: 16  Temp: 98.1 F (36.7 C)  TempSrc: Temporal  SpO2: 95%  Weight: 153 lb (69.4 kg)  Height: 5' 2"  (1.575 m)     Physical Exam Vitals reviewed.  Constitutional:      Appearance: Normal appearance. She is well-developed.  HENT:     Head: Normocephalic and atraumatic.  Eyes:     Conjunctiva/sclera: Conjunctivae normal.     Pupils: Pupils are equal, round, and reactive to light.  Neck:     Vascular: No carotid bruit.     Comments: Enlarged/prominent thyroid without nodules appreciated.  Cardiovascular:     Rate and Rhythm: Normal rate and regular rhythm.     Heart sounds: Normal heart sounds.  Pulmonary:     Effort: Pulmonary effort is normal.     Breath sounds: Normal breath sounds.  Abdominal:     Palpations: Abdomen is soft. There is no pulsatile mass.     Tenderness: There is no abdominal tenderness.  Musculoskeletal:     Cervical back: Tenderness present.     Right lower leg: No edema.     Left lower leg: No edema.  Skin:    General: Skin is warm and dry.  Neurological:     Mental Status: She is alert and oriented to person, place, and time.   Psychiatric:        Mood and Affect: Mood normal.        Behavior: Behavior normal.    Assessment & Plan:  MARIALICE NEWKIRK is a 50 y.o. female . Depression, unspecified depression type - Plan: citalopram (CELEXA) 40 MG tablet Other fatigue - Plan: Comprehensive metabolic panel, CBC, TSH Thyromegaly History of COVID-19  -Possible multifactorial fatigue.  Differential includes long COVID, worsening depression, but previous history of reported borderline hypothyroidism, thyromegaly noted and previous ultrasound noted.  Prior history of elevated LFTs but normal on recent testing.  -Try higher dose of citalopram, potential side effects discussed.  Check CMP, CBC, TSH.  Follow-up in 1 month, handout given on managing depression and fatigue.  RTC precautions if worse sooner.  Meds ordered this encounter  Medications   citalopram (CELEXA) 40 MG tablet    Sig: Take 1 tablet (40 mg total) by mouth daily.    Dispense:  30 tablet    Refill:  3   Patient Instructions  Try higher dose of citalopram for now.  I am checking other labs for the fatigue, but that may be due to multiple causes as we discussed including possible previous COVID-19 infection.  Recheck with me in 1 month and we can discuss further work-up if needed at time.  I will let you know if there are concerns on your labs.Return to the clinic or go to the nearest emergency room if any of your symptoms worsen or new symptoms occur.  Fatigue If you have fatigue, you feel tired all the time and have a lack of energy or a lack of motivation. Fatigue may make it difficult to start or complete tasks because of exhaustion.  In general, occasional or mild fatigue is often a normal response to activity or life. However, long-lasting (chronic) or extreme fatigue may be a symptom of a medical condition. Follow these instructions at home: General instructions Watch your fatigue for any changes. Go to bed and get up at the same time every  day. Avoid fatigue by pacing yourself during the day and getting enough sleep at night. Maintain a healthy weight. Medicines Take over-the-counter and prescription medicines only as told by your health care provider. Take a multivitamin, if told by your health care provider.  Do not use herbal or dietary supplements unless they are approved by your health care provider. Activity  Exercise regularly, as told by your health care provider. Use or practice techniques to help you relax, such as yoga, tai chi, meditation, or massage therapy. Eating and drinking  Avoid heavy meals in the evening. Eat a well-balanced diet, which includes lean proteins, whole grains, plenty of fruits and vegetables, and low-fat dairy products. Avoid consuming too much caffeine. Avoid the use of alcohol. Drink enough fluid to keep your urine pale yellow. Lifestyle Change situations that cause you stress. Try to keep your work and personal schedule in balance. Do not use any products that contain nicotine or tobacco, such as cigarettes and e-cigarettes. If you need help quitting, ask your health care provider. Do not use drugs. Contact a health care provider if: Your fatigue does not get better. You have a fever. You suddenly lose or gain weight. You have headaches. You have trouble falling asleep or sleeping through the night. You feel angry, guilty, anxious, or sad. You are unable to have a bowel movement (constipation). Your skin is dry. You have swelling in your legs or another part of your body. Get help right away if: You feel confused. Your vision is blurry. You feel faint or you pass out. You have a severe headache. You have severe pain in your abdomen, your back, or the area between your waist and hips (pelvis). You have chest pain, shortness of breath, or an irregular or fast heartbeat. You are unable to urinate, or you urinate less than normal. You have abnormal bleeding, such as bleeding from  the rectum, vagina, nose, lungs, or nipples. You vomit blood. You have thoughts about hurting yourself or others. If you ever feel like you may hurt yourself or others, or have thoughts about taking your own life, get help right away. You can go to your nearest emergency department or call: Your local emergency services (911 in the [place] A suicide crisis helpline, such as the [facility] at 639-707-2395. This is open 24 hours a day. Summary If you have fatigue, you feel tired all the time and have a lack of energy or a lack of motivation. Fatigue may make it difficult to start or complete tasks because of exhaustion. Long-lasting (chronic) or extreme fatigue may be a symptom of a medical condition. Exercise regularly, as told by your health care provider. Change situations that cause you stress. Try to keep your work and personal schedule in balance. This information is not intended to replace advice given to you by your health care provider. Make sure you discuss any questions you have with your health care provider. Document Revised: 03/14/2020 Document Reviewed: 03/14/2020 Elsevier Patient Education  2022 [place].   Managing Depression, Adult Depression is a mental health condition that affects your thoughts, feelings, and actions. Being diagnosed with depression can bring you relief if you  did not know why you have felt or behaved a certain way. It could also leave you feeling overwhelmed with uncertainty about your future. Preparing yourself to manage your symptoms can help you feel more positive about your future. How to manage lifestyle changes Managing stress Stress is your body's reaction to life changes and events, both good and bad. Stress can add to your feelings of depression. Learning to manage your stress can help lessen your feelings of depression. Try some of the following approaches to reducing your stress (stress reduction  techniques): Listen to music that you enjoy and that inspires you. Try using a meditation app or take a meditation class. Develop a practice that helps you connect with your spiritual self. Walk in nature, pray, or go to a place of worship. Do some deep breathing. To do this, inhale slowly through your nose. Pause at the top of your inhale for a few seconds and then exhale slowly, letting your muscles relax. Practice yoga to help relax and work your muscles. Choose a stress reduction technique that suits your lifestyle and personality. These techniques take time and practice to develop. Set aside 5-15 minutes a day to do them. Therapists can offer training in these techniques. Other things you can do to manage stress include: Keeping a stress diary. Knowing your limits and saying no when you think something is too much. Paying attention to how you react to certain situations. You may not be able to control everything, but you can change your reaction. Adding humor to your life by watching funny films or TV shows. Making time for activities that you enjoy and that relax you.  Medicines Medicines, such as antidepressants, are often a part of treatment for depression. Talk with your pharmacist or health care provider about all the medicines, supplements, and herbal products that you take, their possible side effects, and what medicines and other products are safe to take together. Make sure to report any side effects you may have to your health care provider. Relationships Your health care provider may suggest family therapy, couples therapy, or individual therapy as part of your treatment. How to recognize changes Everyone responds differently to treatment for depression. As you recover from depression, you may start to: Have more interest in doing activities. Feel less hopeless. Have more energy. Overeat less often, or have a better appetite. Have better mental focus. It is important to  recognize if your depression is not getting better or is getting worse. The symptoms you had in the beginning may return, such as: Tiredness (fatigue) or low energy. Eating too much or too little. Sleeping too much or too little. Feeling restless, agitated, or hopeless. Trouble focusing or making decisions. Unexplained physical complaints. Feeling irritable, angry, or aggressive. If you or your family members notice these symptoms coming back, let your health care provider know right away. Follow these instructions at home: Activity  Try to get some form of exercise each day, such as walking, biking, swimming, or lifting weights. Practice stress reduction techniques. Engage your mind by taking a class or doing some volunteer work. Lifestyle Get the right amount and quality of sleep. Cut down on using caffeine, tobacco, alcohol, and other potentially harmful substances. Eat a healthy diet that includes plenty of vegetables, fruits, whole grains, low-fat dairy products, and lean protein. Do not eat a lot of foods that are high in solid fats, added sugars, or salt (sodium). General instructions Take over-the-counter and prescription medicines only as told by your  health care provider. Keep all follow-up visits as told by your health care provider. This is important. Where to find support Talking to others Friends and family members can be sources of support and guidance. Talk to trusted friends or family members about your condition. Explain your symptoms to them, and let them know that you are working with a health care provider to treat your depression. Tell friends and family members how they also can be helpful. Finances Find appropriate mental health providers that fit with your financial situation. Talk with your health care provider about options to get reduced prices on your medicines. Where to find more information You can find support in your area from: Anxiety and Depression  Association of America (ADAA): www.adaa.org Mental Health America: www.mentalhealthamerica.net [employer] on Mental Illness: www.nami.org Contact a health care provider if: You stop taking your antidepressant medicines, and you have any of these symptoms: Nausea. Headache. Light-headedness. Chills and body aches. Not being able to sleep (insomnia). You or your friends and family think your depression is getting worse. Get help right away if: You have thoughts of hurting yourself or others. If you ever feel like you may hurt yourself or others, or have thoughts about taking your own life, get help right away. Go to your nearest emergency department or: Call your local emergency services (911 in the [place] Call a suicide crisis helpline, such as the [facility] at 956 556 4470. This is open 24 hours a day in the [place] Text the Crisis Text Line at 540-531-4666 (in the [place].). Summary If you are diagnosed with depression, preparing yourself to manage your symptoms is a good way to feel positive about your future. Work with your health care provider on a management plan that includes stress reduction techniques, medicines (if applicable), therapy, and healthy lifestyle habits. Keep talking with your health care provider about how your treatment is working. If you have thoughts about taking your own life, call a suicide crisis helpline or text a crisis text line. This information is not intended to replace advice given to you by your health care provider. Make sure you discuss any questions you have with your health care provider. Document Revised: 03/15/2019 Document Reviewed: 03/15/2019 Elsevier Patient Education  2022 [place],   Merri Ray, MD [place] 02/20/21 10:49 AM

## 2021-02-20 NOTE — Patient Instructions (Signed)
Try higher dose of citalopram for now.  I am checking other labs for the fatigue, but that may be due to multiple causes as we discussed including possible previous COVID-19 infection.  Recheck with me in 1 month and we can discuss further work-up if needed at time.  I will let you know if there are concerns on your labs.Return to the clinic or go to the nearest emergency room if any of your symptoms worsen or new symptoms occur.  Fatigue If you have fatigue, you feel tired all the time and have a lack of energy or a lack of motivation. Fatigue may make it difficult to start or complete tasks because of exhaustion. In general, occasional or mild fatigue is often a normal response to activity or life. However, long-lasting (chronic) or extreme fatigue may be a symptom of a medical condition. Follow these instructions at home: General instructions Watch your fatigue for any changes. Go to bed and get up at the same time every day. Avoid fatigue by pacing yourself during the day and getting enough sleep at night. Maintain a healthy weight. Medicines Take over-the-counter and prescription medicines only as told by your health care provider. Take a multivitamin, if told by your health care provider.  Do not use herbal or dietary supplements unless they are approved by your health care provider. Activity  Exercise regularly, as told by your health care provider. Use or practice techniques to help you relax, such as yoga, tai chi, meditation, or massage therapy. Eating and drinking  Avoid heavy meals in the evening. Eat a well-balanced diet, which includes lean proteins, whole grains, plenty of fruits and vegetables, and low-fat dairy products. Avoid consuming too much caffeine. Avoid the use of alcohol. Drink enough fluid to keep your urine pale yellow. Lifestyle Change situations that cause you stress. Try to keep your work and personal schedule in balance. Do not use any products that contain  nicotine or tobacco, such as cigarettes and e-cigarettes. If you need help quitting, ask your health care provider. Do not use drugs. Contact a health care provider if: Your fatigue does not get better. You have a fever. You suddenly lose or gain weight. You have headaches. You have trouble falling asleep or sleeping through the night. You feel angry, guilty, anxious, or sad. You are unable to have a bowel movement (constipation). Your skin is dry. You have swelling in your legs or another part of your body. Get help right away if: You feel confused. Your vision is blurry. You feel faint or you pass out. You have a severe headache. You have severe pain in your abdomen, your back, or the area between your waist and hips (pelvis). You have chest pain, shortness of breath, or an irregular or fast heartbeat. You are unable to urinate, or you urinate less than normal. You have abnormal bleeding, such as bleeding from the rectum, vagina, nose, lungs, or nipples. You vomit blood. You have thoughts about hurting yourself or others. If you ever feel like you may hurt yourself or others, or have thoughts about taking your own life, get help right away. You can go to your nearest emergency department or call: Your local emergency services (911 in the [place] A suicide crisis helpline, such as the [place] at (980)417-3635. This is open 24 hours a day. Summary If you have fatigue, you feel tired all the time and have a lack of energy or a lack of motivation. Fatigue may make it  difficult to start or complete tasks because of exhaustion. Long-lasting (chronic) or extreme fatigue may be a symptom of a medical condition. Exercise regularly, as told by your health care provider. Change situations that cause you stress. Try to keep your work and personal schedule in balance. This information is not intended to replace advice given to you by your health care provider. Make  sure you discuss any questions you have with your health care provider. Document Revised: 03/14/2020 Document Reviewed: 03/14/2020 Elsevier Patient Education  2022 [employer].   Managing Depression, Adult Depression is a mental health condition that affects your thoughts, feelings, and actions. Being diagnosed with depression can bring you relief if you did not know why you have felt or behaved a certain way. It could also leave you feeling overwhelmed with uncertainty about your future. Preparing yourself to manage your symptoms can help you feel more positive about your future. How to manage lifestyle changes Managing stress Stress is your body's reaction to life changes and events, both good and bad. Stress can add to your feelings of depression. Learning to manage your stress can help lessen your feelings of depression. Try some of the following approaches to reducing your stress (stress reduction techniques): Listen to music that you enjoy and that inspires you. Try using a meditation app or take a meditation class. Develop a practice that helps you connect with your spiritual self. Walk in nature, pray, or go to a place of worship. Do some deep breathing. To do this, inhale slowly through your nose. Pause at the top of your inhale for a few seconds and then exhale slowly, letting your muscles relax. Practice yoga to help relax and work your muscles. Choose a stress reduction technique that suits your lifestyle and personality. These techniques take time and practice to develop. Set aside 5-15 minutes a day to do them. Therapists can offer training in these techniques. Other things you can do to manage stress include: Keeping a stress diary. Knowing your limits and saying no when you think something is too much. Paying attention to how you react to certain situations. You may not be able to control everything, but you can change your reaction. Adding humor to your life by watching funny  films or TV shows. Making time for activities that you enjoy and that relax you.  Medicines Medicines, such as antidepressants, are often a part of treatment for depression. Talk with your pharmacist or health care provider about all the medicines, supplements, and herbal products that you take, their possible side effects, and what medicines and other products are safe to take together. Make sure to report any side effects you may have to your health care provider. Relationships Your health care provider may suggest family therapy, couples therapy, or individual therapy as part of your treatment. How to recognize changes Everyone responds differently to treatment for depression. As you recover from depression, you may start to: Have more interest in doing activities. Feel less hopeless. Have more energy. Overeat less often, or have a better appetite. Have better mental focus. It is important to recognize if your depression is not getting better or is getting worse. The symptoms you had in the beginning may return, such as: Tiredness (fatigue) or low energy. Eating too much or too little. Sleeping too much or too little. Feeling restless, agitated, or hopeless. Trouble focusing or making decisions. Unexplained physical complaints. Feeling irritable, angry, or aggressive. If you or your family members notice these symptoms coming back,  let your health care provider know right away. Follow these instructions at home: Activity  Try to get some form of exercise each day, such as walking, biking, swimming, or lifting weights. Practice stress reduction techniques. Engage your mind by taking a class or doing some volunteer work. Lifestyle Get the right amount and quality of sleep. Cut down on using caffeine, tobacco, alcohol, and other potentially harmful substances. Eat a healthy diet that includes plenty of vegetables, fruits, whole grains, low-fat dairy products, and lean protein. Do  not eat a lot of foods that are high in solid fats, added sugars, or salt (sodium). General instructions Take over-the-counter and prescription medicines only as told by your health care provider. Keep all follow-up visits as told by your health care provider. This is important. Where to find support Talking to others Friends and family members can be sources of support and guidance. Talk to trusted friends or family members about your condition. Explain your symptoms to them, and let them know that you are working with a health care provider to treat your depression. Tell friends and family members how they also can be helpful. Finances Find appropriate mental health providers that fit with your financial situation. Talk with your health care provider about options to get reduced prices on your medicines. Where to find more information You can find support in your area from: Anxiety and Depression Association of America (ADAA): www.adaa.org Mental Health America: www.mentalhealthamerica.net [employer] on Mental Illness: www.nami.org Contact a health care provider if: You stop taking your antidepressant medicines, and you have any of these symptoms: Nausea. Headache. Light-headedness. Chills and body aches. Not being able to sleep (insomnia). You or your friends and family think your depression is getting worse. Get help right away if: You have thoughts of hurting yourself or others. If you ever feel like you may hurt yourself or others, or have thoughts about taking your own life, get help right away. Go to your nearest emergency department or: Call your local emergency services (911 in the [place] Call a suicide crisis helpline, such as the [place] at 8201427339. This is open 24 hours a day in the [place] Text the Crisis Text Line at 930 740 3164 (in the [place].). Summary If you are diagnosed with depression, preparing yourself to manage your symptoms is a  good way to feel positive about your future. Work with your health care provider on a management plan that includes stress reduction techniques, medicines (if applicable), therapy, and healthy lifestyle habits. Keep talking with your health care provider about how your treatment is working. If you have thoughts about taking your own life, call a suicide crisis helpline or text a crisis text line. This information is not intended to replace advice given to you by your health care provider. Make sure you discuss any questions you have with your health care provider. Document Revised: 03/15/2019 Document Reviewed: 03/15/2019 Elsevier Patient Education  2022 [employer].

## 2021-03-02 ENCOUNTER — Other Ambulatory Visit: Payer: Self-pay | Admitting: Cardiology

## 2021-03-02 DIAGNOSIS — E7800 Pure hypercholesterolemia, unspecified: Secondary | ICD-10-CM

## 2021-03-04 ENCOUNTER — Ambulatory Visit (HOSPITAL_COMMUNITY)
Admission: RE | Admit: 2021-03-04 | Discharge: 2021-03-04 | Disposition: A | Discharge status: Home / Self Care | Payer: 59 | Source: Ambulatory Visit | Attending: Cardiology | Admitting: Cardiology

## 2021-03-04 ENCOUNTER — Encounter: Payer: Self-pay | Admitting: Cardiology

## 2021-03-04 ENCOUNTER — Other Ambulatory Visit: Payer: Self-pay

## 2021-03-04 DIAGNOSIS — I728 Aneurysm of other specified arteries: Secondary | ICD-10-CM | POA: Insufficient documentation

## 2021-03-04 DIAGNOSIS — I712 Thoracic aortic aneurysm, without rupture, unspecified: Secondary | ICD-10-CM

## 2021-03-04 MED ORDER — GADOBUTROL 1 MMOL/ML IV SOLN
7.0000 mL | Freq: Once | INTRAVENOUS | Status: AC | PRN
  Administered 2021-03-04: 7 mL via INTRAVENOUS

## 2021-03-17 ENCOUNTER — Other Ambulatory Visit: Payer: Self-pay | Admitting: Cardiology

## 2021-03-17 ENCOUNTER — Other Ambulatory Visit: Payer: Self-pay | Admitting: Family Medicine

## 2021-03-17 DIAGNOSIS — E7800 Pure hypercholesterolemia, unspecified: Secondary | ICD-10-CM

## 2021-03-17 DIAGNOSIS — F32A Depression, unspecified: Secondary | ICD-10-CM

## 2021-03-17 NOTE — Telephone Encounter (Signed)
Please advise 

## 2021-03-25 ENCOUNTER — Encounter: Payer: Self-pay | Admitting: Family Medicine

## 2021-03-25 ENCOUNTER — Ambulatory Visit (INDEPENDENT_AMBULATORY_CARE_PROVIDER_SITE_OTHER): Payer: 59 | Admitting: Family Medicine

## 2021-03-25 VITALS — BP 124/82 | HR 66 | Temp 97.7°F | Resp 16 | Wt 153.6 lb

## 2021-03-25 DIAGNOSIS — F341 Dysthymic disorder: Secondary | ICD-10-CM | POA: Diagnosis not present

## 2021-03-25 MED ORDER — BUPROPION HCL ER (XL) 150 MG PO TB24: 150.0000 mg | ORAL_TABLET | Freq: Every day | ORAL | 3 refills | Status: DC

## 2021-03-25 MED ORDER — CITALOPRAM HYDROBROMIDE 20 MG PO TABS
20.0000 mg | ORAL_TABLET | Freq: Every day | ORAL | 1 refills | Status: DC
Start: 2021-03-25 — End: 2021-08-25

## 2021-03-25 NOTE — Assessment & Plan Note (Signed)
Deteriorated.  Pt had been doing well on Citalopram 20mg  daily for quite awhile but recent stressors and changes have caused sxs to worsen.  Unfortunately increasing Citalopram to 40mg  daily did nothing for mood and only made patient more tired.  Will decrease back to 20mg  daily and add Wellbutrin to improve both mood and hopefully energy.  Will continue to follow closely.  Pt expressed understanding and is in agreement w/ plan.

## 2021-03-25 NOTE — Progress Notes (Signed)
   Subjective:    Patient ID: Ann Bass, female    DOB: 01-16-1971, 50 y.o.   MRN: [id number]  HPI Depression- at last visit pt's PHQ=12 and her Citalopram was increased to 40mg  daily.  Today PHQ is actually worse at 33.  'i just don't feel that great'.  'super tired and cloudy all the time'.  Pt feels overwhelmed.  No improvement w/ increase in Citalopram- feels she is sleeping more.  Is interested in adding medication at this time.    Review of Systems For ROS see HPI   This visit occurred during the SARS-CoV-2 public health emergency.  Safety protocols were in place, including screening questions prior to the visit, additional usage of staff PPE, and extensive cleaning of exam room while observing appropriate contact time as indicated for disinfecting solutions.      Objective:   Physical Exam Vitals reviewed.  Constitutional:      General: She is not in acute distress.    Appearance: Normal appearance. She is not ill-appearing.  HENT:     Head: Normocephalic and atraumatic.  Eyes:     Extraocular Movements: Extraocular movements intact.     Conjunctiva/sclera: Conjunctivae normal.     Pupils: Pupils are equal, round, and reactive to light.  Skin:    General: Skin is warm and dry.  Neurological:     General: No focal deficit present.     Mental Status: She is alert and oriented to person, place, and time.  Psychiatric:        Behavior: Behavior normal.        Thought Content: Thought content normal.        Judgment: Judgment normal.     Comments: Flat affect          Assessment & Plan:

## 2021-03-25 NOTE — Patient Instructions (Signed)
Follow up in 4-6 weeks to recheck mood and energy Go back to the 20mg  of Citalopram (I sent in a new prescription) ADD the Wellbutrin 150mg  once daily (in the morning) Continue to work on stress management, self care, and take time for things you enjoy Call with any questions or concerns Happy Thanksgiving!

## 2021-04-16 ENCOUNTER — Other Ambulatory Visit: Payer: Self-pay | Admitting: Family Medicine

## 2021-04-16 ENCOUNTER — Other Ambulatory Visit: Payer: Self-pay | Admitting: Cardiology

## 2021-04-16 DIAGNOSIS — E7800 Pure hypercholesterolemia, unspecified: Secondary | ICD-10-CM

## 2021-04-30 ENCOUNTER — Other Ambulatory Visit: Payer: Self-pay | Admitting: Cardiology

## 2021-04-30 DIAGNOSIS — E7800 Pure hypercholesterolemia, unspecified: Secondary | ICD-10-CM

## 2021-05-06 ENCOUNTER — Encounter: Payer: Self-pay | Admitting: Family Medicine

## 2021-05-06 ENCOUNTER — Ambulatory Visit (INDEPENDENT_AMBULATORY_CARE_PROVIDER_SITE_OTHER): Payer: 59 | Admitting: Family Medicine

## 2021-05-06 VITALS — BP 120/88 | HR 69 | Temp 97.6°F | Resp 16 | Wt 147.8 lb

## 2021-05-06 DIAGNOSIS — F341 Dysthymic disorder: Secondary | ICD-10-CM

## 2021-05-06 DIAGNOSIS — Z23 Encounter for immunization: Secondary | ICD-10-CM | POA: Diagnosis not present

## 2021-05-06 MED ORDER — BUPROPION HCL ER (XL) 300 MG PO TB24: 300.0000 mg | ORAL_TABLET | Freq: Every day | ORAL | 3 refills | Status: DC

## 2021-05-06 NOTE — Progress Notes (Signed)
° °  Subjective:    Patient ID: Ann Bass, female    DOB: 1971/03/13, 50 y.o.   MRN: [id number]  HPI Depression- at last visit we decreased her Citalopram back to 20mg  and added Wellbutrin 150mg .  Pt reports feeling better since  changing medications.  Pt feels like she has been napping less.  Pt notes improved motivation w/ addition of Wellbutrin.  PHQ went from 14 --> 8.  'i've been pretty happy'.  Continues to have some brain fog- but this has improved 'a little bit' w/ med changes.     Review of Systems For ROS see HPI   This visit occurred during the SARS-CoV-2 public health emergency.  Safety protocols were in place, including screening questions prior to the visit, additional usage of staff PPE, and extensive cleaning of exam room while observing appropriate contact time as indicated for disinfecting solutions.      Objective:   Physical Exam Vitals reviewed.  Constitutional:      General: She is not in acute distress.    Appearance: Normal appearance. She is not ill-appearing.  HENT:     Head: Normocephalic and atraumatic.  Eyes:     Extraocular Movements: Extraocular movements intact.     Conjunctiva/sclera: Conjunctivae normal.     Pupils: Pupils are equal, round, and reactive to light.  Cardiovascular:     Rate and Rhythm: Normal rate.  Pulmonary:     Effort: Pulmonary effort is normal.  Skin:    General: Skin is warm and dry.  Neurological:     General: No focal deficit present.     Mental Status: She is alert and oriented to person, place, and time.  Psychiatric:        Mood and Affect: Mood normal.        Behavior: Behavior normal.        Thought Content: Thought content normal.          Assessment & Plan:

## 2021-05-06 NOTE — Assessment & Plan Note (Signed)
Improved.  Pt feels that she is doing better w/ the decreased dose of Citalopram and the addition of Wellbutrin.  She feels that there is still room for improvement so will increase Wellbutrin to 300mg  daily.  Pt expressed understanding and is in agreement w/ plan.

## 2021-05-06 NOTE — Addendum Note (Signed)
Addended by: Juliann Pulse on: 05/06/2021 09:58 AM   Modules accepted: Orders

## 2021-05-06 NOTE — Patient Instructions (Signed)
Follow up by MyChart in 6-8 weeks to let me know how the new dose is working INCREASE the Wellbutrin to 300mg  daily- 2 of what you have at home and 1 of the new prescription CONTINUE the Citalopram at the 20mg  dose Call with any questions or concerns Stay Safe!  Stay Healthy! Happy Holidays!!

## 2021-05-29 ENCOUNTER — Telehealth: Payer: Self-pay | Admitting: Cardiology

## 2021-05-29 DIAGNOSIS — E7800 Pure hypercholesterolemia, unspecified: Secondary | ICD-10-CM

## 2021-05-29 MED ORDER — FENOFIBRATE 160 MG PO TABS: 160.0000 mg | ORAL_TABLET | Freq: Every day | ORAL | 0 refills | Status: DC

## 2021-05-29 NOTE — Telephone Encounter (Signed)
Pt has been scheduled to see Dr. Radford Pax in March, 2023.  [facility]

## 2021-05-29 NOTE — Telephone Encounter (Signed)
Patient completely out of medication.

## 2021-05-29 NOTE — Telephone Encounter (Signed)
°*  STAT* If patient is at the pharmacy, call can be transferred to refill team.   1. Which medications need to be refilled? (please list name of each medication and dose if known)   fenofibrate 160 MG tablet    2. Which pharmacy/location (including street and city if local pharmacy) is medication to be sent to? [id number] - [place],  - [address]  3. Do they need a 30 day or 90 day supply? 90  Pt scheduled to see Dr. Radford Pax on  07/23/21

## 2021-06-04 ENCOUNTER — Other Ambulatory Visit: Payer: Self-pay | Admitting: Family Medicine

## 2021-06-09 ENCOUNTER — Encounter: Payer: Self-pay | Admitting: Family Medicine

## 2021-06-09 ENCOUNTER — Telehealth (INDEPENDENT_AMBULATORY_CARE_PROVIDER_SITE_OTHER): Payer: 59 | Admitting: Family Medicine

## 2021-06-09 VITALS — Ht 62.0 in | Wt 148.0 lb

## 2021-06-09 DIAGNOSIS — R0982 Postnasal drip: Secondary | ICD-10-CM | POA: Diagnosis not present

## 2021-06-09 DIAGNOSIS — R052 Subacute cough: Secondary | ICD-10-CM | POA: Diagnosis not present

## 2021-06-09 DIAGNOSIS — R0981 Nasal congestion: Secondary | ICD-10-CM

## 2021-06-09 MED ORDER — BENZONATATE 100 MG PO CAPS
100.0000 mg | ORAL_CAPSULE | Freq: Three times a day (TID) | ORAL | 0 refills | Status: DC | PRN
Start: 2021-06-09 — End: 2021-07-14

## 2021-06-09 MED ORDER — GUAIFENESIN-CODEINE 100-10 MG/5ML PO SYRP: 5.0000 mL | ORAL_SOLUTION | Freq: Every evening | ORAL | 0 refills | Status: DC | PRN

## 2021-06-09 MED ORDER — IPRATROPIUM BROMIDE 0.06 % NA SOLN: 1.0000 | Freq: Four times a day (QID) | NASAL | 5 refills | Status: DC

## 2021-06-09 NOTE — Progress Notes (Signed)
Virtual Visit via Video Note  I connected with Ann Bass on 06/09/21 at 4:57 PM by a video enabled telemedicine application and verified that I am speaking with the correct person using two identifiers.  Patient location: home My location: office - [place]   I discussed the limitations, risks, security and privacy concerns of performing an evaluation and management service by telephone and the availability of in person appointments. I also discussed with the patient that there may be a patient responsible charge related to this service. The patient expressed understanding and agreed to proceed, consent obtained  Chief complaint: Chief Complaint  Patient presents with   Cough    For over 1 month & cannot get over cough. Never tested for Covid.    History of Present Illness: Ann Bass is a 51 y.o. female  Cough: Past month.  Cold sx's at Christmas - lasted a week. Cough with drainage, PND - wakes up at night. More cough at night - worse, more episodes during day. Feels dry. Clear drainage.  No fever.  No night sweats.  No dyspnea, no wheeze.  Some nasal congestion - may be affecting hearing.  No chest pains.   TX: mucinex DM, increased allergy meds\ (xyzal at night - stopped as no change and caused sedation, allegra during day), flonase - started 2 days ago. 1spr/nostril QD. nasal irrigation.   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[facility])    SOB (shortness of breath) 02/18/2017   Excessive daytime sleepiness 02/18/2017   Vitamin D deficiency 09/17/2016   Physical exam 09/13/2014   Palpitations 09/13/2014   Decreased hearing of both ears  09/13/2014   Glucose intolerance (impaired glucose tolerance) 02/22/2013   Depression 02/22/2013   Hypothyroid 02/22/2013   Past Medical History:  Diagnosis Date   Allergy    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  Authorizing Provider  Ascorbic Acid (VITAMIN C) 1000 MG tablet Take 1,000 mg by mouth daily.   Yes [provider]  buPROPion (WELLBUTRIN XL) 300 MG 24 hr tablet TAKE 1 TABLET BY MOUTH EVERY DAY 06/04/21  Yes [name], MD  cholecalciferol (VITAMIN D3) 25 MCG (1000 UNIT) tablet Take 1,000 Units by mouth daily.   Yes [provider]  citalopram (CELEXA) 20 MG tablet Take 1 tablet (20 mg total) by mouth daily. 03/25/21  Yes [name], MD  EPINEPHrine 0.3 mg/0.3 mL IJ SOAJ injection INJECT 0.3 MG INTO THE MUSCLE ONCE FOR 1 DOSE. 08/20/20  Yes  [provider]  fenofibrate 160 MG tablet Take 1 tablet (160 mg total) by mouth daily. Please make overdue appt with Dr. Radford Pax before anymore refills. Thank you 2nd attempt 05/29/21  Yes Turner, Eber Hong, MD  fexofenadine (ALLEGRA) 180 MG tablet Take 180 mg by mouth daily.   Yes [provider]  levocetirizine (XYZAL) 5 MG tablet Take a tablet 1-2 times daily during the spring/summer to keep the hives at [place] in combination with your Allegra daily. 11/05/20  Yes Valentina Shaggy, MD  Multiple Vitamin (MULTIVITAMIN) tablet Take 1 tablet by mouth daily.   Yes [provider]  Omega-3 Fatty Acids (FISH OIL) 1000 MG CAPS Take 1,000 mg by mouth daily.    Yes [provider]  rosuvastatin (CRESTOR) 40 MG tablet TAKE 1 TABLET BY MOUTH DAILY. PLEASE MAKE OVERDUE APPT WITH DR. Radford Pax BEFORE ANYMORE REFILLS. THANK YOU 3RD AND FINAL ATTEMPT 03/17/21  Yes Turner, Eber Hong, MD  triamcinolone ointment (KENALOG) 0.1 % Apply 1 application topically 2 (two) times daily. Patient not taking: Reported on 06/09/2021 06/14/20   Valentina Shaggy, MD   Social History   Socioeconomic History   Marital status: Married    Spouse name: Not on file   Number of children: Not on file   Years of education: Not on file   Highest education level: Not on file  Occupational History   Not on file  Tobacco Use   Smoking status: Never   Smokeless tobacco: Never  Vaping Use   Vaping Use: Never used  Substance and Sexual Activity   Alcohol use: Yes    Comment: rare   Drug use: No   Sexual activity: Yes  Other Topics Concern   Not on file  Social History Narrative   Not on file   Social Determinants of Health   Financial Resource Strain: Not on file  Food Insecurity: Not on file  Transportation Needs: Not on file  Physical Activity: Not on file  Stress: Not on file  Social Connections: Not on file  Intimate Partner Violence: Not on file     Observations/Objective: Nontoxic appearance on video.  Speaking in full sentences, no respiratory distress.  Euthymic mood.  All questions answered with understanding of plan expressed.  Assessment and Plan: Subacute cough - Plan: benzonatate (TESSALON) 100 MG capsule, guaiFENesin-codeine (ROBITUSSIN AC) 100-10 MG/5ML syrup  Nasal congestion - Plan: ipratropium (ATROVENT) 0.06 % nasal spray  PND (post-nasal drip) - Plan: ipratropium (ATROVENT) 0.06 % nasal spray Persistent cough, but no fever, dyspnea, wheezing, unlikely reactive airway/asthma.  Less likely bacterial infection without fever or discolored phlegm, primarily upper airway cough irritation with likely postnasal drip.  -Continue Flonase, may not see relief just quite yet.  -Add Atrovent nasal spray, potential side effects discussed  -Tessalon Perles 3 times daily as needed with option of codeine  cough syrup at night  -Update in the next 1 week, consider antibiotic versus imaging if persistent symptoms.  RTC/ER precautions.  Follow Up Instructions: As needed.    I discussed the assessment and treatment plan with the patient. The patient was provided an opportunity to ask questions and all were answered. The patient agreed with the plan and demonstrated an understanding of the instructions.   The patient was advised to call back or seek an in-person evaluation if the symptoms worsen or if the condition fails to improve as anticipated.   Wendie Agreste, MD

## 2021-06-09 NOTE — Patient Instructions (Addendum)
Stay on flonase, can add atrovent nasal spray if needed for congestion.  Tessalon perles for cough during the day. Codeine cough syrup at night if needed.  Send me an update later this week. If not improving, can consider antibiotic at that time, but less likely bacterial infection.  Hope you feel better soon!

## 2021-06-12 ENCOUNTER — Encounter: Payer: Self-pay | Admitting: Family Medicine

## 2021-06-12 DIAGNOSIS — R052 Subacute cough: Secondary | ICD-10-CM

## 2021-06-12 MED ORDER — AZITHROMYCIN 250 MG PO TABS: ORAL_TABLET | ORAL | 0 refills | Status: AC

## 2021-06-12 MED ORDER — HYDROCODONE BIT-HOMATROP MBR 5-1.5 MG/5ML PO SOLN: ORAL | 0 refills | Status: DC

## 2021-06-12 NOTE — Telephone Encounter (Signed)
Pt is following up due to persistent cough, reports has worsened causing vomiting at times, and no relief from Codeine syrup, some short term relief from Tessalon   Please advise

## 2021-06-12 NOTE — Telephone Encounter (Signed)
Called patient.  Head pressure, more congestion. No discoloration. More headache this am.no fever.  Has taken sudafed in past without difficulty. No recent use. Used decongestant nasal spray few weeks ago for 1-2 days.  Cough to point of vomiting at times. ?coughing fit- barking sound.   Possible secondary sinus infection with headache, head pressure, but coughing fits with posttussive emesis suggest possible pertussis in differential.  Start azithromycin.  Hydrocodone cough syrup if needed at night, continue Tessalon Perles.  Can try temporary Afrin nasal spray/decongestant nasal spray temporarily for 1 to 2 days if helpful.  Update on symptoms next 72 hours.  Urgent care precautions if any worsening

## 2021-06-16 MED ORDER — HYDROCODONE BIT-HOMATROP MBR 5-1.5 MG/5ML PO SOLN: ORAL | 0 refills | Status: DC

## 2021-06-16 NOTE — Addendum Note (Signed)
Addended by: Merri Ray R on: 06/16/2021 11:29 AM   Modules accepted: Orders

## 2021-06-16 NOTE — Telephone Encounter (Signed)
Pt reports you were going to send another cough suppressant but this is not at the pharmacy can you please review

## 2021-06-18 ENCOUNTER — Ambulatory Visit (INDEPENDENT_AMBULATORY_CARE_PROVIDER_SITE_OTHER)
Admission: RE | Admit: 2021-06-18 | Discharge: 2021-06-18 | Disposition: A | Discharge status: Home / Self Care | Payer: 59 | Source: Ambulatory Visit | Attending: Family Medicine | Admitting: Family Medicine

## 2021-06-18 ENCOUNTER — Ambulatory Visit (INDEPENDENT_AMBULATORY_CARE_PROVIDER_SITE_OTHER): Payer: 59 | Admitting: Family Medicine

## 2021-06-18 ENCOUNTER — Other Ambulatory Visit: Payer: Self-pay

## 2021-06-18 ENCOUNTER — Encounter: Payer: Self-pay | Admitting: Family Medicine

## 2021-06-18 VITALS — BP 128/70 | HR 77 | Temp 97.9°F | Resp 14 | Ht 62.0 in | Wt 147.2 lb

## 2021-06-18 DIAGNOSIS — R052 Subacute cough: Secondary | ICD-10-CM | POA: Diagnosis not present

## 2021-06-18 MED ORDER — PREDNISONE 20 MG PO TABS: 40.0000 mg | ORAL_TABLET | Freq: Every day | ORAL | 0 refills | Status: DC

## 2021-06-18 NOTE — Patient Instructions (Signed)
Exam was reassuring today.  Can try prednisone for few days to see if that will help.  Please have x-ray performed today at [facility].  Continue hydrocodone cough syrup at night.  Continue nasal sprays for now.  Can also try the albuterol inhaler during coughing fits to see if that will help.  Although less likely, can also take Pepcid or Zantac to see if there may be a heartburn component that is irritating the upper airway. Give me an update in next few days.  Hang in there.  Return to the clinic or go to the nearest emergency room if any of your symptoms worsen or new symptoms occur.   Cough, Adult Coughing is a reflex that clears your throat and your airways (respiratory system). Coughing helps to heal and protect your lungs. It is normal to cough occasionally, but a cough that happens with other symptoms or lasts a long time may be a sign of a condition that needs treatment. An acute cough may only last 2-3 weeks, while a chronic cough may last 8 or more weeks. Coughing is commonly caused by: Infection of the respiratory systemby viruses or bacteria. Breathing in substances that irritate your lungs. Allergies. Asthma. Mucus that runs down the back of your throat (postnasal drip). Smoking. Acid backing up from the stomach into the esophagus (gastroesophageal reflux). Certain medicines. Chronic lung problems. Other medical conditions such as heart failure or a blood clot in the lung (pulmonary embolism). Follow these instructions at home: Medicines Take over-the-counter and prescription medicines only as told by your health care provider. Talk with your health care provider before you take a cough suppressant medicine. Lifestyle  Avoid cigarette smoke. Do not use any products that contain nicotine or tobacco, such as cigarettes, e-cigarettes, and chewing tobacco. If you need help quitting, ask your health care provider. Drink enough fluid to keep your urine pale yellow. Avoid  caffeine. Do not drink alcohol if your health care provider tells you not to drink. General instructions  Pay close attention to changes in your cough. Tell your health care provider about them. Always cover your mouth when you cough. Avoid things that make you cough, such as perfume, candles, cleaning products, or campfire or tobacco smoke. If the air is dry, use a cool mist vaporizer or humidifier in your bedroom or your home to help loosen secretions. If your cough is worse at night, try to sleep in a semi-upright position. Rest as needed. Keep all follow-up visits as told by your health care provider. This is important. Contact a health care provider if you: Have new symptoms. Cough up pus. Have a cough that does not get better after 2-3 weeks or gets worse. Cannot control your cough with cough suppressant medicines and you are losing sleep. Have pain that gets worse or pain that is not helped with medicine. Have a fever. Have unexplained weight loss. Have night sweats. Get help right away if: You cough up blood. You have difficulty breathing. Your heartbeat is very fast. These symptoms may represent a serious problem that is an emergency. Do not wait to see if the symptoms will go away. Get medical help right away. Call your local emergency services (911 in the [place] Do not drive yourself to the hospital. Summary Coughing is a reflex that clears your throat and your airways. It is normal to cough occasionally, but a cough that happens with other symptoms or lasts a long time may be a sign of a condition that  needs treatment. Take over-the-counter and prescription medicines only as told by your health care provider. Always cover your mouth when you cough. Contact a health care provider if you have new symptoms or a cough that does not get better after 2-3 weeks or gets worse. This information is not intended to replace advice given to you by your health care provider. Make sure you  discuss any questions you have with your health care provider. Document Revised: 05/23/2018 Document Reviewed: 05/23/2018 Elsevier Patient Education  [place].

## 2021-06-18 NOTE — Progress Notes (Signed)
Subjective:  Patient ID: Ann Bass, female    DOB: 10/04/1970  Age: 51 y.o. MRN: [id number]  CC:  Chief Complaint  Patient presents with   Cough    Pt here due to no improvement, continues to cough, no new sxs or new concerns per pt    HPI [name] presents for   Cough: Video visit January 23.  Previous cold symptoms starting around Christmas that lasted a week, persistent cough with drainage/postnasal drip waking her up at night.  Episodes during the day had increased.  Some nasal congestion.  Had tried Mucinex DM, Xyzal, Allegra, Flonase few days prior to that visit.  With recent start of Flonase continued same for possible postnasal drip as trigger of cough.  Added Atrovent nasal spray, Tessalon Perles. Update on January 26th, cough was persistent.  Cough to the point of vomiting with possible coughing fits/barking sound.  Treated with azithromycin for possible pertussis, added hydrocodone cough syrup.  Unfortunately that was printed, reordered yesterday.  Option of temporary Afrin if needed for nasal congestion.  Tried hydrocodone cough syrup past 2 nights with minimal relief.  No fever. Short of breath with coughing fit only. Last posttussive emesis 3 days ago.  Last dose abx yesterday.  Tessalon perles once per day - not much relief.  Cough about the same. Daughter sick today.  Covid test yesterday was negative.  Unsure if wheezing. Has been given inhaler, but no true asthma diagnosis- has not tried.  Flonase daily, atrovent daily - not feeling congested. Head pressure better.    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[facility])     SOB (shortness of breath) 02/18/2017   Excessive daytime sleepiness 02/18/2017   Vitamin D deficiency 09/17/2016   Physical exam 09/13/2014   Palpitations 09/13/2014   Decreased hearing of both ears 09/13/2014   Glucose intolerance (impaired glucose tolerance) 02/22/2013   Depression 02/22/2013   Hypothyroid 02/22/2013   Past Medical History:  Diagnosis Date   Allergy    Ascending aorta dilatation ([place])    56mm on MRI 02/2021   Depression    post-partum   Dilated aortic root ([facility])    27mm by chest MRI 01/2020   Dyspnea    Dysrhythmia    occational   History of gestational diabetes    Hyperlipidemia    Hypertension    Hypothyroidism    Inflammatory polyps of colon ([place])    Liver disease    OSA (obstructive sleep apnea) 09/02/2017   Mild OSA with an AHI of 6.8/h.  On CPAP   Palpitations    PACs noted on event monitor   Pre-diabetes    updated epic hx 08-23-2019 per pt she is pre-diabetic   Splenic artery aneurysm ([place])    66mm on MRI 02/2021   Past Surgical History:  Procedure Laterality Date   broken leg  1982   Left Femur, traction and pin in leg   COLONOSCOPY  2018   ROBOTIC ASSISTED LAPAROSCOPIC HYSTERECTOMY AND SALPINGECTOMY Bilateral 08/24/2019   Procedure: XI ROBOTIC ASSISTED LAPAROSCOPIC HYSTERECTOMY AND SALPINGECTOMY;  Surgeon: Delsa Bern, MD;  Location: [facility];  Service: Gynecology;  Laterality: Bilateral;   WISDOM TOOTH EXTRACTION  1998   Allergies  Allergen Reactions   Penicillins Rash   Prior to Admission medications   Medication Sig Start Date End Date Taking? Authorizing Provider  Ascorbic Acid (VITAMIN C) 1000 MG tablet Take 1,000 mg by mouth daily.   Yes [provider]  benzonatate (TESSALON) 100 MG capsule Take 1 capsule (100 mg total) by mouth 3 (three) times daily as needed for cough. 06/09/21  Yes Wendie Agreste, MD  buPROPion (WELLBUTRIN XL) 300 MG 24 hr tablet TAKE 1 TABLET BY MOUTH EVERY DAY 06/04/21  Yes  [name], MD  cholecalciferol (VITAMIN D3) 25 MCG (1000 UNIT) tablet Take 1,000 Units by mouth daily.   Yes [provider]  citalopram (CELEXA) 20 MG tablet Take 1 tablet (20 mg total) by mouth daily. 03/25/21  Yes [name], MD  EPINEPHrine 0.3 mg/0.3 mL IJ SOAJ injection INJECT 0.3 MG INTO THE MUSCLE ONCE FOR 1 DOSE. 08/20/20  Yes [provider]  fenofibrate 160 MG tablet Take 1 tablet (160 mg total) by mouth daily. Please make overdue appt with Dr. Radford Pax before anymore refills. Thank you 2nd attempt 05/29/21  Yes Turner, Eber Hong, MD  fexofenadine (ALLEGRA) 180 MG tablet Take 180 mg by mouth daily.   Yes [provider]  HYDROcodone bit-homatropine (HYCODAN) 5-1.5 MG/5ML syrup 22m by mouth a bedtime as needed for cough. 06/16/21  Yes Wendie Agreste, MD  ipratropium (ATROVENT) 0.06 % nasal spray Place 1-2 sprays into both nostrils 4 (four) times daily. As needed for nasal congestion 06/09/21  Yes Wendie Agreste, MD  levocetirizine (XYZAL) 5 MG tablet Take a tablet 1-2 times daily during the spring/summer to keep the hives at [place] in combination with your Allegra daily. 11/05/20  Yes Valentina Shaggy, MD  Multiple Vitamin (MULTIVITAMIN) tablet Take 1 tablet by mouth daily.   Yes [provider]  Omega-3 Fatty Acids (FISH OIL) 1000 MG CAPS Take 1,000 mg by mouth daily.    Yes [provider]  rosuvastatin (CRESTOR) 40 MG tablet TAKE 1 TABLET BY MOUTH DAILY. PLEASE MAKE OVERDUE APPT WITH DR. Radford Pax BEFORE ANYMORE REFILLS. THANK YOU 3RD AND FINAL ATTEMPT 03/17/21  Yes Turner, Eber Hong, MD  triamcinolone ointment (KENALOG) 0.1 % Apply 1 application topically 2 (two) times daily. Patient not taking: Reported on 06/09/2021 06/14/20   Valentina Shaggy, MD   Social History   Socioeconomic History   Marital status: Married    Spouse name: Not on file   Number of children: Not on file   Years of education: Not on file   Highest  education level: Not on file  Occupational History   Not on file  Tobacco Use   Smoking status: Never   Smokeless tobacco: Never  Vaping Use   Vaping Use: Never used  Substance and Sexual Activity   Alcohol use: Yes    Comment: rare   Drug use: No   Sexual activity: Yes  Other Topics Concern   Not on file  Social History Narrative   Not on file   Social Determinants of Health   Financial Resource Strain: Not on file  Food Insecurity: Not on file  Transportation Needs: Not on file  Physical Activity: Not on file  Stress: Not on file  Social Connections: Not on file  Intimate Partner Violence: Not on file    Review of Systems   Objective:   Vitals:   06/18/21  1149  BP: 128/70  Pulse: 77  Resp: 14  Temp: 97.9 F (36.6 C)  TempSrc: Temporal  SpO2: 95%  Weight: 147 lb 3.2 oz (66.8 kg)  Height: 5\' 2"  (1.575 m)    Physical Exam Vitals reviewed.  Constitutional:      General: She is not in acute distress.    Appearance: Normal appearance. She is well-developed. She is not ill-appearing.  HENT:     Head: Normocephalic and atraumatic.     Right Ear: Hearing, tympanic membrane, ear canal and external ear normal.     Left Ear: Hearing, tympanic membrane, ear canal and external ear normal.     Nose: Nose normal.     Mouth/Throat:     Pharynx: No posterior oropharyngeal erythema.  Eyes:     Conjunctiva/sclera: Conjunctivae normal.     Pupils: Pupils are equal, round, and reactive to light.  Cardiovascular:     Rate and Rhythm: Normal rate and regular rhythm.     Heart sounds: Normal heart sounds. No murmur heard. Pulmonary:     Effort: Pulmonary effort is normal. No respiratory distress.     Breath sounds: Normal breath sounds. No wheezing or rhonchi.  Skin:    General: Skin is warm and dry.     Findings: No rash.  Neurological:     Mental Status: She is alert and oriented to person, place, and time.  Psychiatric:        Mood and Affect: Mood normal.         Behavior: Behavior normal.       Assessment & Plan:  KENSLI BOWLEY is a 51 y.o. female . Subacute cough - Plan: DG Chest 2 View, predniSONE (DELTASONE) 20 MG tablet Possible post infectious cough versus postnasal drip/upper airway irritation versus lower respiratory tract infection/pneumonia/bronchitis.  Minimal improvement with previous treatments.  Question reactive airway, will try prednisone 40 mg daily x3 days, has taken prior, potential side effects discussed.  Check chest x-ray.  Just finished azithromycin yesterday.  No new antibiotics at this time.  Continue treatments for possible postnasal drip and option of H2 blocker for possible silent reflux although less likely.  Update on symptoms next few days with ER/RTC precautions given  Meds ordered this encounter  Medications   predniSONE (DELTASONE) 20 MG tablet    Sig: Take 2 tablets (40 mg total) by mouth daily with breakfast.    Dispense:  6 tablet    Refill:  0   Patient Instructions  Exam was reassuring today.  Can try prednisone for few days to see if that will help.  Please have x-ray performed today at [facility].  Continue hydrocodone cough syrup at night.  Continue nasal sprays for now.  Can also try the albuterol inhaler during coughing fits to see if that will help.  Although less likely, can also take Pepcid or Zantac to see if there may be a heartburn component that is irritating the upper airway. Give me an update in next few days.  Hang in there.  Return to the clinic or go to the nearest emergency room if any of your symptoms worsen or new symptoms occur.   Cough, Adult Coughing is a reflex that clears your throat and your airways (respiratory system). Coughing helps to heal and protect your lungs. It is normal to cough occasionally, but a cough that happens with other symptoms or lasts a long time may be a sign of a condition that needs treatment. An acute  cough may only last 2-3 weeks, while a chronic cough  may last 8 or more weeks. Coughing is commonly caused by: Infection of the respiratory systemby viruses or bacteria. Breathing in substances that irritate your lungs. Allergies. Asthma. Mucus that runs down the back of your throat (postnasal drip). Smoking. Acid backing up from the stomach into the esophagus (gastroesophageal reflux). Certain medicines. Chronic lung problems. Other medical conditions such as heart failure or a blood clot in the lung (pulmonary embolism). Follow these instructions at home: Medicines Take over-the-counter and prescription medicines only as told by your health care provider. Talk with your health care provider before you take a cough suppressant medicine. Lifestyle  Avoid cigarette smoke. Do not use any products that contain nicotine or tobacco, such as cigarettes, e-cigarettes, and chewing tobacco. If you need help quitting, ask your health care provider. Drink enough fluid to keep your urine pale yellow. Avoid caffeine. Do not drink alcohol if your health care provider tells you not to drink. General instructions  Pay close attention to changes in your cough. Tell your health care provider about them. Always cover your mouth when you cough. Avoid things that make you cough, such as perfume, candles, cleaning products, or campfire or tobacco smoke. If the air is dry, use a cool mist vaporizer or humidifier in your bedroom or your home to help loosen secretions. If your cough is worse at night, try to sleep in a semi-upright position. Rest as needed. Keep all follow-up visits as told by your health care provider. This is important. Contact a health care provider if you: Have new symptoms. Cough up pus. Have a cough that does not get better after 2-3 weeks or gets worse. Cannot control your cough with cough suppressant medicines and you are losing sleep. Have pain that gets worse or pain that is not helped with medicine. Have a fever. Have  unexplained weight loss. Have night sweats. Get help right away if: You cough up blood. You have difficulty breathing. Your heartbeat is very fast. These symptoms may represent a serious problem that is an emergency. Do not wait to see if the symptoms will go away. Get medical help right away. Call your local emergency services (911 in the [place] Do not drive yourself to the hospital. Summary Coughing is a reflex that clears your throat and your airways. It is normal to cough occasionally, but a cough that happens with other symptoms or lasts a long time may be a sign of a condition that needs treatment. Take over-the-counter and prescription medicines only as told by your health care provider. Always cover your mouth when you cough. Contact a health care provider if you have new symptoms or a cough that does not get better after 2-3 weeks or gets worse. This information is not intended to replace advice given to you by your health care provider. Make sure you discuss any questions you have with your health care provider. Document Revised: 05/23/2018 Document Reviewed: 05/23/2018 Elsevier Patient Education  2022 [place],   Merri Ray, MD [place] 06/18/21 12:43 PM

## 2021-06-19 ENCOUNTER — Other Ambulatory Visit: Payer: Self-pay | Admitting: Family Medicine

## 2021-06-19 DIAGNOSIS — R052 Subacute cough: Secondary | ICD-10-CM

## 2021-06-19 DIAGNOSIS — J90 Pleural effusion, not elsewhere classified: Secondary | ICD-10-CM

## 2021-06-19 NOTE — Progress Notes (Signed)
See prior x-ray results.  Minimal pleural effusion bilaterally, repeat chest x-ray on 06/23/21

## 2021-07-03 NOTE — Telephone Encounter (Signed)
Pt reports cough has returned should she have another evaluation?

## 2021-07-03 NOTE — Telephone Encounter (Addendum)
See last chest x-ray, I had ordered a repeat chest x-ray to make sure the pleural effusion had really resolved.   Cough gradually increasing past 4 days. Some coughing fits with vomiting at times.  No fever. Shortness of breath during cough only, no chest pain. No dyspnea.  Advised her to perform home covid test tonight, have CXR performed early tomorrow morning then in office eval  - ok to add to my schedule tomorrow at 12 if possible.

## 2021-07-04 ENCOUNTER — Ambulatory Visit (INDEPENDENT_AMBULATORY_CARE_PROVIDER_SITE_OTHER)
Admission: RE | Admit: 2021-07-04 | Discharge: 2021-07-04 | Disposition: A | Discharge status: Home / Self Care | Payer: 59 | Source: Ambulatory Visit | Attending: Family Medicine | Admitting: Family Medicine

## 2021-07-04 ENCOUNTER — Ambulatory Visit (INDEPENDENT_AMBULATORY_CARE_PROVIDER_SITE_OTHER): Payer: 59 | Admitting: Family Medicine

## 2021-07-04 ENCOUNTER — Other Ambulatory Visit: Payer: Self-pay

## 2021-07-04 ENCOUNTER — Encounter: Payer: Self-pay | Admitting: Family Medicine

## 2021-07-04 VITALS — BP 118/76 | HR 78 | Temp 97.8°F | Resp 16 | Ht 62.0 in | Wt 148.2 lb

## 2021-07-04 DIAGNOSIS — J90 Pleural effusion, not elsewhere classified: Secondary | ICD-10-CM

## 2021-07-04 DIAGNOSIS — R1110 Vomiting, unspecified: Secondary | ICD-10-CM | POA: Diagnosis not present

## 2021-07-04 DIAGNOSIS — R059 Cough, unspecified: Secondary | ICD-10-CM | POA: Diagnosis not present

## 2021-07-04 DIAGNOSIS — R052 Subacute cough: Secondary | ICD-10-CM

## 2021-07-04 MED ORDER — PULMICORT FLEXHALER 90 MCG/ACT IN AEPB: 2.0000 | INHALATION_SPRAY | Freq: Two times a day (BID) | RESPIRATORY_TRACT | 2 refills | Status: DC

## 2021-07-04 NOTE — Patient Instructions (Addendum)
X-ray was reassuring this morning.  Still may be a component of reactive airway or asthma.  Since prednisone helped we can try steroid inhaler 2 puffs twice per day. Can also recurrent cough, possible superimposed viral infections with recurrent cough bivalent.  Try zantac or pepcid for possible heartburn cause. Give me an update on symptoms in next few days.  Return to the clinic or go to the nearest emergency room if any of your symptoms worsen or new symptoms occur.

## 2021-07-04 NOTE — Progress Notes (Signed)
Subjective:  Patient ID: Ann Bass, female    DOB: Jun 05, 1970  Age: 51 y.o. MRN: [id number]  CC:  Chief Complaint  Patient presents with   Cough    Was sick just before christmas, since that time she has had continued cough, pt reports dry cough, reports worst at night     HPI [name] presents for  Cough: See recent visits, initially January 23 video visit.  Cold symptoms started around Christmas with some persistent cough, postnasal drip.  Tried multiple different approaches and had recently started Flonase at that time.  We added Atrovent nasal spray and Tessalon Perles.  Cough had persisted 3 days later, and a cough with posttussive emesis, coughing fits and barking sound.  Treated with azithromycin for possible pertussis and added hydrocodone cough syrup.  Minimal change in symptoms.  Still had cough at her February 1 visit.  Congestion was improved.  Shortness of breath with cough only, unknown if true asthma history, had been prescribed inhalers previously but unsure if she was wheezing.  Allergist - Dr. Ernst Bowler, Xolair during allergy season. Overall clear on her exam in February 1.  Possible reactive airway component versus postnasal drip/upper airway irritation.  Started on prednisone 40 mg daily x3 days and chest x-ray was obtained.  She had just finished azithromycin day prior.  Also discussed option of Pepcid or other H2 blocker for possible silent reflux. Did not take H2.   Chest x-ray February 1 with minimal pleural effusion bilaterally.  Lungs were clear without infiltrate.  Results were discussed on February 2 and at that time she was feeling better with no fever and cough was improving. Prednisone seemed to help the most with various treatments.   Possible parapneumonic effusion discussed and expected that to improve with her recent antibiotics.  Plan for recheck x-ray at 4 days.  She was improving and had not had repeat chest x-ray.  Now with worsening cough over  the past 4-5 days. Posttussive emesis has also returned.  No fever or dyspnea other than during coughing fits. Heartburn with episode of vomiting, sore throat 5 days ago - cough started later.  Chest x-ray was obtained this morning, with resolution of previous visualized pleural effusions and no cardiopulmonary abnormality. Covid test negative today. Still using atrovent, flonase daily.  Cough triggered by talking, or with swallowing dry food.  No chest pain.  No wheeze. Dry/nonproductive cough.  Daughters and husband have had some cough/congestion - passing around the past month. Dtr with illness last week.  Tx:  Albuterol few times past few days during coughing spell - possible slight improvement. 2-3x/day.  Hycodan cough syrup helps at night, then cough starts at 3am.  Tessalon QHS.   History Patient Active Problem List   Diagnosis Date Noted   Splenic artery aneurysm ([place]) 03/04/2021   Allergic contact dermatitis 08/09/2020   Rash 08/09/2020   Abnormal uterine bleeding 08/24/2019   Steatosis of liver 01/13/2019   Renal cyst, left 01/13/2019   Overweight (BMI 25.0-29.9) 01/13/2019   OSA (obstructive sleep apnea) 09/02/2017   Ascending aorta dilatation (HCC)    SOB (shortness of breath) 02/18/2017   Excessive daytime sleepiness 02/18/2017   Vitamin D deficiency 09/17/2016   Physical exam 09/13/2014   Palpitations 09/13/2014   Decreased hearing of both ears 09/13/2014   Glucose intolerance (impaired glucose tolerance) 02/22/2013   Depression 02/22/2013   Hypothyroid 02/22/2013   Past Medical History:  Diagnosis Date   Allergy  Ascending aorta dilatation (HCC)    35mm on MRI 02/2021   Depression    post-partum   Dilated aortic root (HCC)    58mm by chest MRI 01/2020   Dyspnea    Dysrhythmia    occational   History of gestational diabetes    Hyperlipidemia    Hypertension    Hypothyroidism    Inflammatory polyps of colon ([place])    Liver disease    OSA (obstructive  sleep apnea) 09/02/2017   Mild OSA with an AHI of 6.8/h.  On CPAP   Palpitations    PACs noted on event monitor   Pre-diabetes    updated epic hx 08-23-2019 per pt she is pre-diabetic   Splenic artery aneurysm ([place])    18mm on MRI 02/2021   Past Surgical History:  Procedure Laterality Date   broken leg  1982   Left Femur, traction and pin in leg   COLONOSCOPY  2018   ROBOTIC ASSISTED LAPAROSCOPIC HYSTERECTOMY AND SALPINGECTOMY Bilateral 08/24/2019   Procedure: XI ROBOTIC ASSISTED LAPAROSCOPIC HYSTERECTOMY AND SALPINGECTOMY;  Surgeon: Delsa Bern, MD;  Location: [facility];  Service: Gynecology;  Laterality: Bilateral;   WISDOM TOOTH EXTRACTION  1998   Allergies  Allergen Reactions   Penicillins Rash   Prior to Admission medications   Medication Sig Start Date End Date Taking? Authorizing Provider  Ascorbic Acid (VITAMIN C) 1000 MG tablet Take 1,000 mg by mouth daily.   Yes [provider]  benzonatate (TESSALON) 100 MG capsule Take 1 capsule (100 mg total) by mouth 3 (three) times daily as needed for cough. 06/09/21  Yes Wendie Agreste, MD  buPROPion (WELLBUTRIN XL) 300 MG 24 hr tablet TAKE 1 TABLET BY MOUTH EVERY DAY 06/04/21  Yes [name], MD  cholecalciferol (VITAMIN D3) 25 MCG (1000 UNIT) tablet Take 1,000 Units by mouth daily.   Yes [provider]  citalopram (CELEXA) 20 MG tablet Take 1 tablet (20 mg total) by mouth daily. 03/25/21  Yes [name], MD  EPINEPHrine 0.3 mg/0.3 mL IJ SOAJ injection INJECT 0.3 MG INTO THE MUSCLE ONCE FOR 1 DOSE. 08/20/20  Yes [provider]  fenofibrate 160 MG tablet Take 1 tablet (160 mg total) by mouth daily. Please make overdue appt with Dr. Radford Pax before anymore refills. Thank you 2nd attempt 05/29/21  Yes Turner, Eber Hong, MD  fexofenadine (ALLEGRA) 180 MG tablet Take 180 mg by mouth daily.   Yes [provider]  HYDROcodone bit-homatropine (HYCODAN) 5-1.5 MG/5ML syrup 45m by  mouth a bedtime as needed for cough. 06/16/21  Yes Wendie Agreste, MD  levocetirizine (XYZAL) 5 MG tablet Take a tablet 1-2 times daily during the spring/summer to keep the hives at [place] in combination with your Allegra daily. 11/05/20  Yes Valentina Shaggy, MD  Multiple Vitamin (MULTIVITAMIN) tablet Take 1 tablet by mouth daily.   Yes [provider]  Omega-3 Fatty Acids (FISH OIL) 1000 MG CAPS Take 1,000 mg by mouth daily.    Yes [provider]  predniSONE (DELTASONE) 20 MG tablet Take 2 tablets (40 mg total) by mouth daily with breakfast. 06/18/21  Yes Wendie Agreste, MD  rosuvastatin (CRESTOR) 40 MG tablet TAKE 1 TABLET BY MOUTH DAILY. PLEASE MAKE OVERDUE APPT WITH DR. Radford Pax BEFORE ANYMORE REFILLS. THANK YOU 3RD AND FINAL ATTEMPT 03/17/21  Yes Turner, Traci R, MD  ipratropium (ATROVENT) 0.06 % nasal spray Place 1-2 sprays into both nostrils 4 (four) times daily. As  needed for nasal congestion Patient not taking: Reported on 07/04/2021 06/09/21   Wendie Agreste, MD  triamcinolone ointment (KENALOG) 0.1 % Apply 1 application topically 2 (two) times daily. Patient not taking: Reported on 06/09/2021 06/14/20   Valentina Shaggy, MD   Social History   Socioeconomic History   Marital status: Married    Spouse name: Not on file   Number of children: Not on file   Years of education: Not on file   Highest education level: Not on file  Occupational History   Not on file  Tobacco Use   Smoking status: Never   Smokeless tobacco: Never  Vaping Use   Vaping Use: Never used  Substance and Sexual Activity   Alcohol use: Yes    Comment: rare   Drug use: No   Sexual activity: Yes  Other Topics Concern   Not on file  Social History Narrative   Not on file   Social Determinants of Health   Financial Resource Strain: Not on file  Food Insecurity: Not on file  Transportation Needs: Not on file  Physical Activity: Not on file  Stress: Not on file  Social  Connections: Not on file  Intimate Partner Violence: Not on file    Review of Systems   Objective:   Vitals:   07/04/21 1207  BP: 118/76  Pulse: 78  Resp: 16  Temp: 97.8 F (36.6 C)  TempSrc: Temporal  SpO2: 97%  Weight: 148 lb 3.2 oz (67.2 kg)  Height: 5\' 2"  (1.575 m)     Physical Exam Vitals reviewed.  Constitutional:      Appearance: Normal appearance. She is well-developed.  HENT:     Head: Normocephalic and atraumatic.  Eyes:     Conjunctiva/sclera: Conjunctivae normal.     Pupils: Pupils are equal, round, and reactive to light.  Neck:     Vascular: No carotid bruit.  Cardiovascular:     Rate and Rhythm: Normal rate and regular rhythm.     Heart sounds: Normal heart sounds.  Pulmonary:     Effort: Pulmonary effort is normal. No respiratory distress.     Breath sounds: Normal breath sounds. No stridor. No wheezing, rhonchi or rales.     Comments: Clear exam. No cough during hx/exam.  Abdominal:     Palpations: Abdomen is soft. There is no pulsatile mass.     Tenderness: There is no abdominal tenderness.  Musculoskeletal:     Right lower leg: No edema.     Left lower leg: No edema.  Skin:    General: Skin is warm and dry.  Neurological:     Mental Status: She is alert and oriented to person, place, and time.  Psychiatric:        Mood and Affect: Mood normal.        Behavior: Behavior normal.     34 minutes spent during visit, including chart review, imaging review, review of previous medications and response, response, counseling and assimilation of information, exam, discussion of plan, and chart completion.    Assessment & Plan:  SIREEN HALK is a 51 y.o. female . Cough, unspecified type - Plan: Budesonide (PULMICORT FLEXHALER) 90 MCG/ACT inhaler  Post-tussive emesis  Pleural effusion Recurrent cough.  Possible superimposed viral infections but also likely pneumonia versus pertussis based on her previous visits, some improvement with  azithromycin but ultimately most improvement with prednisone.  Prior chest x-ray with possible parapneumonic effusion, minimal pleural effusion which is now resolved on  today's x-ray.  Recurrence of cough this week, possible secondary viral syndrome versus reactive airway/cough variant asthma.  No current heartburn but did have some preceding heartburn prior to coughing fits and posttussive emesis.  No nausea/vomiting other than with coughing fits.  -With prior benefit with prednisone we will try Pulmicort inhaler for possible asthma/reactive airway.  Continue albuterol as needed.  Update on symptoms next few days to decide on repeat prednisone or pulmonary/asthma follow-up.  -Okay to try Zantac/Pepcid for now.  Continue other meds.  Has Tessalon, Hycodan if needed.  RTC/ER precautions  Meds ordered this encounter  Medications   Budesonide (PULMICORT FLEXHALER) 90 MCG/ACT inhaler    Sig: Inhale 2 puffs into the lungs 2 (two) times daily.    Dispense:  1 each    Refill:  2   Patient Instructions  X-ray was reassuring this morning.  Still may be a component of reactive airway or asthma.  Since prednisone helped we can try steroid inhaler 2 puffs twice per day. Can also try zantac or pepcid for possible heartburn cause. Give me an update on symptoms in next few days.  Return to the clinic or go to the nearest emergency room if any of your symptoms worsen or new symptoms occur.     Signed,   Merri Ray, MD [place] 07/04/21 12:45 PM

## 2021-07-04 NOTE — Telephone Encounter (Signed)
Pt is scheduled °

## 2021-07-10 NOTE — Telephone Encounter (Signed)
Yes.  Please have her seen if she is feeling worse.  I am happy to see her and if no openings can work in with one of my colleagues if needed.  Should be seen in the next 48 hours.

## 2021-07-10 NOTE — Telephone Encounter (Signed)
Pt notes sxs have worsened do you think new visit is most appropriate?

## 2021-07-11 ENCOUNTER — Ambulatory Visit (INDEPENDENT_AMBULATORY_CARE_PROVIDER_SITE_OTHER): Payer: 59 | Admitting: Family Medicine

## 2021-07-11 VITALS — BP 130/100 | HR 88 | Temp 97.6°F | Resp 20

## 2021-07-11 DIAGNOSIS — R052 Subacute cough: Secondary | ICD-10-CM

## 2021-07-11 DIAGNOSIS — R1110 Vomiting, unspecified: Secondary | ICD-10-CM

## 2021-07-11 DIAGNOSIS — F410 Panic disorder [episodic paroxysmal anxiety] without agoraphobia: Secondary | ICD-10-CM

## 2021-07-11 MED ORDER — ALBUTEROL SULFATE HFA 108 (90 BASE) MCG/ACT IN AERS: 2.0000 | INHALATION_SPRAY | Freq: Four times a day (QID) | RESPIRATORY_TRACT | 3 refills | Status: DC | PRN

## 2021-07-11 MED ORDER — METHYLPREDNISOLONE ACETATE 80 MG/ML IJ SUSP
80.0000 mg | Freq: Once | INTRAMUSCULAR | Status: AC
  Administered 2021-07-11: 80 mg via INTRAMUSCULAR

## 2021-07-11 MED ORDER — PREDNISONE 10 MG PO TABS: ORAL_TABLET | ORAL | 0 refills | Status: DC

## 2021-07-11 NOTE — Patient Instructions (Signed)
We'll call you with your Pulmonary appt START the Prednisone taper tomorrow morning- 3 pills at the same time w/ food DRINK lots of water USE the Albuterol as needed for cough or shortness of breath STOP the Flovent inhaler as this seems to be making things worse Call with any questions or concerns Hang in there!!!

## 2021-07-11 NOTE — Progress Notes (Signed)
   Subjective:    Patient ID: Ann Bass, female    DOB: 1970/12/30, 51 y.o.   MRN: [id number]  HPI Cough- pt arrived in office tearful and hyperventilating.  Having a full fledged panic attack b/c she felt that she was unable to breathe after a coughing fit.  Most recent visit was on 2/17 by Dr Carlota Raspberry but has been seen multiple times for this. She was tx'd w/ Azithro for possible pertussis.  Completed Prednisone x3 days.  Initially had pleural effusion on 2/1 but this had resolved by 2/17 image.  Continues to have SOB w/ cough.  Pt feels inhalers may be worsening situation but did have some relief w/ oral prednisone.    Has an allergist- Dr Ernst Bowler   Review of Systems For ROS see HPI   This visit occurred during the SARS-CoV-2 public health emergency.  Safety protocols were in place, including screening questions prior to the visit, additional usage of staff PPE, and extensive cleaning of exam room while observing appropriate contact time as indicated for disinfecting solutions.      Objective:   Physical Exam Vitals reviewed.  Constitutional:      General: She is in acute distress (teaful and hyperventilating).  HENT:     Head: Normocephalic and atraumatic.     Mouth/Throat:     Mouth: Mucous membranes are moist.     Pharynx: Oropharynx is clear.  Eyes:     Extraocular Movements: Extraocular movements intact.     Conjunctiva/sclera: Conjunctivae normal.     Pupils: Pupils are equal, round, and reactive to light.  Cardiovascular:     Rate and Rhythm: Normal rate and regular rhythm.     Pulses: Normal pulses.     Heart sounds: Normal heart sounds.  Pulmonary:     Breath sounds: No wheezing, rhonchi or rales.     Comments: Hyperventilating but otherwise CTAB Musculoskeletal:     Cervical back: Neck supple.  Lymphadenopathy:     Cervical: No cervical adenopathy.  Skin:    General: Skin is warm and dry.  Neurological:     General: No focal deficit present.     Mental  Status: She is alert and oriented to person, place, and time.  Psychiatric:     Comments: Anxious, tearful           Assessment & Plan:  Anxiety attack- new.  Pt became very overwhelmed during a coughing fit as she felt she was not able to breathe.  Upon lying down in the exam room and doing some deep breathing exercises she was able to calm down.  Reassured her that she was- in fact- breathing and her O2 level was good.  She was able to get control and was much more calm by end of visit  Subacute cough- onoing issue for pt.  At this time, will refer to Pulmonary.  Since she feels Flovent is worsening her sxs, will stop.  Continue Albuterol.  Steroid injection given in office today to jump start anti-inflammatory response and she is to start Prednisone taper tomorrow.  Pt expressed understanding and is in agreement w/ plan.

## 2021-07-14 ENCOUNTER — Ambulatory Visit (INDEPENDENT_AMBULATORY_CARE_PROVIDER_SITE_OTHER): Payer: 59 | Admitting: Nurse Practitioner

## 2021-07-14 ENCOUNTER — Encounter: Payer: Self-pay | Admitting: Nurse Practitioner

## 2021-07-14 ENCOUNTER — Other Ambulatory Visit: Payer: Self-pay

## 2021-07-14 VITALS — BP 138/92 | HR 74 | Temp 98.5°F | Ht 62.0 in | Wt 146.8 lb

## 2021-07-14 DIAGNOSIS — R052 Subacute cough: Secondary | ICD-10-CM

## 2021-07-14 DIAGNOSIS — J310 Chronic rhinitis: Secondary | ICD-10-CM

## 2021-07-14 DIAGNOSIS — R058 Other specified cough: Secondary | ICD-10-CM | POA: Diagnosis not present

## 2021-07-14 LAB — NITRIC OXIDE: FeNO level (ppb): 8

## 2021-07-14 MED ORDER — HYDROCODONE BIT-HOMATROP MBR 5-1.5 MG/5ML PO SOLN: 5.0000 mL | Freq: Every evening | ORAL | 0 refills | Status: DC | PRN

## 2021-07-14 MED ORDER — PANTOPRAZOLE SODIUM 40 MG PO TBEC: 40.0000 mg | DELAYED_RELEASE_TABLET | Freq: Every day | ORAL | 2 refills | Status: DC

## 2021-07-14 MED ORDER — BENZONATATE 100 MG PO CAPS: 100.0000 mg | ORAL_CAPSULE | Freq: Three times a day (TID) | ORAL | 0 refills | Status: DC

## 2021-07-14 NOTE — Progress Notes (Signed)
@Patient  ID: Ann Bass, female    DOB: Dec 14, 1970, 51 y.o.   MRN: [id number]  Chief Complaint  Patient presents with   Follow-up    Chronic cough since christmas     Referring provider: [name], MD  HPI: 50 year old female, never smoker followed for dyspnea upon exertion.  She is a patient of Dr. [name] and last seen in office on 05/30/2020.  Past medical history significant for ascending aorta dilatation, splenic artery aneurysm, OSA on CPAP (followed by cardiology), hypothyroidism, depression, allergic rhinitis.  TEST/EVENTS:   05/30/2020: OV with Dr. [name].  Completed PFTs which showed normal lung function and volumes.  Reported that dyspnea is always upon exertion, never with rest.  Occasionally having episodes of palpitations 1-2 times a month which is not associated with exertion.  All of her formal work-up was negative from a pulmonary standpoint -unclear etiology.  Previously had treadmill stress test in November 2020 which was normal response to exercise.  07/14/2021: Today - acute visit Patient presents today for persistent subacute cough since late December after contracting a viral illness and having some URI symptoms. She has been seen by her PCP numerous times for this and treated with an array of therapies, including multiple cough suppressants, prednisone, ICS, and albuterol. She was also treated for possible pertussis with azithromycin. She had a chest x ray performed 2/1 which showed possible minimal pleural effusions bilaterally; however, these were resolved on repeat 2/17. Today, she reports persistent dry, hacking cough. She will occasionally produce a very small amount of clear phlegm. She has some increased shortness of breath with coughing spells; otherwise, has had no changes in her breathing. She denies wheezing, orthopnea, PND, chest pain or lower extremity swelling. She has had no recent fevers, interim sick exposures, night sweats, or weight loss. She  does have a longstanding history with chronic rhinitis and seasonal allergies, which she is on allegra and flonase for. She does not use her Xyzal regularly and has not been using her Atrovent nasal sprays. She denies any reflux type symptoms. She has had a mild response to the prednisone and feels as though the last taper she had helped more than the current one she is on. She was trialed on Pulmicort flexhaler but felt like this worsened her cough. She does use albuterol with coughing spells and feels some relief in her SOB. She has been using Hycodan at night with some relief  in her cough.  FeNO 8 ppb   Allergies  Allergen Reactions   Penicillins Rash    Immunization History  Administered Date(s) Administered   Influenza, Quadrivalent, Recombinant, Inj, Pf 04/21/2018   Influenza,inj,Quad PF,6+ Mos 05/25/2014, 01/13/2019, 04/17/2020, 05/06/2021   PFIZER Comirnaty(Gray Top)Covid-19 Tri-Sucrose Vaccine 12/12/2020   PFIZER(Purple Top)SARS-COV-2 Vaccination 10/24/2019, 11/14/2019   Tdap 09/13/2014    Past Medical History:  Diagnosis Date   Allergy    Ascending aorta dilatation ([place])    48mm on MRI 02/2021   Depression    post-partum   Dilated aortic root ([facility])    43mm by chest MRI 01/2020   Dyspnea    Dysrhythmia    occational   History of gestational diabetes    Hyperlipidemia    Hypertension    Hypothyroidism    Inflammatory polyps of colon ([place])    Liver disease    OSA (obstructive sleep apnea) 09/02/2017   Mild OSA with an AHI of 6.8/h.  On CPAP   Palpitations    PACs noted on  event monitor   Pre-diabetes    updated epic hx 08-23-2019 per pt she is pre-diabetic   Splenic artery aneurysm ([place])    63mm on MRI 02/2021    Tobacco History: Social History   Tobacco Use  Smoking Status Never  Smokeless Tobacco Never   Counseling given: Not Answered   Outpatient Medications Prior to Visit  Medication Sig Dispense Refill   albuterol (VENTOLIN HFA) 108 (90 Base)  MCG/ACT inhaler Inhale 2 puffs into the lungs every 6 (six) hours as needed for wheezing or shortness of breath. 8 g 3   Ascorbic Acid (VITAMIN C) 1000 MG tablet Take 1,000 mg by mouth daily.     buPROPion (WELLBUTRIN XL) 300 MG 24 hr tablet TAKE 1 TABLET BY MOUTH EVERY DAY 90 tablet 2   cholecalciferol (VITAMIN D3) 25 MCG (1000 UNIT) tablet Take 1,000 Units by mouth daily.     citalopram (CELEXA) 20 MG tablet Take 1 tablet (20 mg total) by mouth daily. 90 tablet 1   EPINEPHrine 0.3 mg/0.3 mL IJ SOAJ injection INJECT 0.3 MG INTO THE MUSCLE ONCE FOR 1 DOSE.     fenofibrate 160 MG tablet Take 1 tablet (160 mg total) by mouth daily. Please make overdue appt with Dr. Radford Pax before anymore refills. Thank you 2nd attempt 90 tablet 0   fexofenadine (ALLEGRA) 180 MG tablet Take 180 mg by mouth daily.     fluticasone (FLONASE) 50 MCG/ACT nasal spray Place 2 sprays into both nostrils daily.     levocetirizine (XYZAL) 5 MG tablet Take a tablet 1-2 times daily during the spring/summer to keep the hives at [place] in combination with your Allegra daily. 60 tablet 3   Multiple Vitamin (MULTIVITAMIN) tablet Take 1 tablet by mouth daily.     Omega-3 Fatty Acids (FISH OIL) 1000 MG CAPS Take 1,000 mg by mouth daily.      predniSONE (DELTASONE) 10 MG tablet 3 tabs x3 days and then 2 tabs x3 days and then 1 tab x3 days.  Take w/ food. 18 tablet 0   rosuvastatin (CRESTOR) 40 MG tablet TAKE 1 TABLET BY MOUTH DAILY. PLEASE MAKE OVERDUE APPT WITH DR. Radford Pax BEFORE ANYMORE REFILLS. THANK YOU 3RD AND FINAL ATTEMPT 90 tablet 2   triamcinolone ointment (KENALOG) 0.1 % Apply 1 application topically 2 (two) times daily. 80 g 1   ipratropium (ATROVENT) 0.06 % nasal spray Place 1-2 sprays into both nostrils 4 (four) times daily. As needed for nasal congestion (Patient not taking: Reported on 07/14/2021) 15 mL 5   benzonatate (TESSALON) 100 MG capsule Take 1 capsule (100 mg total) by mouth 3 (three) times daily as needed for cough.  (Patient not taking: Reported on 07/14/2021) 30 capsule 0   Budesonide (PULMICORT FLEXHALER) 90 MCG/ACT inhaler Inhale 2 puffs into the lungs 2 (two) times daily. (Patient not taking: Reported on 07/14/2021) 1 each 2   HYDROcodone bit-homatropine (HYCODAN) 5-1.5 MG/5ML syrup 75m by mouth a bedtime as needed for cough. (Patient not taking: Reported on 07/14/2021) 120 mL 0   Facility-Administered Medications Prior to Visit  Medication Dose Route Frequency Provider Last Rate Last Admin   omalizumab Arvid Right) injection 300 mg  300 mg Subcutaneous Q28 days Valentina Shaggy, MD   300 mg at 01/21/21 1009     Review of Systems:   Constitutional: No weight loss or gain, night sweats, fevers, chills, fatigue, or lassitude. HEENT: No headaches, difficulty swallowing, tooth/dental problems, or sore throat. No sneezing, itching, ear ache. +postnasal drip,  throat tickle CV:  No chest pain, orthopnea, PND, swelling in lower extremities, anasarca, dizziness, palpitations, syncope Resp: +shortness of breath with coughing spells; paroxysmal primarily non-productive cough. No excess mucus or change in color of mucus. No hemoptysis. No wheezing.  No chest wall deformity GI:  No heartburn, indigestion, abdominal pain, nausea, vomiting, diarrhea, change in bowel habits, loss of appetite, bloody stools.  GU: No dysuria, change in color of urine, urgency or frequency.  No flank pain, no hematuria  Skin: No rash, lesions, ulcerations MSK:  No joint pain or swelling.  No decreased range of motion.  No back pain. Neuro: No dizziness or lightheadedness.  Psych: No depression or anxiety. Mood stable.     Physical Exam:  BP (!) 138/92 (BP Location: Right Arm, Cuff Size: Normal)    Pulse 74    Temp 98.5 F (36.9 C) (Oral)    Ht 5\' 2"  (1.575 m)    Wt 146 lb 12.8 oz (66.6 kg)    LMP 10/27/2017    SpO2 96%    BMI 26.85 kg/m   GEN: Pleasant, interactive, well-nourished; in no acute distress. HEENT:  Normocephalic and  atraumatic. EACs patent bilaterally. TM pearly gray with present light reflex bilaterally. PERRLA. Sclera white. Nasal turbinates pink, moist and patent bilaterally. Clear rhinorrhea present. Oropharynx erythematous and moist, without exudate or edema. No lesions, ulcerations NECK:  Supple w/ fair ROM. No JVD present. Normal carotid impulses w/o bruits. Thyroid symmetrical with no goiter or nodules palpated. No lymphadenopathy.   CV: RRR, no m/r/g, no peripheral edema. Pulses intact, +2 bilaterally. No cyanosis, pallor or clubbing. PULMONARY:  Unlabored, regular breathing. Clear bilaterally A&P w/o wheezes/rales/rhonchi. No accessory muscle use. No dullness to percussion. Dry, hacking cough GI: BS present and normoactive. Soft, non-tender to palpation. No organomegaly or masses detected. No CVA tenderness. MSK: No erythema, warmth or tenderness. Cap refil <2 sec all extrem. No deformities or joint swelling noted.  Neuro: A/Ox3. No focal deficits noted.   Skin: Warm, no lesions or rashe Psych: Normal affect and behavior. Judgement and thought content appropriate.     Lab Results:  CBC    Component Value Date/Time   WBC 6.2 02/20/2021 1054   RBC 4.96 02/20/2021 1054   HGB 14.9 02/20/2021 1054   HCT 44.0 02/20/2021 1054   PLT 325.0 02/20/2021 1054   MCV 88.8 02/20/2021 1054   MCH 29.4 08/17/2019 1148   MCHC 33.9 02/20/2021 1054   RDW 13.4 02/20/2021 1054   LYMPHSABS 1.5 07/27/2019 0943   MONOABS 0.3 07/27/2019 0943   EOSABS 0.3 07/27/2019 0943   BASOSABS 0.0 07/27/2019 0943    BMET    Component Value Date/Time   NA 137 02/20/2021 1054   NA 139 12/22/2019 0900   K 3.9 02/20/2021 1054   CL 103 02/20/2021 1054   CO2 26 02/20/2021 1054   GLUCOSE 91 02/20/2021 1054   BUN 12 02/20/2021 1054   BUN 8 12/22/2019 0900   CREATININE 0.68 02/20/2021 1054   CALCIUM 9.8 02/20/2021 1054   GFRNONAA 106 12/22/2019 0900   GFRAA 122 12/22/2019 0900    BNP No results found for:  BNP   Imaging:  DG Chest 2 View  Result Date: 07/04/2021 CLINICAL DATA:  Follow-up pleural effusion. EXAM: CHEST - 2 VIEW COMPARISON:  June 18, 2021 FINDINGS: The heart size and mediastinal contours are within normal limits. No focal airspace consolidation. No pleural effusion or pneumothorax. The visualized skeletal structures are unchanged. IMPRESSION: Resolution of the  previously visualized pleural effusions. No acute cardiopulmonary abnormality. Electronically Signed   By: Dahlia Bailiff M.D.   On: 07/04/2021 08:49   DG Chest 2 View  Result Date: 06/18/2021 CLINICAL DATA:  Chronic cough 1.5 months EXAM: CHEST - 2 VIEW COMPARISON:  02/15/2017 FINDINGS: Heart size and vascularity normal. Lungs are clear without infiltrate. Possible minimal pleural effusion bilaterally. IMPRESSION: Minimal pleural effusion bilaterally.  Lungs otherwise clear. Electronically Signed   By: Franchot Gallo M.D.   On: 06/18/2021 13:27    methylPREDNISolone acetate (DEPO-MEDROL) injection 80 mg     Date Action Dose Route User   07/11/2021 1213 Given 80 mg Intramuscular (Left Deltoid) Juliann Pulse, CMA       PFT Results Latest Ref Rng & Units 05/30/2020  FVC-Pre L 3.33  FVC-Predicted Pre % 111  FVC-Post L 3.41  FVC-Predicted Post % 114  Pre FEV1/FVC % % 87  Post FEV1/FCV % % 83  FEV1-Pre L 2.89  FEV1-Predicted Pre % 118  FEV1-Post L 2.83  DLCO uncorrected ml/min/mmHg 24.22  DLCO UNC% % 112  DLCO corrected ml/min/mmHg 24.22  DLCO COR %Predicted % 112  DLVA Predicted % 116  TLC L 5.12  TLC % Predicted % 107  RV % Predicted % 105    No results found for: NITRICOXIDE      Assessment & Plan:   Upper airway cough syndrome Persistent post viral/upper airway cough. No evidence of superimposed infection on recent imaging. Former PFTs and exercise testing without evidence of asthma; however, given improvement with albuterol, did check FeNO today which was normal. Suspect primarily related to upper  airway irritation from chronic rhinitis. Will optimize postnasal drainage control and cough suppression to reduce further irritation/inflammation. Initiated GERD control therapy with PPI daily. Previously started on prednisone taper - instructed to complete as directed. Advised to not use Pulmicort flexhaler as DPI is going to exacerbate cough - pt stated it did not help her cough or breathing anyway. If cough persists despite these interventions, may consider further imaging. PDMP reviewed for one time refill of Hycodan  Patient Instructions  -Continue Albuterol inhaler 2 puffs every 6 hours as needed for shortness of breath or wheezing. Notify if symptoms persist despite rescue inhaler/neb use. -Continue Allegra 1 tab daily -Xyzal to 5 mg daily. Can take 1-2 times per day during spring/summer as prescribed by allergist  -Continue Flonase nasal spray 2 sprays each nostril daily -Continue prednisone taper as previously prescribed  -Restart Atrovent nasal spray 2 sprays each nostril Twice daily. Can use up to 4 times a day for postnasal drainage/nasal congestion -Continue Hycodan cough syrup 5 mL at bedtime as needed for cough. May cause drowsiness. -Delsym 2 tsp Twice daily for cough -Tessalon Perles Three times a day for cough -Protonix 40 mg daily  -Saline nasal irrigation 1-2 times per day   Referral to ENT  Follow up in two weeks with Dr. [name]. If symptoms do not improve or worsen, please contact office for sooner follow up or seek emergency care.    Chronic rhinitis Persistent rhinitis despite multiple therapies. Followed by asthma/allergy for allergies/uticaria. Advised to restart Xyzal daily and Atrovent. Referral placed to ENT for further evaluation/management.   I spent 32 min of dedicated to the care of this patient on the date of this encounter to include pre-visit review of records, face-to-face time with the patient discussing conditions above, post visit ordering of testing,  clinical documentation with the electronic health record, making appropriate referrals as documented,  and communicating necessary findings to members of the patients care team.  Clayton Bibles, NP 07/14/2021  Pt aware and understands NP's role.

## 2021-07-14 NOTE — Assessment & Plan Note (Addendum)
Persistent post viral/upper airway cough. No evidence of superimposed infection on recent imaging. Former PFTs and exercise testing without evidence of asthma; however, given improvement with albuterol, did check FeNO today which was normal. Suspect primarily related to upper airway irritation from chronic rhinitis. Will optimize postnasal drainage control and cough suppression to reduce further irritation/inflammation. Initiated GERD control therapy with PPI daily. Previously started on prednisone taper - instructed to complete as directed. Advised to not use Pulmicort flexhaler as DPI is going to exacerbate cough - pt stated it did not help her cough or breathing anyway. If cough persists despite these interventions, may consider further imaging. PDMP reviewed for one time refill of Hycodan  Patient Instructions  -Continue Albuterol inhaler 2 puffs every 6 hours as needed for shortness of breath or wheezing. Notify if symptoms persist despite rescue inhaler/neb use. -Continue Allegra 1 tab daily -Xyzal to 5 mg daily. Can take 1-2 times per day during spring/summer as prescribed by allergist  -Continue Flonase nasal spray 2 sprays each nostril daily -Continue prednisone taper as previously prescribed  -Restart Atrovent nasal spray 2 sprays each nostril Twice daily. Can use up to 4 times a day for postnasal drainage/nasal congestion -Continue Hycodan cough syrup 5 mL at bedtime as needed for cough. May cause drowsiness. -Delsym 2 tsp Twice daily for cough -Tessalon Perles Three times a day for cough -Protonix 40 mg daily  -Saline nasal irrigation 1-2 times per day   Referral to ENT  Follow up in two weeks with Dr. [name]. If symptoms do not improve or worsen, please contact office for sooner follow up or seek emergency care.

## 2021-07-14 NOTE — Assessment & Plan Note (Signed)
Persistent rhinitis despite multiple therapies. Followed by asthma/allergy for allergies/uticaria. Advised to restart Xyzal daily and Atrovent. Referral placed to ENT for further evaluation/management.

## 2021-07-14 NOTE — Patient Instructions (Addendum)
-  Continue Albuterol inhaler 2 puffs every 6 hours as needed for shortness of breath or wheezing. Notify if symptoms persist despite rescue inhaler/neb use. -Continue Allegra 1 tab daily -Xyzal to 5 mg daily. Can take 1-2 times per day during spring/summer as prescribed by allergist  -Continue Flonase nasal spray 2 sprays each nostril daily -Continue prednisone taper as previously prescribed  -Restart Atrovent nasal spray 2 sprays each nostril Twice daily. Can use up to 4 times a day for postnasal drainage/nasal congestion -Continue Hycodan cough syrup 5 mL at bedtime as needed for cough. May cause drowsiness. -Delsym 2 tsp Twice daily for cough -Tessalon Perles Three times a day for cough -Protonix 40 mg daily  -Saline nasal irrigation 1-2 times per day   Referral to ENT  Follow up in two weeks with Dr. [name]. If symptoms do not improve or worsen, please contact office for sooner follow up or seek emergency care.

## 2021-07-22 ENCOUNTER — Encounter: Payer: Self-pay | Admitting: Allergy & Immunology

## 2021-07-22 ENCOUNTER — Telehealth: Payer: Self-pay | Admitting: *Deleted

## 2021-07-22 NOTE — Telephone Encounter (Signed)
Patient called wanting to be scheduled to come in and get her Xolair, but she has not received an injection since September. I advised that she may need an office visit first, she states that normally she schedules her appointment and then orders her medicine. She would be considered a restart. I told her I wanted to reach out and speak with you first.  ?

## 2021-07-23 ENCOUNTER — Ambulatory Visit (INDEPENDENT_AMBULATORY_CARE_PROVIDER_SITE_OTHER): Payer: 59 | Admitting: Cardiology

## 2021-07-23 ENCOUNTER — Encounter: Payer: Self-pay | Admitting: Cardiology

## 2021-07-23 ENCOUNTER — Other Ambulatory Visit: Payer: Self-pay

## 2021-07-23 VITALS — BP 124/82 | HR 76 | Ht 62.0 in | Wt 148.4 lb

## 2021-07-23 DIAGNOSIS — I77810 Thoracic aortic ectasia: Secondary | ICD-10-CM | POA: Diagnosis not present

## 2021-07-23 DIAGNOSIS — E7800 Pure hypercholesterolemia, unspecified: Secondary | ICD-10-CM

## 2021-07-23 DIAGNOSIS — I491 Atrial premature depolarization: Secondary | ICD-10-CM

## 2021-07-23 DIAGNOSIS — G4733 Obstructive sleep apnea (adult) (pediatric): Secondary | ICD-10-CM | POA: Diagnosis not present

## 2021-07-23 DIAGNOSIS — R0609 Other forms of dyspnea: Secondary | ICD-10-CM

## 2021-07-23 DIAGNOSIS — I728 Aneurysm of other specified arteries: Secondary | ICD-10-CM

## 2021-07-23 NOTE — Patient Instructions (Signed)
Medication Instructions:  ?Your physician recommends that you continue on your current medications as directed. Please refer to the Current Medication list given to you today. ? ?*If you need a refill on your cardiac medications before your next appointment, please call your pharmacy* ? ?Testing/Procedures: ?Your physician has requested that you have an echocardiogram. Echocardiography is a painless test that uses sound waves to create images of your heart. It provides your doctor with information about the size and shape of your heart and how well your heart?s chambers and valves are working. This procedure takes approximately one hour. There are no restrictions for this procedure. ? ?Follow-Up: ?At [facility], you and your health needs are our priority.  As part of our continuing mission to provide you with exceptional heart care, we have created designated Provider Care Teams.  These Care Teams include your primary Cardiologist (physician) and Advanced Practice Providers (APPs -  Physician Assistants and Nurse Practitioners) who all work together to provide you with the care you need, when you need it. ? ?Your next appointment:   ?1 year(s) ? ?The format for your next appointment:   ?In Person ? ?Provider:   ?Fransico Him, MD{ ? ?

## 2021-07-23 NOTE — Addendum Note (Signed)
Addended by: Antonieta Iba on: 07/23/2021 10:51 AM ? ? Modules accepted: Orders ? ?

## 2021-07-23 NOTE — Telephone Encounter (Signed)
It looks like she is having some problems with worsening of her allergies as well upon reviewing her chart. I still suspect her cough is related to upper airway irritation from postnasal drainage. Ensure she is using her nasal sprays and allergy medications. Continue mucinex DM and chlortab at night. Can send more tessalon perles Three times a day as needed for cough if needed. I sent a referral to ENT at her previous visit. Has she heard from them yet? Thanks.

## 2021-07-23 NOTE — Progress Notes (Signed)
Date:  02/07/2020   ID:  Ann Bass, DOB June 12, 1970, MRN [id number]  Date:  07/23/2021   ID:  Ann Bass, DOB April 04, 1971, MRN [id number]  PCP:  [name], MD  Cardiologist:  Fransico Him, MD    Referring MD: [name], MD   Chief Complaint  Patient presents with   Chest Pain   Shortness of Breath   Sleep Apnea    History of Present Illness:    Ann Bass is a 51 y.o. female with a hx of palpitations and shortness of breath.  2D echocardiogram showed normal LV function with mildly dilated a sending aorta at 39 mmHg otherwise normal .  Exercise stress test noted showed no inducible ischemia.  Event monitor was normal except for a rare PAC.  She also has mild obstructive sleep apnea with an AHI of 6.8/hr.  Lowest oxygen saturation drop was 82%.  She is on CPAP.   She is here today for followup and is doing well.  She had a URI back in December and has been left with a cough and SOB.  The SOB comes on after she gets a coughing fit.  She is being worked up by Pulmonary.  She denies any chest pain or pressure, PND, orthopnea, LE edema, dizziness or syncope. Occasionally she will have a fast heart beat for a few seconds and then it resolves. She is compliant with her meds and is tolerating meds with no SE.     She had been doing well with her CPAP device but since Christmas she has had a chronic cough which has prohibited her from using her CPAP recently.  She is getting a workup now for it with Pulmonary.  She as tolerated the mask and feels the pressure is adequate.  Since going on CPAP she feels rested in the am and has no significant daytime sleepiness.  She denies any significant mouth or nasal dryness or nasal congestion.  She does not think that she snores.      Past Medical History:  Diagnosis Date   Allergy    Ascending aorta dilatation ([place])    49m on MRI 02/2021   Depression    post-partum   Dilated aortic root ([facility])    410mby chest MRI  01/2020   Dyspnea    Dysrhythmia    occational   History of gestational diabetes    Hyperlipidemia    Hypertension    Hypothyroidism    [place]   Liver disease    OSA (obstructive sleep apnea) 09/02/2017   Mild OSA with an AHI of 6.8/h.  On CPAP   Palpitations    PACs noted on event monitor   Pre-diabetes    updated epic hx 08-23-2019 per pt she is pre-diabetic   [place]   1264mn MRI 02/2021    Past Surgical History:  Procedure Laterality Date   broken leg  1982   Left Femur, traction and pin in leg   COLONOSCOPY  2018   ROBOTIC ASSISTED LAPAROSCOPIC HYSTERECTOMY AND SALPINGECTOMY Bilateral 08/24/2019   Procedure: XI ROBOTIC ASSISTED LAPAROSCOPIC HYSTERECTOMY AND SALPINGECTOMY;  Surgeon: RivDelsa BernD;  [facility];  Laterality: Bilateral;   WISDOM TOOTH EXTRACTION  1998    Current Medications: Current Meds  Medication Sig   albuterol (VENTOLIN HFA) 108 (90 Base) MCG/ACT inhaler Inhale 2 puffs into the lungs every 6 (  six) hours as needed for wheezing or shortness of breath.   Ascorbic Acid (VITAMIN C) 1000 MG tablet Take 1,000 mg by mouth daily.   benzonatate (TESSALON) 100 MG capsule Take 1 capsule (100 mg total) by mouth 3 (three) times daily.   buPROPion (WELLBUTRIN XL) 300 MG 24 hr tablet TAKE 1 TABLET BY MOUTH EVERY DAY   cholecalciferol (VITAMIN D3) 25 MCG (1000 UNIT) tablet Take 1,000 Units by mouth daily.   citalopram (CELEXA) 20 MG tablet Take 1 tablet (20 mg total) by mouth daily.   EPINEPHrine 0.3 mg/0.3 mL IJ SOAJ injection INJECT 0.3 MG INTO THE MUSCLE ONCE FOR 1 DOSE.   fenofibrate 160 MG tablet Take 1 tablet (160 mg total) by mouth daily. Please make overdue appt with Dr. Radford Pax before anymore refills. Thank you 2nd attempt   fexofenadine (ALLEGRA) 180 MG tablet Take 180 mg by mouth daily.   fluticasone (FLONASE) 50 MCG/ACT nasal spray Place 2 sprays into both  nostrils daily.   HYDROcodone bit-homatropine (HYCODAN) 5-1.5 MG/5ML syrup Take 5 mLs by mouth at bedtime as needed for cough.   ipratropium (ATROVENT) 0.06 % nasal spray Place 1-2 sprays into both nostrils 4 (four) times daily. As needed for nasal congestion   levocetirizine (XYZAL) 5 MG tablet Take a tablet 1-2 times daily during the spring/summer to keep the hives at [place] in combination with your Allegra daily.   Multiple Vitamin (MULTIVITAMIN) tablet Take 1 tablet by mouth daily.   Omega-3 Fatty Acids (FISH OIL) 1000 MG CAPS Take 1,000 mg by mouth daily.    pantoprazole (PROTONIX) 40 MG tablet Take 1 tablet (40 mg total) by mouth daily.   predniSONE (DELTASONE) 10 MG tablet 3 tabs x3 days and then 2 tabs x3 days and then 1 tab x3 days.  Take w/ food.   rosuvastatin (CRESTOR) 40 MG tablet TAKE 1 TABLET BY MOUTH DAILY. PLEASE MAKE OVERDUE APPT WITH DR. Radford Pax BEFORE ANYMORE REFILLS. THANK YOU 3RD AND FINAL ATTEMPT   triamcinolone ointment (KENALOG) 0.1 % Apply 1 application topically 2 (two) times daily.   Current Facility-Administered Medications for the 07/23/21 encounter (Office Visit) with Sueanne Margarita, MD  Medication   omalizumab Arvid Right) injection 300 mg     Allergies:   Penicillins   Social History   Socioeconomic History   Marital status: Married    Spouse name: Not on file   Number of children: Not on file   Years of education: Not on file   Highest education level: Not on file  Occupational History   Not on file  Tobacco Use   Smoking status: Never   Smokeless tobacco: Never  Vaping Use   Vaping Use: Never used  Substance and Sexual Activity   Alcohol use: Yes    Comment: rare   Drug use: No   Sexual activity: Yes  Other Topics Concern   Not on file  Social History Narrative   Not on file   Social Determinants of Health   Financial Resource Strain: Not on file  Food Insecurity: Not on file  Transportation Needs: Not on file  Physical Activity: Not on file   Stress: Not on file  Social Connections: Not on file     Family History: The patient's family history includes Cancer in her father and paternal grandmother; Diabetes in her mother. There is no history of Allergic rhinitis, Angioedema, Asthma, Eczema, Immunodeficiency, or Urticaria.  ROS:   Please see the history of present illness.  ROS  All other systems reviewed and negative.   EKGs/Labs/Other Studies Reviewed:    The following studies were reviewed today: PAP compliance download from [facility]   EKG:  EKG is  ordered today.  The ekg ordered today demonstrates NSR with nonspecific T wave abnormality  Recent Labs: 02/20/2021: ALT 28; BUN 12; Creatinine, Ser 0.68; Hemoglobin 14.9; Platelets 325.0; Potassium 3.9; Sodium 137; TSH 3.25   Recent Lipid Panel    Component Value Date/Time   CHOL 109 10/28/2020 1035   TRIG 90 10/28/2020 1035   HDL 38 (L) 10/28/2020 1035   CHOLHDL 2.9 10/28/2020 1035   CHOLHDL 5 07/27/2019 0943   VLDL 20.2 07/27/2019 0943   LDLCALC 53 10/28/2020 1035   LDLDIRECT 155.0 01/13/2019 1208    Physical Exam:    VS:  BP 124/82    Pulse 76    Ht '5\' 2"'$  (1.575 m)    Wt 148 lb 6.4 oz (67.3 kg)    LMP 10/27/2017    SpO2 97%    BMI 27.14 kg/m     Wt Readings from Last 3 Encounters:  07/23/21 148 lb 6.4 oz (67.3 kg)  07/14/21 146 lb 12.8 oz (66.6 kg)  07/04/21 148 lb 3.2 oz (67.2 kg)   GEN: Well nourished, well developed in no acute distress HEENT: Normal NECK: No JVD; No carotid bruits LYMPHATICS: No lymphadenopathy CARDIAC:RRR, no murmurs, rubs, gallops RESPIRATORY:  Clear to auscultation without rales, wheezing or rhonchi  ABDOMEN: Soft, non-tender, non-distended MUSCULOSKELETAL:  No edema; No deformity  SKIN: Warm and dry NEUROLOGIC:  Alert and oriented x 3 PSYCHIATRIC:  Normal affect    ASSESSMENT:    1. OSA (obstructive sleep apnea)   2. Ascending aortic aneurysm ([place])   3. PAC (premature atrial contraction)    PLAN:    In order of  problems listed above:  1.  OSA - The patient is tolerating PAP therapy well without any problems. The PAP download performed by his DME was personally reviewed and interpreted by me today and showed an AHI of 0.1/hr on auto PAP with 27% compliance in using more than 4 hours nightly.  The patient has been using and benefiting from PAP use and will continue to benefit from therapy.    2.  Dilated ascending aorta -echo and Chest CTA 12/2018 showed ascending aorta dimension around 71m. -cardiac MRI 01/2021 showed stable ascending aortic aneurysm at 4.1cm -repeat cMRI in 9/24 -Blood pressure is adequately controlled on exam today  3.  PACs -She still has palpitations from time to time but fairly asymptomatic  4.  HLD -LDL goal < 70 given aortic aneurysm -Check FLP and ALT in June 2022 -Continue prescription drug management with rosuvastatin 40 mg daily with as needed refills  5.  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  DOE -2D echo has been normal in the past but now having worsening SOB after getting a URI back in Dec 2022 and is having issues with SOB and cough and is being worked up by Pulmonary -ETT was normal in 2018 and 2021 -Coronary calcium score 04/06/2020 was 0 -I will repeat 2D echo to make sure LVF has not changed since her URI in Dec    Medication Adjustments/Labs and Tests Ordered: Current medicines are reviewed at length with the patient today.  Concerns regarding medicines are outlined above.  Orders Placed This Encounter  Procedures  EKG 12-Lead   No orders of the defined types were placed in this encounter.   Signed, Fransico Him, MD  07/23/2021 10:49 AM    [place]

## 2021-07-23 NOTE — Telephone Encounter (Signed)
Mychart message sent by pt: ?Ann [facility] (supporting [place], Karie Schwalbe, NP) 2 hours ago (11:33 AM)  ? ?Hi Katherine, ?  ?I am following up with you about the chronic cough I have been dealing with. I have been following the medication plan that you gave me, and it has helped. However, I took my last dose of Prednisone and Hydrocodone on Sunday March 5. Since then, my symptoms have returned. The same thing happened the last time I was on a Prednisone pack. As soon as I finished it, my symptoms came back. Currently, I am not having violent coughing fits like I was before. However, the coughing is very frequent and keeps me up at night.  ?  ?Please let me know if there is anything else I can do ?  ?Ann Bass ?  ?PS - Do you think I need to see an ENT? ? ? ? ? ?Joellen Jersey, please advise. ?

## 2021-07-24 ENCOUNTER — Ambulatory Visit: Payer: 59 | Admitting: Allergy & Immunology

## 2021-07-24 NOTE — Telephone Encounter (Signed)
Called and spoke with the patient and I have scheduled for her to come back in next week and re-start her Xolair.  ?

## 2021-07-28 ENCOUNTER — Ambulatory Visit (INDEPENDENT_AMBULATORY_CARE_PROVIDER_SITE_OTHER): Payer: 59 | Admitting: Nurse Practitioner

## 2021-07-28 ENCOUNTER — Other Ambulatory Visit: Payer: Self-pay

## 2021-07-28 ENCOUNTER — Encounter: Payer: Self-pay | Admitting: Nurse Practitioner

## 2021-07-28 DIAGNOSIS — R058 Other specified cough: Secondary | ICD-10-CM

## 2021-07-28 DIAGNOSIS — G4733 Obstructive sleep apnea (adult) (pediatric): Secondary | ICD-10-CM | POA: Diagnosis not present

## 2021-07-28 DIAGNOSIS — L239 Allergic contact dermatitis, unspecified cause: Secondary | ICD-10-CM | POA: Diagnosis not present

## 2021-07-28 DIAGNOSIS — J310 Chronic rhinitis: Secondary | ICD-10-CM | POA: Diagnosis not present

## 2021-07-28 DIAGNOSIS — T7840XA Allergy, unspecified, initial encounter: Secondary | ICD-10-CM | POA: Insufficient documentation

## 2021-07-28 NOTE — Assessment & Plan Note (Signed)
Significantly improved. Continue with postnasal drainage and cough control. Chlortab at night for cough. Decrease Atrovent nasal spray to 1 spray Twice daily to help with dryness. Advised she could increase back if nasal symptoms/cough worsened.  ? ?Patient Instructions  ?-Continue Albuterol inhaler 2 puffs every 6 hours as needed for shortness of breath or wheezing. Notify if symptoms persist despite rescue inhaler/neb use. ?-Continue Allegra 1 tab daily ?-Continue Xyzal to 5 mg daily. Can take 1-2 times per day during spring/summer as prescribed by allergist  ?-Continue Flonase nasal spray 2 sprays each nostril daily ?-Continue prednisone taper as previously prescribed ?  ?-Decrease Atrovent nasal spray 1 sprays each nostril Twice daily. Can use up to 4 times a day for postnasal drainage/nasal congestion ?-Continue delsym 2 tsp Twice daily for cough ?-Chlorpheniramine 4 mg At bedtime for cough  ?-Continue protonix 40 mg daily  ?-Continue saline nasal irrigation 1-2 times per day  ? ?Frequent sips of water. Suck on sugar free hard candies throughout the day. Avoid throat clearing.  ?  ?Follow up on Friday to get Xolair shot with allergy  ? ?Follow up with ENT once appointment is scheduled  ?  ?Follow up in 3 months with Dr. [name]. If symptoms do not improve or worsen, please contact office for sooner follow up or seek emergency care. ? ? ?

## 2021-07-28 NOTE — Assessment & Plan Note (Signed)
Awaiting ENT appointment. Continue with flonase and atrovent.  ?

## 2021-07-28 NOTE — Patient Instructions (Signed)
-  Continue Albuterol inhaler 2 puffs every 6 hours as needed for shortness of breath or wheezing. Notify if symptoms persist despite rescue inhaler/neb use. ?-Continue Allegra 1 tab daily ?-Continue Xyzal to 5 mg daily. Can take 1-2 times per day during spring/summer as prescribed by allergist  ?-Continue Flonase nasal spray 2 sprays each nostril daily ?-Continue prednisone taper as previously prescribed ?  ?-Decrease Atrovent nasal spray 1 sprays each nostril Twice daily. Can use up to 4 times a day for postnasal drainage/nasal congestion ?-Continue delsym 2 tsp Twice daily for cough ?-Chlorpheniramine 4 mg At bedtime for cough  ?-Continue protonix 40 mg daily  ?-Continue saline nasal irrigation 1-2 times per day  ? ?Frequent sips of water. Suck on sugar free hard candies throughout the day. Avoid throat clearing.  ?  ?Follow up on Friday to get Xolair shot with allergy  ? ?Follow up with ENT once appointment is scheduled  ?  ?Follow up in 3 months with Ann Bass. If symptoms do not improve or worsen, please contact office for sooner follow up or seek emergency care. ?

## 2021-07-28 NOTE — Assessment & Plan Note (Signed)
Worsening allergies with warmer weather. Did advise restart of Xyzal at last visit. Plans to restart Xolair this coming Friday. She was experiencing symptoms of chronic rhinitis previously when on Xolair so will continue with ENT referral. Follow up with allergist as scheduled.  ?

## 2021-07-28 NOTE — Progress Notes (Signed)
$'@Patient'q$  ID: Ann Bass, female    DOB: 1970/12/14, 51 y.o.   MRN: [id number]  Chief Complaint  Patient presents with   Follow-up    Pt states her cough is getting better but she is still coughing and also states that the cough is keeping her up at night. Will occasionally cough up some phlegm.    Referring provider: [name], MD  HPI: 51 year old female, never smoker followed for DOE and upper airway cough syndrome. She is a patient of Dr. [name] and last seen in office on 07/14/2021. Past medical history significant for ascending aortic dilatation, splenic artery aneurysm, OSA on CPAP (followed by Dr Radford Pax), hypothyroidism, depression, allergic rhinitis.   TEST/EVENTS:  11/21/2018 echocardiogram: EF 60 to 65%.  Normal diastolic parameters.  RV size and function was normal.  Trivial AR.  Mildly dilated aortic root and ascending aorta 12/22/2018 CTA cardiac: Ascending thoracic aorta measuring 4.2 x 4.1 cm in transverse diameter.  Ascending thoracic aorta measuring 2.3 x 2.3 cm.  No evidence of dissection.  Lungs were relatively unremarkable with mild bibasilar atelectasis.  Advised annual follow-up 05/30/2020 PFTs: FVC 3.41 (114), FEV1 2.89 (118), ratio 83, TLC 107%, DLCOunc 112%.  No BD 06/18/2021 CXR 2 view: Lungs are clear.  There were possible minimal pleural effusions bilaterally, suspected to be related to recent illness. 07/04/2021 CXR 2 view: Lungs clear without focal Consolidation.  Resolution of previously visualized pleural effusions.  05/30/2020: OV with Dr. [name].  Completed PFTs which showed normal lung functions and volume.  Reported that dyspnea is always upon exertion, never at rest.  Occasionally having episodes of palpitations 1-2 times a month which is not associated with exertion.  All formal cardiac and pulmonary work-ups were relatively negative-unclear etiology for DOE.  07/14/2021: OV with Angelin Cutrone NP for subacute cough since December after viral illness and some  URI symptoms.  Has had recurrent sinusitis and treated by PCP numerous times for the symptoms.  She was also treated for possible pertussis with azithromycin.  At [place], she reported that she previously responded better to prednisone.  The current taper that she was on prescribed by her PCP did not seem to be helping much.  Felt as though Pulmicort Flexhaler worsened cough-advised to stop as she did not notice any improvement in her symptoms with it and DPI can exacerbate cough.  Does use albuterol occasionally with some relief.  Was using Hycodan at night for cough.  Treated for upper airway cough/postviral cough with postnasal drainage control and trigger prevention.  Restarted Xyzal and Atrovent.  Cough control with Delsym, Tessalon Perles and Hycodan.  Started on PPI for GERD control.  Referred to ENT for further evaluation of chronic sinusitis.  No evidence of superimposed infection on recent imaging.  FeNO was noted to be normal at visit.  Had previously stopped Xolair injections-advised to reach out to allergy to consider restarting.  07/28/2021: Today-follow-up Patient presents today for follow-up.  She reports that her cough has significantly improved and overall she feels better.  She does still occasionally have a cough at night and some chronic nasal drainage.  Cough is nonproductive.  She does feel like the Atrovent that we added last time is working well but can dry her out at times.  She is still awaiting follow-up with ENT.  She did follow-up with her allergist and is anticipated to restart Xolair injections this Friday.  She continues to use Delsym twice a day, Allegra, Xyzal, Flonase, Protonix  daily.  Allergies  Allergen Reactions   Penicillins Rash    Immunization History  Administered Date(s) Administered   Influenza, Quadrivalent, Recombinant, Inj, Pf 04/21/2018   Influenza,inj,Quad PF,6+ Mos 05/25/2014, 01/13/2019, 04/17/2020, 05/06/2021   PFIZER Comirnaty(Gray Top)Covid-19  Tri-Sucrose Vaccine 12/12/2020   PFIZER(Purple Top)SARS-COV-2 Vaccination 10/24/2019, 11/14/2019   Tdap 09/13/2014    Past Medical History:  Diagnosis Date   Allergy    Ascending aorta dilatation ([place])    61m on MRI 02/2021   Depression    post-partum   Dilated aortic root ([facility])    485mby chest MRI 01/2020   Dyspnea    Dysrhythmia    occational   History of gestational diabetes    Hyperlipidemia    Hypertension    Hypothyroidism    Inflammatory polyps of colon ([facility]   Liver disease    OSA (obstructive sleep apnea) 09/02/2017   Mild OSA with an AHI of 6.8/h.  On CPAP   Palpitations    PACs noted on event monitor   Pre-diabetes    updated epic hx 08-23-2019 per pt she is pre-diabetic   Splenic artery aneurysm ([facility]   1285mn MRI 02/2021    Tobacco History: Social History   Tobacco Use  Smoking Status Never  Smokeless Tobacco Never   Counseling given: Not Answered   Outpatient Medications Prior to Visit  Medication Sig Dispense Refill   albuterol (VENTOLIN HFA) 108 (90 Base) MCG/ACT inhaler Inhale 2 puffs into the lungs every 6 (six) hours as needed for wheezing or shortness of breath. 8 g 3   Ascorbic Acid (VITAMIN C) 1000 MG tablet Take 1,000 mg by mouth daily.     benzonatate (TESSALON) 100 MG capsule Take 1 capsule (100 mg total) by mouth 3 (three) times daily. 90 capsule 0   buPROPion (WELLBUTRIN XL) 300 MG 24 hr tablet TAKE 1 TABLET BY MOUTH EVERY DAY 90 tablet 2   cholecalciferol (VITAMIN D3) 25 MCG (1000 UNIT) tablet Take 1,000 Units by mouth daily.     citalopram (CELEXA) 20 MG tablet Take 1 tablet (20 mg total) by mouth daily. 90 tablet 1   EPINEPHrine 0.3 mg/0.3 mL IJ SOAJ injection INJECT 0.3 MG INTO THE MUSCLE ONCE FOR 1 DOSE.     fenofibrate 160 MG tablet Take 1 tablet (160 mg total) by mouth daily. Please make overdue appt with Dr. TurRadford Paxfore anymore refills. Thank you 2nd attempt 90 tablet 0   fexofenadine (ALLEGRA) 180 MG tablet Take 180 mg by  mouth daily.     fluticasone (FLONASE) 50 MCG/ACT nasal spray Place 2 sprays into both nostrils daily.     ipratropium (ATROVENT) 0.06 % nasal spray Place 1-2 sprays into both nostrils 4 (four) times daily. As needed for nasal congestion 15 mL 5   levocetirizine (XYZAL) 5 MG tablet Take a tablet 1-2 times daily during the spring/summer to keep the hives at bayLoveland combination with your Allegra daily. 60 tablet 3   Multiple Vitamin (MULTIVITAMIN) tablet Take 1 tablet by mouth daily.     Omega-3 Fatty Acids (FISH OIL) 1000 MG CAPS Take 1,000 mg by mouth daily.      pantoprazole (PROTONIX) 40 MG tablet Take 1 tablet (40 mg total) by mouth daily. 30 tablet 2   rosuvastatin (CRESTOR) 40 MG tablet TAKE 1 TABLET BY MOUTH DAILY. PLEASE MAKE OVERDUE APPT WITH DR. TURRadford PaxFORE ANYMORE REFILLS. THANK YOU 3RD AND FINAL ATTEMPT 90 tablet 2   triamcinolone ointment (KENALOG)  0.1 % Apply 1 application topically 2 (two) times daily. 80 g 1   HYDROcodone bit-homatropine (HYCODAN) 5-1.5 MG/5ML syrup Take 5 mLs by mouth at bedtime as needed for cough. 120 mL 0   predniSONE (DELTASONE) 10 MG tablet 3 tabs x3 days and then 2 tabs x3 days and then 1 tab x3 days.  Take w/ food. 18 tablet 0   Facility-Administered Medications Prior to Visit  Medication Dose Route Frequency Provider Last Rate Last Admin   omalizumab Arvid Right) injection 300 mg  300 mg Subcutaneous Q28 days Valentina Shaggy, MD   300 mg at 01/21/21 1009     Review of Systems:   Constitutional: No weight loss or gain, night sweats, fevers, chills, fatigue, or lassitude. HEENT: No headaches, difficulty swallowing, tooth/dental problems, or sore throat. No sneezing, itching, ear ache +nasal congestion, post nasal drip (both improved but persist), throat tickle  CV:  No chest pain, orthopnea, PND, swelling in lower extremities, anasarca, dizziness, palpitations, syncope Resp: +dry nocturnal cough (significantly improved; able to sleep at night w/o  difficulties). No shortness of breath with exertion or at rest. No excess mucus or change in color of mucus. No productive or non-productive. No hemoptysis. No wheezing.  No chest wall deformity GI:  No heartburn, indigestion, abdominal pain, nausea, vomiting, diarrhea, change in bowel habits, loss of appetite, bloody stools.  GU: No dysuria, change in color of urine, urgency or frequency.  No flank pain, no hematuria  Skin: No rash, lesions, ulcerations MSK:  No joint pain or swelling.  No decreased range of motion.  No back pain. Neuro: No dizziness or lightheadedness.  Psych: No depression or anxiety. Mood stable.     Physical Exam:  BP 124/80 (BP Location: Left Arm, Patient Position: Sitting, Cuff Size: Normal)    Pulse 74    Temp 98.1 F (36.7 C) (Oral)    Ht '5\' 2"'$  (1.575 m)    Wt 150 lb 6.4 oz (68.2 kg)    LMP 10/27/2017    SpO2 97% Comment: RA   BMI 27.51 kg/m   GEN: Pleasant, interactive, well-appearing; in no acute distress. HEENT:  Normocephalic and atraumatic. EACs patent bilaterally. TM pearly gray with present light reflex bilaterally. PERRLA. Sclera white. Nasal turbinates pink, moist and patent bilaterally. No rhinorrhea present. Oropharynx erythematous and moist, without exudate or edema. No lesions, ulcerations NECK:  Supple w/ fair ROM. No JVD present. Normal carotid impulses w/o bruits. Thyroid symmetrical with no goiter or nodules palpated. No lymphadenopathy.   CV: RRR, no m/r/g, no peripheral edema. Pulses intact, +2 bilaterally. No cyanosis, pallor or clubbing. PULMONARY:  Unlabored, regular breathing. Clear bilaterally A&P w/o wheezes/rales/rhonchi. No accessory muscle use. No dullness to percussion. GI: BS present and normoactive. Soft, non-tender to palpation. No organomegaly or masses detected. No CVA tenderness. MSK: No erythema, warmth or tenderness. Cap refil <2 sec all extrem. No deformities or joint swelling noted.  Neuro: A/Ox3. No focal deficits noted.    Skin: Warm, no lesions or rashe Psych: Normal affect and behavior. Judgement and thought content appropriate.     Lab Results:  CBC    Component Value Date/Time   WBC 6.2 02/20/2021 1054   RBC 4.96 02/20/2021 1054   HGB 14.9 02/20/2021 1054   HCT 44.0 02/20/2021 1054   PLT 325.0 02/20/2021 1054   MCV 88.8 02/20/2021 1054   MCH 29.4 08/17/2019 1148   MCHC 33.9 02/20/2021 1054   RDW 13.4 02/20/2021 1054   LYMPHSABS 1.5 07/27/2019  0943   MONOABS 0.3 07/27/2019 0943   EOSABS 0.3 07/27/2019 0943   BASOSABS 0.0 07/27/2019 0943    BMET    Component Value Date/Time   NA 137 02/20/2021 1054   NA 139 12/22/2019 0900   K 3.9 02/20/2021 1054   CL 103 02/20/2021 1054   CO2 26 02/20/2021 1054   GLUCOSE 91 02/20/2021 1054   BUN 12 02/20/2021 1054   BUN 8 12/22/2019 0900   CREATININE 0.68 02/20/2021 1054   CALCIUM 9.8 02/20/2021 1054   GFRNONAA 106 12/22/2019 0900   GFRAA 122 12/22/2019 0900    BNP No results found for: BNP   Imaging:  DG Chest 2 View  Result Date: 07/04/2021 CLINICAL DATA:  Follow-up pleural effusion. EXAM: CHEST - 2 VIEW COMPARISON:  June 18, 2021 FINDINGS: The heart size and mediastinal contours are within normal limits. No focal airspace consolidation. No pleural effusion or pneumothorax. The visualized skeletal structures are unchanged. IMPRESSION: Resolution of the previously visualized pleural effusions. No acute cardiopulmonary abnormality. Electronically Signed   By: Dahlia Bailiff M.D.   On: 07/04/2021 08:49    methylPREDNISolone acetate (DEPO-MEDROL) injection 80 mg     Date Action Dose Route User   07/11/2021 1213 Given 80 mg Intramuscular (Left Deltoid) Juliann Pulse, CMA       PFT Results Latest Ref Rng & Units 05/30/2020  FVC-Pre L 3.33  FVC-Predicted Pre % 111  FVC-Post L 3.41  FVC-Predicted Post % 114  Pre FEV1/FVC % % 87  Post FEV1/FCV % % 83  FEV1-Pre L 2.89  FEV1-Predicted Pre % 118  FEV1-Post L 2.83  DLCO uncorrected  ml/min/mmHg 24.22  DLCO UNC% % 112  DLCO corrected ml/min/mmHg 24.22  DLCO COR %Predicted % 112  DLVA Predicted % 116  TLC L 5.12  TLC % Predicted % 107  RV % Predicted % 105    No results found for: NITRICOXIDE      Assessment & Plan:   Upper airway cough syndrome Significantly improved. Continue with postnasal drainage and cough control. Chlortab at night for cough. Decrease Atrovent nasal spray to 1 spray Twice daily to help with dryness. Advised she could increase back if nasal symptoms/cough worsened.   Patient Instructions  -Continue Albuterol inhaler 2 puffs every 6 hours as needed for shortness of breath or wheezing. Notify if symptoms persist despite rescue inhaler/neb use. -Continue Allegra 1 tab daily -Continue Xyzal to 5 mg daily. Can take 1-2 times per day during spring/summer as prescribed by allergist  -Continue Flonase nasal spray 2 sprays each nostril daily -Continue prednisone taper as previously prescribed   -Decrease Atrovent nasal spray 1 sprays each nostril Twice daily. Can use up to 4 times a day for postnasal drainage/nasal congestion -Continue delsym 2 tsp Twice daily for cough -Chlorpheniramine 4 mg At bedtime for cough  -Continue protonix 40 mg daily  -Continue saline nasal irrigation 1-2 times per day   Frequent sips of water. Suck on sugar free hard candies throughout the day. Avoid throat clearing.    Follow up on Friday to get Xolair shot with allergy   Follow up with ENT once appointment is scheduled    Follow up in 3 months with Dr. [name]. If symptoms do not improve or worsen, please contact office for sooner follow up or seek emergency care.    Chronic rhinitis Awaiting ENT appointment. Continue with flonase and atrovent.   OSA (obstructive sleep apnea) Followed by Dr. Radford Pax. Continue CPAP as directed  Allergic  contact dermatitis Worsening allergies with warmer weather. Did advise restart of Xyzal at last visit. Plans to restart  Xolair this coming Friday. She was experiencing symptoms of chronic rhinitis previously when on Xolair so will continue with ENT referral. Follow up with allergist as scheduled.    I spent 35 minutes of dedicated to the care of this patient on the date of this encounter to include pre-visit review of records, face-to-face time with the patient discussing conditions above, post visit ordering of testing, clinical documentation with the electronic health record, making appropriate referrals as documented, and communicating necessary findings to members of the patients care team.  Clayton Bibles, NP 07/28/2021  Pt aware and understands NP's role.

## 2021-07-28 NOTE — Assessment & Plan Note (Signed)
Followed by Dr. Radford Pax. Continue CPAP as directed ?

## 2021-07-31 ENCOUNTER — Other Ambulatory Visit: Payer: Self-pay

## 2021-07-31 ENCOUNTER — Ambulatory Visit (INDEPENDENT_AMBULATORY_CARE_PROVIDER_SITE_OTHER): Payer: 59

## 2021-07-31 DIAGNOSIS — L501 Idiopathic urticaria: Secondary | ICD-10-CM | POA: Diagnosis not present

## 2021-07-31 NOTE — Progress Notes (Signed)
Immunotherapy ? ? ?Patient Details  ?Name: Ann Bass ?MRN: [id number] ?Date of Birth: [birth year] ? ?07/31/2021 ? ?Ann Bass here to restart Xolair for hives. Patient received 300 mg dose and waited with no problems.   ?Frequency: every 4 weeks ?Epi-Pen:Epi-Pen Available  ?Consent signed and patient instructions given. ? ? ?Herbie Drape ?07/31/2021, 11:00 AM ? ? ?

## 2021-08-05 ENCOUNTER — Other Ambulatory Visit: Payer: Self-pay | Admitting: Allergy & Immunology

## 2021-08-05 ENCOUNTER — Ambulatory Visit (HOSPITAL_COMMUNITY): Payer: 59 | Attending: Cardiology

## 2021-08-05 ENCOUNTER — Encounter: Payer: Self-pay | Admitting: Cardiology

## 2021-08-05 ENCOUNTER — Other Ambulatory Visit: Payer: Self-pay

## 2021-08-05 DIAGNOSIS — R0609 Other forms of dyspnea: Secondary | ICD-10-CM | POA: Diagnosis not present

## 2021-08-05 DIAGNOSIS — I491 Atrial premature depolarization: Secondary | ICD-10-CM | POA: Diagnosis not present

## 2021-08-05 DIAGNOSIS — I728 Aneurysm of other specified arteries: Secondary | ICD-10-CM | POA: Insufficient documentation

## 2021-08-05 DIAGNOSIS — I77810 Thoracic aortic ectasia: Secondary | ICD-10-CM | POA: Insufficient documentation

## 2021-08-05 DIAGNOSIS — G4733 Obstructive sleep apnea (adult) (pediatric): Secondary | ICD-10-CM | POA: Diagnosis not present

## 2021-08-05 DIAGNOSIS — E7800 Pure hypercholesterolemia, unspecified: Secondary | ICD-10-CM | POA: Diagnosis present

## 2021-08-05 LAB — ECHOCARDIOGRAM COMPLETE
Area-P 1/2: 4.49 cm2
S' Lateral: 2.4 cm

## 2021-08-06 ENCOUNTER — Telehealth: Payer: Self-pay | Admitting: Cardiology

## 2021-08-06 DIAGNOSIS — I77810 Thoracic aortic ectasia: Secondary | ICD-10-CM

## 2021-08-06 NOTE — Telephone Encounter (Signed)
-----   Message from Sueanne Margarita, MD sent at 08/05/2021  4:53 PM EDT ----- ?To the echo showed normal heart function with trivial leakiness of the mitral valve and moderate enlargement of the ascending aorta at 44 mm.  Please get a chest CTA gated to assess aortic enlargement further ?

## 2021-08-06 NOTE — Telephone Encounter (Signed)
The patient has been notified of the result and verbalized understanding.  All questions (if any) were answered. ?Antonieta Iba, RN 08/06/2021 4:14 PM  ? ?

## 2021-08-06 NOTE — Telephone Encounter (Signed)
Patient is returning RN's call for her Echo results.  ?

## 2021-08-20 ENCOUNTER — Other Ambulatory Visit: Payer: 59 | Admitting: *Deleted

## 2021-08-20 DIAGNOSIS — I77810 Thoracic aortic ectasia: Secondary | ICD-10-CM

## 2021-08-20 LAB — BASIC METABOLIC PANEL
BUN/Creatinine Ratio: 19 (ref 9–23)
BUN: 16 mg/dL (ref 6–24)
CO2: 25 mmol/L (ref 20–29)
Calcium: 10.5 mg/dL — ABNORMAL HIGH (ref 8.7–10.2)
Chloride: 101 mmol/L (ref 96–106)
Creatinine, Ser: 0.83 mg/dL (ref 0.57–1.00)
Glucose: 118 mg/dL — ABNORMAL HIGH (ref 70–99)
Potassium: 4.1 mmol/L (ref 3.5–5.2)
Sodium: 137 mmol/L (ref 134–144)
eGFR: 86 mL/min/{1.73_m2} (ref >59)

## 2021-08-23 ENCOUNTER — Other Ambulatory Visit: Payer: Self-pay | Admitting: Family Medicine

## 2021-08-25 ENCOUNTER — Other Ambulatory Visit: Payer: Self-pay | Admitting: Cardiology

## 2021-08-25 DIAGNOSIS — E7800 Pure hypercholesterolemia, unspecified: Secondary | ICD-10-CM

## 2021-08-27 ENCOUNTER — Ambulatory Visit (INDEPENDENT_AMBULATORY_CARE_PROVIDER_SITE_OTHER): Payer: 59 | Admitting: *Deleted

## 2021-08-27 DIAGNOSIS — L501 Idiopathic urticaria: Secondary | ICD-10-CM | POA: Diagnosis not present

## 2021-08-29 ENCOUNTER — Ambulatory Visit (HOSPITAL_COMMUNITY): Payer: 59

## 2021-09-09 ENCOUNTER — Ambulatory Visit (HOSPITAL_COMMUNITY)
Admission: RE | Admit: 2021-09-09 | Discharge: 2021-09-09 | Disposition: A | Discharge status: Home / Self Care | Payer: 59 | Source: Ambulatory Visit | Attending: Cardiology | Admitting: Cardiology

## 2021-09-09 DIAGNOSIS — I77810 Thoracic aortic ectasia: Secondary | ICD-10-CM | POA: Insufficient documentation

## 2021-09-09 MED ORDER — IOHEXOL 350 MG/ML SOLN
100.0000 mL | Freq: Once | INTRAVENOUS | Status: AC | PRN
  Administered 2021-09-09: 75 mL via INTRAVENOUS

## 2021-09-10 ENCOUNTER — Telehealth: Payer: Self-pay

## 2021-09-10 DIAGNOSIS — I77810 Thoracic aortic ectasia: Secondary | ICD-10-CM

## 2021-09-10 DIAGNOSIS — R0609 Other forms of dyspnea: Secondary | ICD-10-CM

## 2021-09-10 NOTE — Telephone Encounter (Signed)
-----   Message from Sueanne Margarita, MD sent at 09/09/2021  4:18 PM EDT ----- ?Chest CTA shows stable thoracic aortic aneurysm measuring 4.2 cm in the ascending aorta measured 4.1 cm on MRI 03/06/2021.  Please repeat  gaited MRI/MRA of the chest in 1 year ?

## 2021-09-10 NOTE — Telephone Encounter (Signed)
The patient has been notified of the result and verbalized understanding.  All questions (if any) were answered. ?Ann Iba, RN 09/10/2021 11:44 AM  ? ?

## 2021-09-24 ENCOUNTER — Ambulatory Visit (INDEPENDENT_AMBULATORY_CARE_PROVIDER_SITE_OTHER): Payer: 59

## 2021-09-24 DIAGNOSIS — L501 Idiopathic urticaria: Secondary | ICD-10-CM

## 2021-10-08 ENCOUNTER — Other Ambulatory Visit: Payer: Self-pay | Admitting: Nurse Practitioner

## 2021-10-08 DIAGNOSIS — R058 Other specified cough: Secondary | ICD-10-CM

## 2021-10-09 ENCOUNTER — Other Ambulatory Visit (HOSPITAL_COMMUNITY): Payer: 59

## 2021-10-23 ENCOUNTER — Ambulatory Visit (INDEPENDENT_AMBULATORY_CARE_PROVIDER_SITE_OTHER): Payer: 59

## 2021-10-23 DIAGNOSIS — L501 Idiopathic urticaria: Secondary | ICD-10-CM

## 2021-10-28 ENCOUNTER — Ambulatory Visit: Payer: 59 | Admitting: Internal Medicine

## 2021-11-06 ENCOUNTER — Ambulatory Visit: Payer: 59 | Admitting: Allergy & Immunology

## 2021-11-13 ENCOUNTER — Ambulatory Visit: Payer: 59 | Admitting: Allergy & Immunology

## 2021-11-21 ENCOUNTER — Ambulatory Visit (INDEPENDENT_AMBULATORY_CARE_PROVIDER_SITE_OTHER): Payer: 59

## 2021-11-21 DIAGNOSIS — L501 Idiopathic urticaria: Secondary | ICD-10-CM | POA: Diagnosis not present

## 2021-12-09 ENCOUNTER — Ambulatory Visit (INDEPENDENT_AMBULATORY_CARE_PROVIDER_SITE_OTHER): Payer: 59 | Admitting: Allergy & Immunology

## 2021-12-09 ENCOUNTER — Encounter: Payer: Self-pay | Admitting: Allergy & Immunology

## 2021-12-09 VITALS — BP 122/78 | HR 76 | Temp 97.8°F | Resp 16 | Ht 62.0 in | Wt 150.0 lb

## 2021-12-09 DIAGNOSIS — L239 Allergic contact dermatitis, unspecified cause: Secondary | ICD-10-CM

## 2021-12-09 DIAGNOSIS — J301 Allergic rhinitis due to pollen: Secondary | ICD-10-CM | POA: Diagnosis not present

## 2021-12-09 DIAGNOSIS — L501 Idiopathic urticaria: Secondary | ICD-10-CM | POA: Diagnosis not present

## 2021-12-09 NOTE — Patient Instructions (Addendum)
1. Seasonal allergic rhinitis due to pollen (trees, outdoor molds) with overlying urticaria - Continue taking: Allegra daily as well as Xyzal as needed. - Stop the Flonase for now and start Ryaltris one spray per nostril daily to see if this helps with the postnasal drip more than the Flonase.   2. Seasonal urticaria  - We will continue with Xolair monthly as we are doing now. - Continue with the antihistamines. - We can space it out if we need to since you do not like the pain of the injections.     3 Metal sensitivity (chromium and cobalt)  - Avoid triggering jewelry items.   4. Return in about 6 months (around 06/11/2022).    Please inform us of any Emergency Department visits, hospitalizations, or changes in symptoms. Call us before going to the ED for breathing or allergy symptoms since we might be able to fit you in for a sick visit. Feel free to contact us anytime with any questions, problems, or concerns.  It was a pleasure to see you again today!  Websites that have reliable patient information: 1. American Academy of Asthma, Allergy, and Immunology: www.aaaai.org 2. Food Allergy Research and Education (FARE): foodallergy.org 3. Mothers of Asthmatics: http://www.asthmacommunitynetwork.org 4. American College of Allergy, Asthma, and Immunology: www.acaai.org   COVID-19 Vaccine Information can be found at: ShippingScam.co.uk For questions related to vaccine distribution or appointments, please email [email or web address] or call [phone].   We realize that you might be concerned about having an allergic reaction to the COVID19 vaccines. To help with that concern, WE ARE OFFERING THE COVID19 VACCINES IN OUR OFFICE! Ask the front desk for dates!     "Like" [place] on Facebook and Instagram for our latest updates!      A healthy democracy works best when [employer] participate! Make sure you are registered to vote! If  you have moved or changed any of your contact information, you will need to get this updated before voting!  In some cases, you MAY be able to register to vote online: [email or web address]

## 2021-12-09 NOTE — Progress Notes (Signed)
FOLLOW UP  Date of Service/Encounter:  12/09/21   Assessment:   Seasonal allergic rhinitis due to pollen (trees, outdoor molds)   Rash that appears more urticarial per the pictures (occurs March through August) - marked improved with the initiation of Xolair, now remaining on it throughout the year since she worsened so much when she was off of it    Contact dermatitis (chromium and cobalt)   Elevated LFTs - followed by her PCP   Bilateral hearing loss - with hearing aids placed  Normal sinus CT (May 2023)  Plan/Recommendations:   1. Seasonal allergic rhinitis due to pollen (trees, outdoor molds) with overlying urticaria - Continue taking: Allegra daily as well as Xyzal as needed. - Stop the Flonase for now and start Ryaltris one spray per nostril daily to see if this helps with the postnasal drip more than the Flonase.   2. Seasonal urticaria  - We will continue with Xolair monthly as we are doing now. - Continue with the antihistamines. - We can space it out if we need to since you do not like the pain of the injections.     3 Metal sensitivity (chromium and cobalt)  - Avoid triggering jewelry items.   4. Return in about 6 months (around 06/11/2022).    Subjective:   Ann Bass is a 51 y.o. 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   Allergic contact dermatitis 08/09/2020   Rash 08/09/2020   Abnormal uterine bleeding 08/24/2019   Steatosis of liver 01/13/2019   Renal cyst, left 01/13/2019   Overweight (BMI 25.0-29.9) 01/13/2019   OSA (obstructive sleep apnea) 09/02/2017   Ascending aorta dilatation (HCC)    SOB (shortness of breath) 02/18/2017   Excessive daytime sleepiness 02/18/2017    Vitamin D deficiency 09/17/2016   Physical exam 09/13/2014   Palpitations 09/13/2014   Decreased hearing of both ears 09/13/2014   Glucose intolerance (impaired glucose tolerance) 02/22/2013   Depression 02/22/2013   Hypothyroid 02/22/2013    History obtained from: chart review and patient.  Ann Bass is a 51 y.o. female presenting for a follow up visit. She was last seen in June 2022. At that time, we was continued with levocetirizine 5 mg 1-2 times daily. We also continued with Xolair monthly and recommended weaning the Allegra. She continued to avoid chromium and cobalt.   Since the last visit, she has mostly done well.   Allergic Rhinitis Symptom History: She remains on daily fenofexadine and levocetirizine as needed. She has Flonase and eye drops to use as neede.d She ends up dusing them throughout the year. She has a good handle on her smyptoms.   She does have constant rhinorrhea throughout the year. It runs all of the time. She has not used any nose spray aside from Flonase. She did use ipratropium for a period of time. Flonase has not helped a ton with it. The ipratropium did calm down the rhinorrhea but it was still present.   She did see [facility].  She had a CT sinus that showed minimal mucosal thickening and clear sinuses.  Skin Symptom History: She did try to get off the Xolair during winter months. This  did not work at all. She was off of the Xolair from September through March, but she had some breakthrough urticaria. The shots hurt like crazy.  But it worth the pain since it controls her rash very well.   She had a cough that was prolonged over the winter. She got prednisone, antibiotics, and inhalers to try to get right of this. It took around 3 months. She saw Marland Kitchen, NP, for a couple of visits. She previously saw Dr. [name], but this was back in early 2022.   She was unable to go to [place] before her parents sold her childhood home.  She does not seem to sad about this. She grew up is [place], which is a suburb of [place]. She hates the city and does not seem sad about never going there again.   Otherwise, there have been no changes to her past medical history, surgical history, family history, or social history.    Review of Systems  Constitutional: Negative.  Negative for fever, malaise/fatigue and weight loss.  HENT: Negative.  Negative for congestion, ear discharge and ear pain.        Positive for rhinorrhea.   Eyes:  Negative for pain, discharge and redness.  Respiratory:  Negative for cough, sputum production, shortness of breath and wheezing.   Cardiovascular: Negative.  Negative for chest pain and palpitations.  Gastrointestinal:  Negative for abdominal pain, heartburn, nausea and vomiting.  Skin: Negative.  Negative for itching and rash.  Neurological:  Negative for dizziness and headaches.  Endo/Heme/Allergies:  Negative for environmental allergies. Does not bruise/bleed easily.       Objective:   Blood pressure 122/78, pulse 76, temperature 97.8 F (36.6 C), resp. rate 16, height '5\' 2"'$  (1.575 m), weight 150 lb (68 kg), last menstrual period 10/27/2017, SpO2 97 %. Body mass index is 27.44 kg/m.    Physical Exam Vitals reviewed.  Constitutional:      Appearance: She is well-developed.  HENT:     Head: Normocephalic and atraumatic.     Right Ear: Tympanic membrane, ear canal and external ear normal.     Left Ear: Tympanic membrane, ear canal and external ear normal.     Nose: No nasal deformity, septal deviation, mucosal edema or rhinorrhea.     Right Turbinates: Enlarged and swollen.     Left Turbinates: Enlarged and swollen.     Right Sinus: No maxillary sinus tenderness or frontal sinus tenderness.     Left Sinus: No maxillary sinus tenderness or frontal sinus tenderness.     Mouth/Throat:     Mouth: Mucous membranes are not pale and not dry.     Pharynx: Uvula midline.  Eyes:      General: Lids are normal. No allergic shiner.       Right eye: No discharge.        Left eye: No discharge.     Conjunctiva/sclera: Conjunctivae normal.     Right eye: Right conjunctiva is not injected. No chemosis.    Left eye: Left conjunctiva is not injected. No chemosis.    Pupils: Pupils are equal, round, and reactive to light.  Cardiovascular:     Rate and Rhythm: Normal rate and regular rhythm.     Heart sounds: Normal heart sounds.  Pulmonary:     Effort: Pulmonary effort is normal. No tachypnea, accessory muscle usage or respiratory distress.     Breath sounds: Normal breath sounds. No wheezing, rhonchi or rales.     Comments: Moving  air well in all lung fields.  No increased work of breathing. Chest:     Chest wall: No tenderness.  Lymphadenopathy:     Cervical: No cervical adenopathy.  Skin:    Coloration: Skin is not pale.     Findings: No abrasion, erythema, petechiae or rash. Rash is not papular, urticarial or vesicular.  Neurological:     Mental Status: She is alert.  Psychiatric:        Behavior: Behavior is cooperative.      Diagnostic studies: none        Salvatore Marvel, MD  [facility] [place] [employer]

## 2021-12-19 ENCOUNTER — Ambulatory Visit (INDEPENDENT_AMBULATORY_CARE_PROVIDER_SITE_OTHER): Payer: 59

## 2021-12-19 DIAGNOSIS — L501 Idiopathic urticaria: Secondary | ICD-10-CM | POA: Diagnosis not present

## 2021-12-24 ENCOUNTER — Other Ambulatory Visit: Payer: Self-pay | Admitting: Cardiology

## 2021-12-24 DIAGNOSIS — E7800 Pure hypercholesterolemia, unspecified: Secondary | ICD-10-CM

## 2021-12-24 LAB — HM MAMMOGRAPHY

## 2022-01-16 ENCOUNTER — Ambulatory Visit (INDEPENDENT_AMBULATORY_CARE_PROVIDER_SITE_OTHER): Payer: 59 | Admitting: *Deleted

## 2022-01-16 DIAGNOSIS — L501 Idiopathic urticaria: Secondary | ICD-10-CM | POA: Diagnosis not present

## 2022-01-17 ENCOUNTER — Other Ambulatory Visit: Payer: Self-pay | Admitting: Family Medicine

## 2022-02-13 ENCOUNTER — Ambulatory Visit (INDEPENDENT_AMBULATORY_CARE_PROVIDER_SITE_OTHER): Payer: 59

## 2022-02-13 DIAGNOSIS — L501 Idiopathic urticaria: Secondary | ICD-10-CM | POA: Diagnosis not present

## 2022-03-12 ENCOUNTER — Ambulatory Visit (INDEPENDENT_AMBULATORY_CARE_PROVIDER_SITE_OTHER): Payer: 59 | Admitting: *Deleted

## 2022-03-12 DIAGNOSIS — L501 Idiopathic urticaria: Secondary | ICD-10-CM | POA: Diagnosis not present

## 2022-04-15 ENCOUNTER — Ambulatory Visit (INDEPENDENT_AMBULATORY_CARE_PROVIDER_SITE_OTHER): Payer: 59

## 2022-04-15 DIAGNOSIS — L501 Idiopathic urticaria: Secondary | ICD-10-CM | POA: Diagnosis not present

## 2022-05-03 ENCOUNTER — Other Ambulatory Visit: Payer: Self-pay | Admitting: Family Medicine

## 2022-05-05 ENCOUNTER — Encounter: Payer: Self-pay | Admitting: Family Medicine

## 2022-05-05 ENCOUNTER — Ambulatory Visit (INDEPENDENT_AMBULATORY_CARE_PROVIDER_SITE_OTHER): Payer: 59 | Admitting: Family Medicine

## 2022-05-05 ENCOUNTER — Ambulatory Visit (HOSPITAL_BASED_OUTPATIENT_CLINIC_OR_DEPARTMENT_OTHER)
Admission: RE | Admit: 2022-05-05 | Discharge: 2022-05-05 | Disposition: A | Discharge status: Home / Self Care | Payer: 59 | Source: Ambulatory Visit | Attending: Family Medicine | Admitting: Family Medicine

## 2022-05-05 VITALS — BP 112/78 | HR 68 | Temp 97.4°F | Resp 18 | Ht 62.0 in | Wt 153.1 lb

## 2022-05-05 DIAGNOSIS — R5383 Other fatigue: Secondary | ICD-10-CM

## 2022-05-05 DIAGNOSIS — E785 Hyperlipidemia, unspecified: Secondary | ICD-10-CM | POA: Diagnosis not present

## 2022-05-05 DIAGNOSIS — E041 Nontoxic single thyroid nodule: Secondary | ICD-10-CM | POA: Diagnosis not present

## 2022-05-05 DIAGNOSIS — Z23 Encounter for immunization: Secondary | ICD-10-CM

## 2022-05-05 DIAGNOSIS — F341 Dysthymic disorder: Secondary | ICD-10-CM

## 2022-05-05 DIAGNOSIS — R7302 Impaired glucose tolerance (oral): Secondary | ICD-10-CM

## 2022-05-05 LAB — BASIC METABOLIC PANEL
BUN: 12 mg/dL (ref 6–23)
CO2: 29 mEq/L (ref 19–32)
Calcium: 10.1 mg/dL (ref 8.4–10.5)
Chloride: 101 mEq/L (ref 96–112)
Creatinine, Ser: 0.69 mg/dL (ref 0.40–1.20)
GFR: 100.62 mL/min (ref >60.00)
Glucose, Bld: 89 mg/dL (ref 70–99)
Potassium: 4.3 mEq/L (ref 3.5–5.1)
Sodium: 137 mEq/L (ref 135–145)

## 2022-05-05 LAB — HEPATIC FUNCTION PANEL
ALT: 55 U/L — ABNORMAL HIGH (ref 0–35)
AST: 35 U/L (ref 0–37)
Albumin: 4.7 g/dL (ref 3.5–5.2)
Alkaline Phosphatase: 59 U/L (ref 39–117)
Bilirubin, Direct: 0.1 mg/dL (ref 0.0–0.3)
Total Bilirubin: 0.5 mg/dL (ref 0.2–1.2)
Total Protein: 6.8 g/dL (ref 6.0–8.3)

## 2022-05-05 LAB — CBC WITH DIFFERENTIAL/PLATELET
Basophils Absolute: 0.1 10*3/uL (ref 0.0–0.1)
Basophils Relative: 0.9 % (ref 0.0–3.0)
Eosinophils Absolute: 0.3 10*3/uL (ref 0.0–0.7)
Eosinophils Relative: 4.6 % (ref 0.0–5.0)
HCT: 46.9 % — ABNORMAL HIGH (ref 36.0–46.0)
Hemoglobin: 16 g/dL — ABNORMAL HIGH (ref 12.0–15.0)
Lymphocytes Relative: 28.5 % (ref 12.0–46.0)
Lymphs Abs: 1.6 10*3/uL (ref 0.7–4.0)
MCHC: 34.1 g/dL (ref 30.0–36.0)
MCV: 88.7 fl (ref 78.0–100.0)
Monocytes Absolute: 0.4 10*3/uL (ref 0.1–1.0)
Monocytes Relative: 6.4 % (ref 3.0–12.0)
Neutro Abs: 3.3 10*3/uL (ref 1.4–7.7)
Neutrophils Relative %: 59.6 % (ref 43.0–77.0)
Platelets: 305 10*3/uL (ref 150.0–400.0)
RBC: 5.29 Mil/uL — ABNORMAL HIGH (ref 3.87–5.11)
RDW: 13.2 % (ref 11.5–15.5)
WBC: 5.6 10*3/uL (ref 4.0–10.5)

## 2022-05-05 LAB — LIPID PANEL
Cholesterol: 126 mg/dL (ref 0–200)
HDL: 50.3 mg/dL (ref >39.00)
LDL Cholesterol: 53 mg/dL (ref 0–99)
NonHDL: 75.8
Total CHOL/HDL Ratio: 3
Triglycerides: 115 mg/dL (ref 0.0–149.0)
VLDL: 23 mg/dL (ref 0.0–40.0)

## 2022-05-05 LAB — TSH: TSH: 2.82 u[IU]/mL (ref 0.35–5.50)

## 2022-05-05 LAB — LUTEINIZING HORMONE: LH: 4.25 m[IU]/mL

## 2022-05-05 LAB — FOLLICLE STIMULATING HORMONE: FSH: 18.7 m[IU]/mL

## 2022-05-05 LAB — HEMOGLOBIN A1C: Hgb A1c MFr Bld: 5.8 % (ref 4.6–6.5)

## 2022-05-05 LAB — VITAMIN D 25 HYDROXY (VIT D DEFICIENCY, FRACTURES): VITD: 76.64 ng/mL (ref 30.00–100.00)

## 2022-05-05 LAB — SEDIMENTATION RATE: Sed Rate: 4 mm/hr (ref 0–30)

## 2022-05-05 LAB — B12 AND FOLATE PANEL
Folate: 18.7 ng/mL (ref >5.9)
Vitamin B-12: 1441 pg/mL — ABNORMAL HIGH (ref 211–911)

## 2022-05-05 NOTE — Patient Instructions (Addendum)
Schedule your complete physical in 6 months We'll notify you of your lab results and make any changes if needed We'll call you to schedule your thyroid ultrasound No med changes at this time Continue to work on healthy diet and regular exercise- you can do it! Call with any questions or concerns Stay Safe!  Stay Healthy! Happy Holidays!!

## 2022-05-05 NOTE — Progress Notes (Signed)
   Subjective:    Patient ID: Ann Bass, female    DOB: August 02, 1970, 51 y.o.   MRN: [id number]  HPI Impaired glucose tolerance- pt had gestational diabetes and mom has diabetes.  Fatigue- pt reports that 'nothing is helping'.  Homeopathic provider encouraged her to test for autoimmune conditions.  Some joint pain, 'extreme heat intolerance', 'brain fog'.  Has ongoing issues w/ rashes.  + SOB.  Pt has had hysterectomy so unable to go by cycles.  Hyperlipidemia- chronic problem on Crestor '40mg'$  daily and Fenofibrate '160mg'$  daily.  No CP, abd pain, N/V.  Depression- ongoing issue for pt.  Currently on Wellbutrin '300mg'$  daily and Citalopram '20mg'$  daily.  Feels sxs are dependent on how she is physically feeling.  Review of Systems For ROS see HPI     Objective:   Physical Exam Vitals reviewed.  Constitutional:      General: She is not in acute distress.    Appearance: Normal appearance. She is well-developed. She is not ill-appearing.  HENT:     Head: Normocephalic and atraumatic.  Eyes:     Conjunctiva/sclera: Conjunctivae normal.     Pupils: Pupils are equal, round, and reactive to light.  Neck:     Thyroid: Thyroid mass (possible anterior thyroid nodule) present. No thyromegaly or thyroid tenderness.  Cardiovascular:     Rate and Rhythm: Normal rate and regular rhythm.     Heart sounds: Normal heart sounds. No murmur heard. Pulmonary:     Effort: Pulmonary effort is normal. No respiratory distress.     Breath sounds: Normal breath sounds.  Abdominal:     General: There is no distension.     Palpations: Abdomen is soft.     Tenderness: There is no abdominal tenderness.  Musculoskeletal:     Cervical back: Normal range of motion and neck supple.  Lymphadenopathy:     Cervical: No cervical adenopathy.  Skin:    General: Skin is warm and dry.  Neurological:     Mental Status: She is alert and oriented to person, place, and time.  Psychiatric:        Behavior: Behavior  normal.           Assessment & Plan:  Thyroid nodule- new.  [place] ordered.

## 2022-05-06 ENCOUNTER — Telehealth: Payer: Self-pay

## 2022-05-06 ENCOUNTER — Other Ambulatory Visit: Payer: Self-pay

## 2022-05-06 DIAGNOSIS — R748 Abnormal levels of other serum enzymes: Secondary | ICD-10-CM

## 2022-05-06 NOTE — Telephone Encounter (Signed)
-----   Message from [name], MD sent at 05/05/2022  9:09 PM EST ----- Labs look good!  ALT (liver enzyme) is again mildly elevated but this tends to fluctuate.  After the holidays, please hold alcohol and tylenol x2 weeks and we'll repeat your liver enzymes at a lab only visit in mid-January.  Your hemoglobin is mildly elevated but this may just mean that you need to drink more water so it's not a concentrated  No evidence of diabetes  Cholesterol looks great!!  No evidence of inflammatory process, still waiting on autoimmune marker.  Not currently in menopause but your symptoms could still be related as we approach that milestone

## 2022-05-06 NOTE — Telephone Encounter (Signed)
Patient has called back and been informed

## 2022-05-06 NOTE — Telephone Encounter (Signed)
Left pt a vm to call office in regards to lab results and repeat liver enzymes order is in

## 2022-05-07 ENCOUNTER — Telehealth: Payer: Self-pay

## 2022-05-07 LAB — ANTI-NUCLEAR AB-TITER (ANA TITER)
ANA TITER: 1:80 {titer} — ABNORMAL HIGH
ANA TITER: 1:80 {titer} — ABNORMAL HIGH
ANA Titer 1: 1:80 {titer} — ABNORMAL HIGH

## 2022-05-07 LAB — ANA: Anti Nuclear Antibody (ANA): POSITIVE — AB

## 2022-05-07 NOTE — Telephone Encounter (Signed)
I have left another VM asking pt to call office

## 2022-05-07 NOTE — Telephone Encounter (Signed)
Left pt a VM  to call office in regards to [place] results

## 2022-05-07 NOTE — Telephone Encounter (Signed)
-----   Message from [name], MD sent at 05/07/2022  7:38 AM EST ----- There is a 1 cm nodule on the L side that they recommend imaging again in 1 year to ensure stability.  Nothing to do at this time

## 2022-05-08 ENCOUNTER — Other Ambulatory Visit: Payer: Self-pay

## 2022-05-08 ENCOUNTER — Telehealth: Payer: Self-pay

## 2022-05-08 DIAGNOSIS — R768 Other specified abnormal immunological findings in serum: Secondary | ICD-10-CM

## 2022-05-08 NOTE — Telephone Encounter (Signed)
Ann Bass pt a VM to call office in regards to her lab results Referral for Rheumatologist has been placed as well

## 2022-05-08 NOTE — Telephone Encounter (Signed)
Pt called back and was in formed

## 2022-05-08 NOTE — Telephone Encounter (Signed)
-----   Message from [name], MD sent at 05/08/2022  7:45 AM EST ----- Your ANA titer (marker of possible autoimmune conditions) is slightly positive.  Based on this and the symptoms that you've been having, we're going to refer you to Rheumatology (dx + ANA).  I know they are really backed up and the appointment may not be for awhile, but we'll get the ball rolling.

## 2022-05-13 ENCOUNTER — Ambulatory Visit (INDEPENDENT_AMBULATORY_CARE_PROVIDER_SITE_OTHER): Payer: 59

## 2022-05-13 DIAGNOSIS — L501 Idiopathic urticaria: Secondary | ICD-10-CM | POA: Diagnosis not present

## 2022-05-19 ENCOUNTER — Encounter: Payer: Self-pay | Admitting: Family Medicine

## 2022-05-22 ENCOUNTER — Ambulatory Visit (INDEPENDENT_AMBULATORY_CARE_PROVIDER_SITE_OTHER): Payer: 59

## 2022-05-22 DIAGNOSIS — Z23 Encounter for immunization: Secondary | ICD-10-CM

## 2022-05-22 DIAGNOSIS — R748 Abnormal levels of other serum enzymes: Secondary | ICD-10-CM | POA: Diagnosis not present

## 2022-05-22 LAB — HEPATIC FUNCTION PANEL
ALT: 39 U/L — ABNORMAL HIGH (ref 0–35)
AST: 25 U/L (ref 0–37)
Albumin: 4.4 g/dL (ref 3.5–5.2)
Alkaline Phosphatase: 50 U/L (ref 39–117)
Bilirubin, Direct: 0.1 mg/dL (ref 0.0–0.3)
Total Bilirubin: 0.5 mg/dL (ref 0.2–1.2)
Total Protein: 6.7 g/dL (ref 6.0–8.3)

## 2022-05-22 NOTE — Progress Notes (Signed)
Patient presented today for first shingles vaccine we discussed the side effects and recommended follow up time period, patient is also having repeat hepatic function panel

## 2022-05-25 ENCOUNTER — Telehealth: Payer: Self-pay

## 2022-05-25 NOTE — Telephone Encounter (Signed)
-----   Message from [name], MD sent at 05/25/2022  7:36 AM EST ----- Liver functions look much better!  Great news!

## 2022-05-25 NOTE — Telephone Encounter (Signed)
Informed pt of lab results  

## 2022-05-31 NOTE — Assessment & Plan Note (Signed)
Ongoing issue for pt.  She reports her sxs depend on how she is physically feeling.  Currently on Wellbutrin '300mg'$  daily and Citalopram '20mg'$  daily.  Will hold on med changes at this time as we work on her physical sxs.  Pt expressed understanding and is in agreement w/ plan.

## 2022-05-31 NOTE — Assessment & Plan Note (Signed)
Chronic problem.  Currently on Crestor '40mg'$  daily and Fenofibrate '160mg'$  daily w/o difficulty.  Check labs.  Adjust meds prn

## 2022-05-31 NOTE — Assessment & Plan Note (Signed)
Pt is concerned for possible DM since she had gestational DM w/ all 3 pregnancies and mom has DM.  Check A1C and will continue to monitor.

## 2022-05-31 NOTE — Assessment & Plan Note (Signed)
Pt reports that nothing is helping her ongoing fatigue.  Seeing homeopathic provider that encouraged her to test for autoimmune conditions.  She has had a hysterectomy so she is not sure if she is having menopausal symptoms at this time.  Will check labs to assess hormone levels, possible autoimmune processes, thyroid concerns, anemia.  Will determine next steps based on lab results.

## 2022-06-10 ENCOUNTER — Ambulatory Visit: Payer: 59

## 2022-06-11 ENCOUNTER — Ambulatory Visit: Payer: 59 | Admitting: Allergy & Immunology

## 2022-06-23 ENCOUNTER — Other Ambulatory Visit: Payer: Self-pay

## 2022-06-23 ENCOUNTER — Ambulatory Visit: Payer: 59

## 2022-06-23 ENCOUNTER — Other Ambulatory Visit: Payer: Self-pay | Admitting: *Deleted

## 2022-06-23 ENCOUNTER — Encounter: Payer: Self-pay | Admitting: Allergy & Immunology

## 2022-06-23 ENCOUNTER — Ambulatory Visit (INDEPENDENT_AMBULATORY_CARE_PROVIDER_SITE_OTHER): Payer: 59 | Admitting: Allergy & Immunology

## 2022-06-23 VITALS — BP 132/70 | HR 68 | Temp 98.4°F | Resp 18 | Ht 62.0 in | Wt 158.6 lb

## 2022-06-23 DIAGNOSIS — R21 Rash and other nonspecific skin eruption: Secondary | ICD-10-CM | POA: Diagnosis not present

## 2022-06-23 DIAGNOSIS — J301 Allergic rhinitis due to pollen: Secondary | ICD-10-CM

## 2022-06-23 DIAGNOSIS — L501 Idiopathic urticaria: Secondary | ICD-10-CM | POA: Diagnosis not present

## 2022-06-23 DIAGNOSIS — L239 Allergic contact dermatitis, unspecified cause: Secondary | ICD-10-CM

## 2022-06-23 MED ORDER — OMALIZUMAB 150 MG/ML ~~LOC~~ SOSY: PREFILLED_SYRINGE | SUBCUTANEOUS | 11 refills | Status: DC

## 2022-06-23 NOTE — Patient Instructions (Addendum)
1. Seasonal allergic rhinitis due to pollen (trees, outdoor molds) with overlying urticaria - Continue taking: Allegra daily as well as Xyzal as needed. - Continue with Flonase as needed.   2. Seasonal urticaria  - We will continue with Xolair monthly as we are doing now. - Continue with the antihistamines  . 3 Metal sensitivity (chromium and cobalt)  - Avoid triggering jewelry items.   4. Return in about 1 year (around 06/24/2023).    Please inform us of any Emergency Department visits, hospitalizations, or changes in symptoms. Call us before going to the ED for breathing or allergy symptoms since we might be able to fit you in for a sick visit. Feel free to contact us anytime with any questions, problems, or concerns.  It was a pleasure to see you again today!  Websites that have reliable patient information: 1. American Academy of Asthma, Allergy, and Immunology: www.aaaai.org 2. Food Allergy Research and Education (FARE): foodallergy.org 3. Mothers of Asthmatics: http://www.asthmacommunitynetwork.org 4. American College of Allergy, Asthma, and Immunology: www.acaai.org   COVID-19 Vaccine Information can be found at: ShippingScam.co.uk For questions related to vaccine distribution or appointments, please email [email or web address] or call [phone].   We realize that you might be concerned about having an allergic reaction to the COVID19 vaccines. To help with that concern, WE ARE OFFERING THE COVID19 VACCINES IN OUR OFFICE! Ask the front desk for dates!     "Like" [place] on Facebook and Instagram for our latest updates!      A healthy democracy works best when [employer] participate! Make sure you are registered to vote! If you have moved or changed any of your contact information, you will need to get this updated before voting!  In some cases, you MAY be able to register to vote online:  [email or web address]

## 2022-06-23 NOTE — Progress Notes (Signed)
FOLLOW UP  Date of Service/Encounter:  06/23/22   Assessment:   Seasonal allergic rhinitis due to pollen (trees, outdoor molds)   Rash that appears more urticarial per the pictures (occurs March through August) - marked improved with the initiation of Xolair, now remaining on it throughout the year since she worsened so much when she was off of it    Contact dermatitis (chromium and cobalt)   Elevated LFTs - followed by her PCP   Bilateral hearing loss - with hearing aids placed   Normal sinus CT (May 2023)    Plan/Recommendations:   1. Seasonal allergic rhinitis due to pollen (trees, outdoor molds) with overlying urticaria - Continue taking: Allegra daily as well as Xyzal as needed. - Continue with Flonase as needed.   2. Seasonal urticaria  - We will continue with Xolair monthly as we are doing now. - Continue with the antihistamines  . 3 Metal sensitivity (chromium and cobalt)  - Avoid triggering jewelry items.   4. Return in about 1 year (around 06/24/2023).    Subjective:   SUMMERLYN FICKEL is a 52 y.o. 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   Allergic contact dermatitis 08/09/2020   Rash 08/09/2020   Steatosis of liver 01/13/2019   Renal cyst, left 01/13/2019   Overweight (BMI 25.0-29.9) 01/13/2019   OSA (obstructive sleep apnea) 09/02/2017   Ascending aorta dilatation ([facility])    SOB (shortness of breath) 02/18/2017   Excessive daytime sleepiness 02/18/2017   Vitamin D deficiency 09/17/2016   Physical exam 09/13/2014   Palpitations 09/13/2014   Decreased hearing of both ears  09/13/2014   Glucose intolerance (impaired glucose tolerance) 02/22/2013   Depression 02/22/2013   Hypothyroid 02/22/2013    History obtained from: chart review and patient.  Aseneth is a 52 y.o. female presenting for a follow up visit.  We last saw her in July 2023.  At that time, we continued with Allegra and Xyzal.  We stopped the Flonase and started Ryaltris 1 spray per nostril daily.  For her urticaria, we continue with Xolair monthly.  She continued to avoid chromium and cobalt.  Since last visit, she has done very well.   Allergic Rhinitis Symptom History: Environmental allergies are fairly well controlled. She will use the levocetirizine. But she does not do that daily. It does knock her out.  She does take Allegra every day.   Skin Symptom History: Hives are under good control. She does have some breakouts but they are few and far between. She had one last summer and none over the winter or fall.  She was on Xolair seasonally for a bit but then she went back the monthly. Her copay card works, but she still pays down her deductible.   She is seeing Dr. Estanislado Pandy in June for evaluation of fatigue. Her PCP has been tweaking her medications to help with this. It has been ongoing since she started having children.   Her three kids are doing well. Almost all are teens now. Her oldest is graduating this year and going to [place]. Her middle child does home  schooling. The youngest goes to [employer].   Her parents are in [place]. They are trying to go out there next Christmas. Her parents are living there ins a senior living community. They are 13 hours ahead.   Otherwise, there have been no changes to her past medical history, surgical history, family history, or social history.    Review of Systems  Constitutional: Negative.  Negative for chills, fever, malaise/fatigue and weight loss.  HENT: Negative.  Negative for congestion, ear discharge and ear pain.        Positive for  rhinorrhea.   Eyes:  Negative for pain, discharge and redness.  Respiratory:  Negative for cough, sputum production, shortness of breath and wheezing.   Cardiovascular: Negative.  Negative for chest pain and palpitations.  Gastrointestinal:  Negative for abdominal pain, heartburn, nausea and vomiting.  Skin: Negative.  Negative for itching and rash.  Neurological:  Negative for dizziness and headaches.  Endo/Heme/Allergies:  Positive for environmental allergies. Does not bruise/bleed easily.       Objective:   Blood pressure 132/70, pulse 68, temperature 98.4 F (36.9 C), temperature source Temporal, resp. rate 18, height '5\' 2"'$  (1.575 m), weight 158 lb 9.6 oz (71.9 kg), last menstrual period 10/27/2017, SpO2 95 %. Body mass index is 29.01 kg/m.    Physical Exam Vitals reviewed.  Constitutional:      Appearance: She is well-developed and normal weight.  HENT:     Head: Normocephalic and atraumatic.     Right Ear: Tympanic membrane, ear canal and external ear normal.     Left Ear: Tympanic membrane, ear canal and external ear normal.     Nose: No nasal deformity, septal deviation, mucosal edema or rhinorrhea.     Right Turbinates: Enlarged, swollen and pale.     Left Turbinates: Enlarged, swollen and pale.     Right Sinus: No maxillary sinus tenderness or frontal sinus tenderness.     Left Sinus: No maxillary sinus tenderness or frontal sinus tenderness.     Mouth/Throat:     Mouth: Mucous membranes are not pale and not dry.     Pharynx: Uvula midline.  Eyes:     General: Lids are normal. No allergic shiner.       Right eye: No discharge.        Left eye: No discharge.     Conjunctiva/sclera: Conjunctivae normal.     Right eye: Right conjunctiva is not injected. No chemosis.    Left eye: Left conjunctiva is not injected. No chemosis.    Pupils: Pupils are equal, round, and reactive to light.  Cardiovascular:     Rate and Rhythm: Normal rate and regular rhythm.     Heart  sounds: Normal heart sounds.  Pulmonary:     Effort: Pulmonary effort is normal. No tachypnea, accessory muscle usage or respiratory distress.     Breath sounds: Normal breath sounds. No wheezing, rhonchi or rales.     Comments: Moving air well in all lung fields.  No increased work of breathing. Chest:     Chest wall: No tenderness.  Lymphadenopathy:     Cervical: No cervical adenopathy.  Skin:    Coloration: Skin is not pale.     Findings: No abrasion, erythema, petechiae or rash. Rash is not papular, urticarial or vesicular.  Neurological:     Mental Status: She is alert.  Psychiatric:        Behavior: Behavior is cooperative.      Diagnostic studies: none  Salvatore Marvel, MD  [facility] [facility] [place]

## 2022-06-24 ENCOUNTER — Ambulatory Visit: Payer: 59

## 2022-06-24 ENCOUNTER — Ambulatory Visit (INDEPENDENT_AMBULATORY_CARE_PROVIDER_SITE_OTHER): Payer: 59 | Admitting: Podiatry

## 2022-06-24 DIAGNOSIS — M357 Hypermobility syndrome: Secondary | ICD-10-CM

## 2022-06-24 DIAGNOSIS — M21611 Bunion of right foot: Secondary | ICD-10-CM

## 2022-06-24 DIAGNOSIS — M2011 Hallux valgus (acquired), right foot: Secondary | ICD-10-CM

## 2022-06-24 DIAGNOSIS — M21961 Unspecified acquired deformity of right lower leg: Secondary | ICD-10-CM | POA: Diagnosis not present

## 2022-06-24 NOTE — Progress Notes (Signed)
  Subjective:  Patient ID: Ann Bass, female    DOB: 04-Jul-1970,  MRN: [id number]  Chief Complaint  Patient presents with   Bunions    Right foot, 2 months    52 y.o. female presents with the above complaint. History confirmed with patient.  The right foot is worse than the left of the left foot does not bother her much currently.  She has had the bunion for a long time but is becoming increasingly painful.  Having a difficult time finding appropriate shoes she has tried wider shoes and modified her activity levels to alleviate pain and pressure here.  Objective:  Physical Exam: warm, good capillary refill, no trophic changes or ulcerative lesions, normal DP and PT pulses, and normal sensory exam.  Bilaterally with right worse than left in severity she has a hallux valgus deformity with prominent medial first metatarsal head and bunion deformity.  There is hypermobility of the first ray bilaterally.  Tenderness over the medial eminence.   Radiographs: Multiple views x-ray of the right foot: Moderate to severe hallux valgus deformity with frontal plane rotation of the first ray and deviation of the sesamoids, increased intermetatarsal angle and increased hallux abductus angle, joint appears to be maintained.  She has an elongated second ray Assessment:   1. Hallux valgus with bunions, right   2. Hypermobile joint syndrome of foot   3. Deformity of metatarsal bone of right foot      Plan:  Patient was evaluated and treated and all questions answered.  Discussed the etiology and treatment including surgical and non surgical treatment for painful bunions.  She has exhausted all non surgical treatment prior to this visit including shoe gear changes and padding.  She desires surgical intervention. We discussed all risks including but not limited to: pain, swelling, infection, scar, numbness which may be temporary or permanent, chronic pain, stiffness, nerve pain or damage, wound healing  problems, bone healing problems including delayed or non-union and recurrence. Specifically we discussed the following procedures: Lapidus bunionectomy of the right foot with possible Akin osteotomy, shortening Weil osteotomy of the second ray.  We discussed the canal for each procedure as we evaluated her x-rays.  Informed consent was signed today. Surgery will be scheduled at a mutually agreeable date. Information regarding this will be forwarded to our surgery scheduler.  All questions addressed    Surgical plan:  Procedure: -Right foot Lapidus bunionectomy, bone graft from heel, possible Akin osteotomy, Weil osteotomy  Location: -[facility]  Anesthesia plan: -IV sedation with regional block  Postoperative pain plan: - Tylenol 1000 mg every 6 hours, ibuprofen 600 mg every 6 hours, gabapentin 300 mg every 8 hours x5 days, oxycodone 5 mg 1-2 tabs every 6 hours only as needed  DVT prophylaxis: -None required  WB Restrictions / DME needs: -NWB with knee scooter after surgery in splint, transition to POV #1 boot    Return for after surgery.

## 2022-06-25 ENCOUNTER — Ambulatory Visit: Payer: 59 | Admitting: Podiatry

## 2022-07-07 ENCOUNTER — Telehealth: Payer: Self-pay

## 2022-07-07 NOTE — Telephone Encounter (Signed)
Knee scooter order placed with [facility] for her surgery on 10/23/2022.

## 2022-07-22 ENCOUNTER — Ambulatory Visit: Payer: 59

## 2022-07-27 ENCOUNTER — Encounter: Payer: Self-pay | Admitting: Family Medicine

## 2022-07-30 ENCOUNTER — Ambulatory Visit (INDEPENDENT_AMBULATORY_CARE_PROVIDER_SITE_OTHER): Payer: 59

## 2022-07-30 DIAGNOSIS — L501 Idiopathic urticaria: Secondary | ICD-10-CM | POA: Diagnosis not present

## 2022-08-01 ENCOUNTER — Other Ambulatory Visit: Payer: Self-pay | Admitting: Family Medicine

## 2022-08-18 ENCOUNTER — Ambulatory Visit (INDEPENDENT_AMBULATORY_CARE_PROVIDER_SITE_OTHER): Payer: 59 | Admitting: Family Medicine

## 2022-08-18 VITALS — BP 118/76 | HR 71 | Temp 98.2°F | Resp 17 | Ht 62.0 in | Wt 153.0 lb

## 2022-08-18 DIAGNOSIS — E663 Overweight: Secondary | ICD-10-CM

## 2022-08-18 DIAGNOSIS — R6889 Other general symptoms and signs: Secondary | ICD-10-CM

## 2022-08-18 DIAGNOSIS — E162 Hypoglycemia, unspecified: Secondary | ICD-10-CM

## 2022-08-18 LAB — HEPATIC FUNCTION PANEL
ALT: 42 U/L — ABNORMAL HIGH (ref 0–35)
AST: 28 U/L (ref 0–37)
Albumin: 4.4 g/dL (ref 3.5–5.2)
Alkaline Phosphatase: 49 U/L (ref 39–117)
Bilirubin, Direct: 0.1 mg/dL (ref 0.0–0.3)
Total Bilirubin: 0.5 mg/dL (ref 0.2–1.2)
Total Protein: 6.6 g/dL (ref 6.0–8.3)

## 2022-08-18 LAB — FOLLICLE STIMULATING HORMONE: FSH: 6.4 m[IU]/mL

## 2022-08-18 LAB — LUTEINIZING HORMONE: LH: 5.69 m[IU]/mL

## 2022-08-18 LAB — CBC WITH DIFFERENTIAL/PLATELET
Basophils Absolute: 0.1 10*3/uL (ref 0.0–0.1)
Basophils Relative: 0.7 % (ref 0.0–3.0)
Eosinophils Absolute: 0.2 10*3/uL (ref 0.0–0.7)
Eosinophils Relative: 3 % (ref 0.0–5.0)
HCT: 45.4 % (ref 36.0–46.0)
Hemoglobin: 15.4 g/dL — ABNORMAL HIGH (ref 12.0–15.0)
Lymphocytes Relative: 21.6 % (ref 12.0–46.0)
Lymphs Abs: 1.7 10*3/uL (ref 0.7–4.0)
MCHC: 33.8 g/dL (ref 30.0–36.0)
MCV: 88.8 fl (ref 78.0–100.0)
Monocytes Absolute: 0.4 10*3/uL (ref 0.1–1.0)
Monocytes Relative: 4.9 % (ref 3.0–12.0)
Neutro Abs: 5.4 10*3/uL (ref 1.4–7.7)
Neutrophils Relative %: 69.8 % (ref 43.0–77.0)
Platelets: 311 10*3/uL (ref 150.0–400.0)
RBC: 5.12 Mil/uL — ABNORMAL HIGH (ref 3.87–5.11)
RDW: 13.2 % (ref 11.5–15.5)
WBC: 7.8 10*3/uL (ref 4.0–10.5)

## 2022-08-18 LAB — BASIC METABOLIC PANEL
BUN: 11 mg/dL (ref 6–23)
CO2: 26 mEq/L (ref 19–32)
Calcium: 9.9 mg/dL (ref 8.4–10.5)
Chloride: 104 mEq/L (ref 96–112)
Creatinine, Ser: 0.63 mg/dL (ref 0.40–1.20)
GFR: 102.64 mL/min (ref >60.00)
Glucose, Bld: 86 mg/dL (ref 70–99)
Potassium: 3.9 mEq/L (ref 3.5–5.1)
Sodium: 135 mEq/L (ref 135–145)

## 2022-08-18 LAB — HEMOGLOBIN A1C: Hgb A1c MFr Bld: 5.7 % (ref 4.6–6.5)

## 2022-08-18 LAB — TSH: TSH: 2.6 u[IU]/mL (ref 0.35–5.50)

## 2022-08-18 LAB — VITAMIN D 25 HYDROXY (VIT D DEFICIENCY, FRACTURES): VITD: 71.34 ng/mL (ref 30.00–100.00)

## 2022-08-18 NOTE — Patient Instructions (Signed)
Schedule your complete physical in 3 months We'll notify you of your lab results and make any changes if needed Try and eat a low carb, low sugar diet ADD a magnesium supplement- stay under 350mg  daily ADD a B complex vitamin Start regular exercise- even if it's 10 minutes and increase as you are able Call with any questions or concerns Hang in there!!

## 2022-08-18 NOTE — Assessment & Plan Note (Addendum)
Ongoing issue for pt.  She is not exercising due to fatigue and heat intolerance.  Discussed the importance of a low carb diet for both weight loss benefit and to lessen heat intolerance/hot flashes.  She reports craving carbs.  Will start Magnesium and B complex supplements.  Stressed that exercise is something you have to pay into in order to get the benefits and that once she starts, she will have more energy than if she remains sedentary.  Will continue to follow.

## 2022-08-18 NOTE — Progress Notes (Signed)
   Subjective:    Patient ID: Ann Bass, female    DOB: 02-07-1971, 52 y.o.   MRN: [id number]  HPI 'sugar crash'- pt reports she is becoming symptomatic more frequently.    'extreme heat intolerance'- pt is fearful she won't 'make it through the summer'.  Reports she has had numerous tests run that were unrevealing.  Wonders if sxs are weight related.  Pt stopped having periods w/ hysterectomy but has ovaries remaining.  'constantly hot.  I'm sweating all the time'.    Overweight- pt reports she is trying to add 'a little bit of exercise'.  Is 'trying to alter my diet'- trying to eat healthier.  'i tend to crave carbs'.    Review of Systems For ROS see HPI     Objective:   Physical Exam Vitals reviewed.  Constitutional:      General: She is not in acute distress.    Appearance: Normal appearance. She is well-developed. She is not ill-appearing.  HENT:     Head: Normocephalic and atraumatic.  Eyes:     Conjunctiva/sclera: Conjunctivae normal.     Pupils: Pupils are equal, round, and reactive to light.  Neck:     Thyroid: No thyromegaly.  Cardiovascular:     Rate and Rhythm: Normal rate and regular rhythm.     Pulses: Normal pulses.     Heart sounds: Normal heart sounds. No murmur heard. Pulmonary:     Effort: Pulmonary effort is normal. No respiratory distress.     Breath sounds: Normal breath sounds.  Abdominal:     General: There is no distension.     Palpations: Abdomen is soft.     Tenderness: There is no abdominal tenderness.  Musculoskeletal:     Cervical back: Normal range of motion and neck supple.     Right lower leg: No edema.     Left lower leg: No edema.  Lymphadenopathy:     Cervical: No cervical adenopathy.  Skin:    General: Skin is warm and dry.  Neurological:     Mental Status: She is alert and oriented to person, place, and time.  Psychiatric:        Behavior: Behavior normal.           Assessment & Plan:   Hypoglycemia- pt reports she  is having 'sugar crashes' throughout the day.  She is worried as she previously had gestational diabetes.  Discussed need for increased protein throughout the day to stabilize blood sugar and to avoid large amounts of carbs that will cause sugars to spike and then drop.  Talked about need for mid-morning, late afternoon snack to keep sugar levels stable.  Check A1C to r/o diabetes.  Will follow.  Heat intolerance- pt reports she is hot and sweaty 'all the time'.  She is s/p hysterectomy but does have ovaries.  Discussed that this could be related to menopause- will check labs.  Encouraged low carb diet to help w/ temperature regulation.  If sxs continue and labs are all normal, can consider HRT or [place].  Pt expressed understanding and is in agreement w/ plan.

## 2022-08-19 ENCOUNTER — Telehealth: Payer: Self-pay

## 2022-08-19 NOTE — Telephone Encounter (Signed)
-----   Message from [name], MD sent at 08/19/2022  7:37 AM EDT ----- Labs are stable and look good.  No evidence of diabetes.  You are not yet post-menopausal but you are likely in the peri-menopausal stage (during which hormones are very irregular and you can be quite symptomatic).  I do think a lot of your symptoms will improve w/ a lower carb diet but we will monitor closely.

## 2022-08-19 NOTE — Telephone Encounter (Signed)
Left results on pt VM  

## 2022-08-26 ENCOUNTER — Ambulatory Visit (INDEPENDENT_AMBULATORY_CARE_PROVIDER_SITE_OTHER): Payer: 59

## 2022-08-26 DIAGNOSIS — L501 Idiopathic urticaria: Secondary | ICD-10-CM

## 2022-09-01 ENCOUNTER — Telehealth (HOSPITAL_COMMUNITY): Payer: Self-pay | Admitting: *Deleted

## 2022-09-01 NOTE — Telephone Encounter (Signed)
Attempted to call patient regarding upcoming MRI appointment. Left message on voicemail with name and callback number  Baruc Tugwell RN Navigator Cardiac Imaging [facility] [phone] Office [phone] Cell  

## 2022-09-02 ENCOUNTER — Ambulatory Visit (HOSPITAL_COMMUNITY)
Admission: RE | Admit: 2022-09-02 | Discharge: 2022-09-02 | Disposition: A | Discharge status: Home / Self Care | Payer: 59 | Source: Ambulatory Visit | Attending: Cardiology | Admitting: Cardiology

## 2022-09-02 ENCOUNTER — Ambulatory Visit (HOSPITAL_COMMUNITY): Payer: 59

## 2022-09-02 ENCOUNTER — Encounter: Payer: Self-pay | Admitting: Cardiology

## 2022-09-02 DIAGNOSIS — R0609 Other forms of dyspnea: Secondary | ICD-10-CM | POA: Insufficient documentation

## 2022-09-02 DIAGNOSIS — I77810 Thoracic aortic ectasia: Secondary | ICD-10-CM | POA: Insufficient documentation

## 2022-09-02 MED ORDER — GADOBUTROL 1 MMOL/ML IV SOLN
7.0000 mL | Freq: Once | INTRAVENOUS | Status: AC | PRN
  Administered 2022-09-02: 7 mL via INTRAVENOUS

## 2022-09-03 ENCOUNTER — Other Ambulatory Visit: Payer: Self-pay | Admitting: Cardiology

## 2022-09-03 DIAGNOSIS — E7800 Pure hypercholesterolemia, unspecified: Secondary | ICD-10-CM

## 2022-09-04 ENCOUNTER — Ambulatory Visit (INDEPENDENT_AMBULATORY_CARE_PROVIDER_SITE_OTHER): Payer: 59

## 2022-09-04 DIAGNOSIS — Z23 Encounter for immunization: Secondary | ICD-10-CM | POA: Diagnosis not present

## 2022-09-04 NOTE — Progress Notes (Signed)
Patient presented today for 2nd and final Shingles vaccine Shingrix

## 2022-09-09 ENCOUNTER — Telehealth: Payer: Self-pay

## 2022-09-09 DIAGNOSIS — I77810 Thoracic aortic ectasia: Secondary | ICD-10-CM

## 2022-09-09 NOTE — Telephone Encounter (Signed)
-----   Message from Ethelda Chick, RN sent at 09/02/2022  5:45 PM EDT -----  ----- Message ----- From: Quintella Reichert, MD Sent: 09/02/2022   4:56 PM EDT To: Sheliah Hatch, MD; [address] Triage  Please let patient know that her ascending aorta has increased in size from 4.1cm by chest MRA 2022 to 4.4cm today.  Please repeat MRA of chest in 1 year.  She needs to avoid any upper body weight lifting.  Splenic artery aneurysm remains present. Forward to PCP.

## 2022-09-09 NOTE — Telephone Encounter (Signed)
Reviewed MRA results with patient who is very knowledgable about her condition. She states she has an upcoming appt with vein and vascular surgeons to also monitor her condition and agrees to 1 year repeat MRA. Order for repeat MRA placed. Patient verbalizes understanding that splenic aneurysm remains present, ascending aorta has increased in size. Discussed recommendations to avoid and upper body weight lifting, such as lifting kids or pets, lifting an adult family member who needs caregiving, lifting weights at the gym. Forwarded results to PCP.

## 2022-09-21 ENCOUNTER — Other Ambulatory Visit: Payer: Self-pay

## 2022-09-21 DIAGNOSIS — I77810 Thoracic aortic ectasia: Secondary | ICD-10-CM

## 2022-09-23 ENCOUNTER — Ambulatory Visit (INDEPENDENT_AMBULATORY_CARE_PROVIDER_SITE_OTHER): Payer: 59

## 2022-09-23 ENCOUNTER — Telehealth: Payer: Self-pay | Admitting: Gastroenterology

## 2022-09-23 DIAGNOSIS — L501 Idiopathic urticaria: Secondary | ICD-10-CM

## 2022-09-23 NOTE — Telephone Encounter (Signed)
Inbound call from patient, wishing to schedule a colonoscopy. Last procedure done In 2017 with [facility], wants all of her providers under [facility]. Will call previous office and have records sent.

## 2022-09-24 ENCOUNTER — Telehealth: Payer: Self-pay

## 2022-09-24 NOTE — Telephone Encounter (Signed)
Pt called asking if the imaging she just had done was sufficient or if she still needed what had been ordered by Dr. Myra Gianotti, she would need to reschedule.  Confirmed with Jetta, CMA, that she would need that testing.  Called pt, two identifiers used. Pt had called the office just a few minutes prior and rescheduled. Informed her she would still need those CT's. She stated they would have to wait since she was having surgery in June. She also stated that her insurance company was pushing for the CT's to be done at an imaging center instead of a hospital. Informed her that I would forward that information to [place] to be addressed closer to date. With more time to work with, it will hopefully be easier to get scheduled with [facility]. Confirmed understanding.

## 2022-09-28 ENCOUNTER — Ambulatory Visit: Payer: 59 | Admitting: Surgery

## 2022-09-29 ENCOUNTER — Telehealth: Payer: Self-pay | Admitting: Urology

## 2022-09-29 NOTE — Telephone Encounter (Signed)
DOS - 10/23/22  AIKEN OSTEOTOMY RIGHT --- 16109 LAPIDUS [employer] BUNIONECTOMY RIGHT --- [phone] METATARSAL OSTEOTOMY 2ND RIGHT --- 28308 BONE GRAFT RIGHT --- 20900  Monia Pouch   SPOKE WITH MAHARNI WITH [employer] AND SHE STATED THAT FOR CPT CODES 09811, [phone], [phone] AND 20900 NO PRIOR AUTH IS REQUIRED.  CALL REF # [id number]

## 2022-09-30 NOTE — Telephone Encounter (Signed)
Good Morning Dr.Dorsey,  Patient dropped off medical records for you to review. States she would like to have you has her provider. Please advise on scheduling. Thank you.

## 2022-09-30 NOTE — Telephone Encounter (Signed)
Reviewed records. Patient's father was diagnosed with colon cancer at age 52. Okay to schedule for direct colonoscopy.  Colonoscopy 02/03/16: Medium internal hemorrhoids. Otherwise normal. Good prep.

## 2022-10-02 ENCOUNTER — Encounter: Payer: Self-pay | Admitting: Internal Medicine

## 2022-10-02 NOTE — Telephone Encounter (Signed)
LVM for patient to call back and schedule

## 2022-10-05 ENCOUNTER — Telehealth: Payer: Self-pay | Admitting: *Deleted

## 2022-10-05 NOTE — Telephone Encounter (Signed)
Attempt to reach pt to change pre-visit appt date. LM with call back #.

## 2022-10-21 ENCOUNTER — Ambulatory Visit (INDEPENDENT_AMBULATORY_CARE_PROVIDER_SITE_OTHER): Payer: 59 | Admitting: *Deleted

## 2022-10-21 DIAGNOSIS — L501 Idiopathic urticaria: Secondary | ICD-10-CM | POA: Diagnosis not present

## 2022-10-21 IMAGING — CT CT CARDIAC CORONARY ARTERY CALCIUM SCORE
3 series · 14 of 20 positions shown, 15 images · non-contrast
Comparison: Chest CT 12/22/2018.
COMPARISON: Chest CT 12/22/2018.

Addendum:
EXAM:
OVER-READ INTERPRETATION  CT CHEST

The following report is an over-read performed by radiologist Dr.
Suulka Priestley-Smith [REDACTED] on 03/26/2020. This
over-read does not include interpretation of cardiac or coronary
anatomy or pathology. The coronary calcium score interpretation by
the cardiologist is attached.
CLINICAL DATA: Risk stratification
Coronary Calcium Score
TECHNIQUE: The patient was scanned on a Siemens Force scanner. Axial
non-contrast 3 mm slices were carried out through the heart. The
data set was analyzed on a dedicated work station and scored using
the Agatson method.

[Series 2: casc 3.0 bv41 2 bestdiast 72 % · axial · 0.33mm/px · z∈[-198,-129]mm · 4 of 39 slices shown, 5 images]
[im 8/39  vessel]
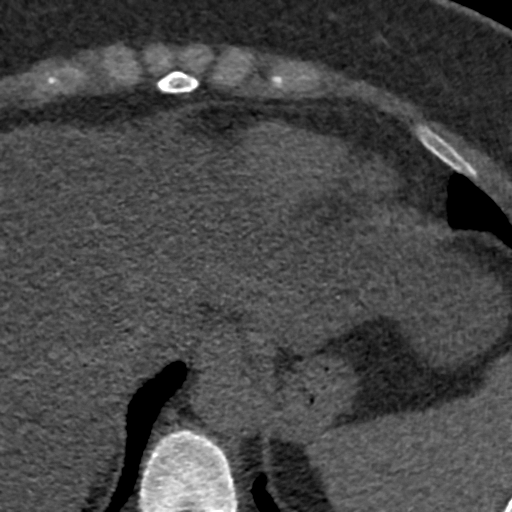
[im 8/39  lung]
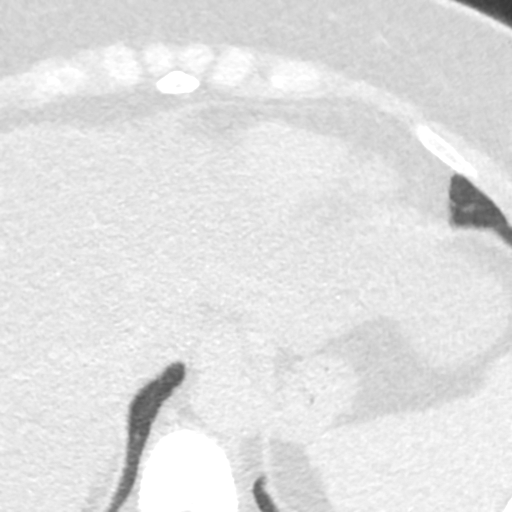
[im 16/39  vessel]
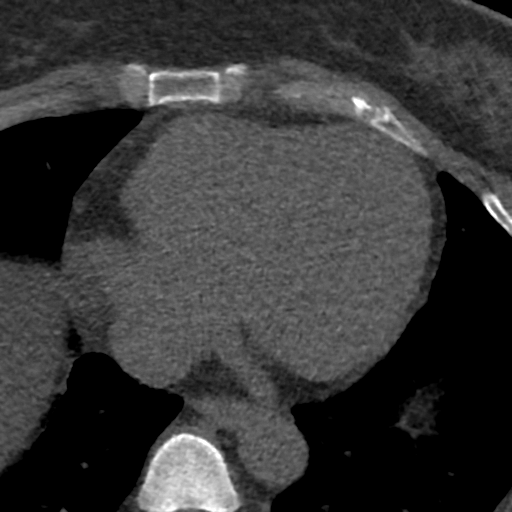
[im 23/39  vessel]
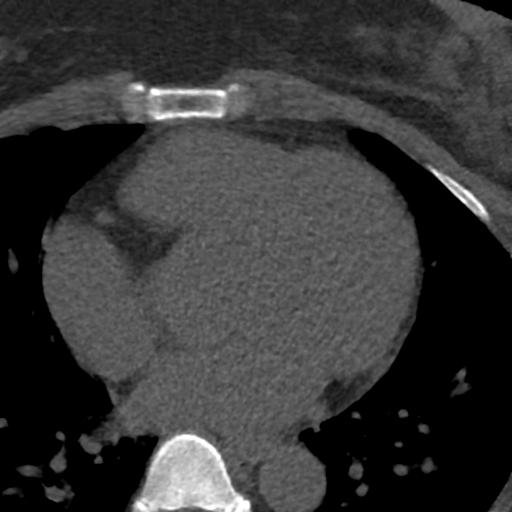
[im 31/39  vessel]
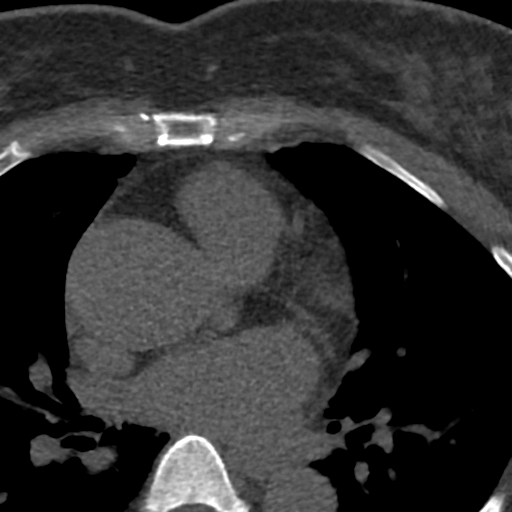

[Series 3: lung 72 % · axial · 0.64mm/px · z∈[-200,-126]mm · 5 of 39 slices shown]
[im 7/39  lung]
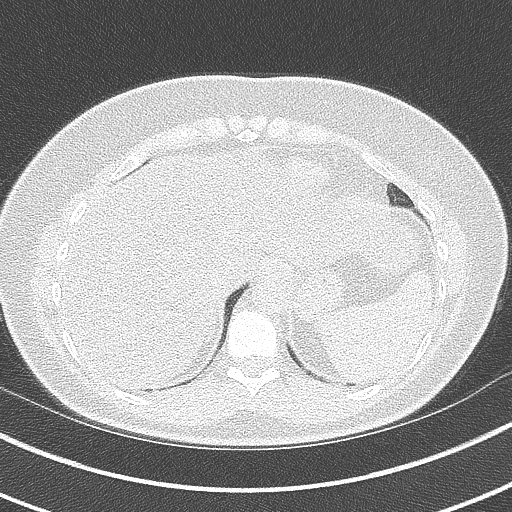
[im 13/39  lung]
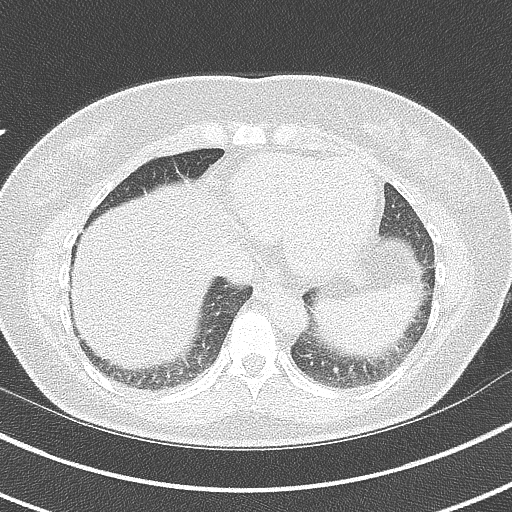
[im 20/39  lung]
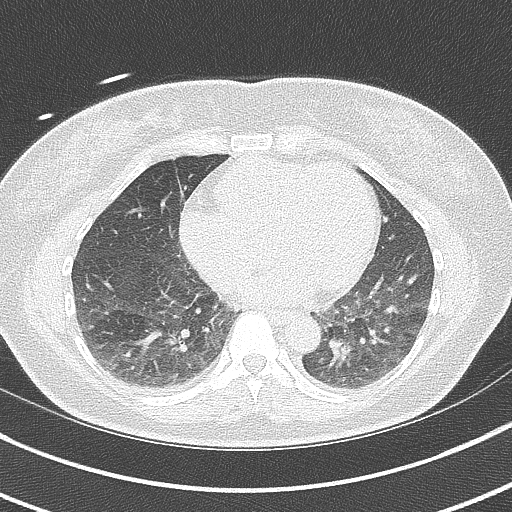
[im 26/39  lung]
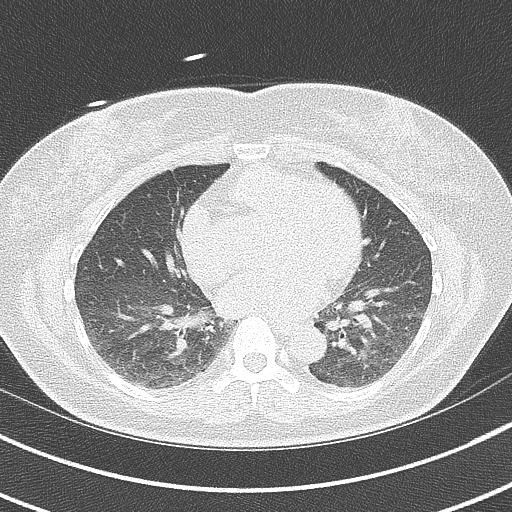
[im 32/39  lung]
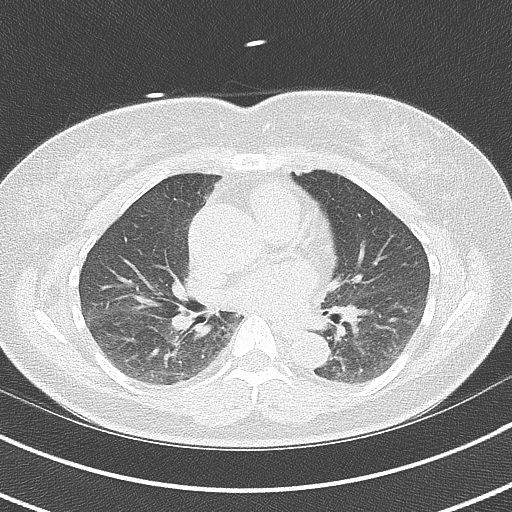

[Series 4: lung st 72 % · axial · 0.64mm/px · z∈[-200,-126]mm · 5 of 39 slices shown]
[im 7/39  lung]
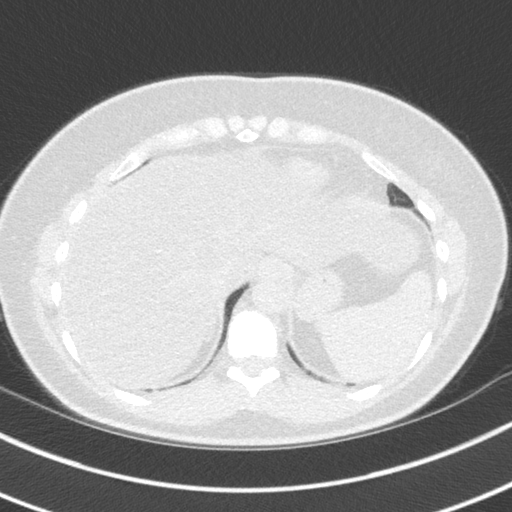
[im 13/39  lung]
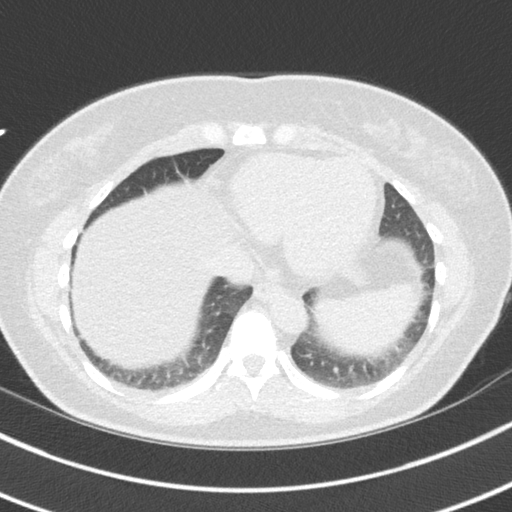
[im 20/39  lung]
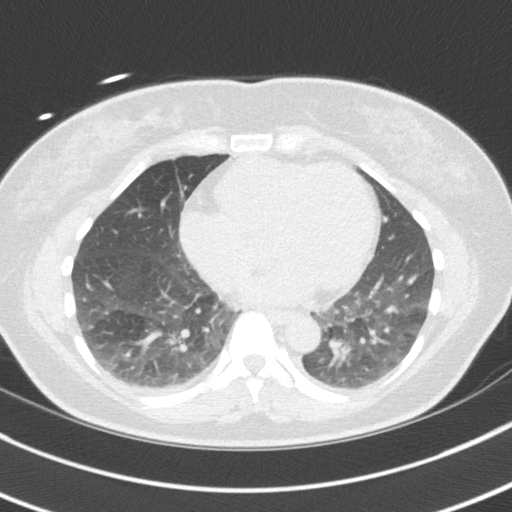
[im 26/39  lung]
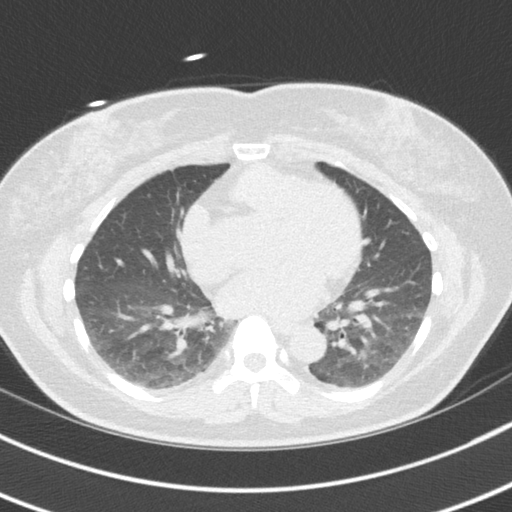
[im 32/39  lung]
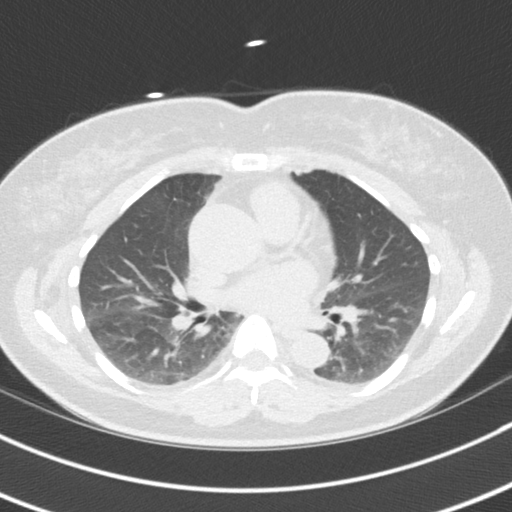

[14 of 20 positions shown; findings below may reference images not displayed]

FINDINGS: Within the visualized portions of the thorax there are no suspicious
appearing pulmonary nodules or masses, there is no acute
consolidative airspace disease, no pleural effusions, no
pneumothorax and no lymphadenopathy. Visualized portions of the
upper abdomen demonstrates severe diffuse low attenuation throughout
the visualized hepatic parenchyma, indicative of severe hepatic
steatosis. There are no aggressive appearing lytic or blastic
lesions noted in the visualized portions of the skeleton.
IMPRESSION: 1. Severe hepatic steatosis.
FINDINGS: Non-cardiac: See separate report from [REDACTED].

Ascending Aorta: Mildly dilated ascending aorta at 42mm (level of PA
bifurcation).

Pericardium: Normal

Coronary arteries: Normal coronary origins.
IMPRESSION: Coronary calcium score of 0. This was 0 percentile for age and sex
matched control.

Hadar Amy

*** End of Addendum ***
EXAM:
OVER-READ INTERPRETATION  CT CHEST

The following report is an over-read performed by radiologist Dr.
Suulka Priestley-Smith [REDACTED] on 03/26/2020. This
over-read does not include interpretation of cardiac or coronary
anatomy or pathology. The coronary calcium score interpretation by
the cardiologist is attached.
FINDINGS: Within the visualized portions of the thorax there are no suspicious
appearing pulmonary nodules or masses, there is no acute
consolidative airspace disease, no pleural effusions, no
pneumothorax and no lymphadenopathy. Visualized portions of the
upper abdomen demonstrates severe diffuse low attenuation throughout
the visualized hepatic parenchyma, indicative of severe hepatic
steatosis. There are no aggressive appearing lytic or blastic
lesions noted in the visualized portions of the skeleton.
IMPRESSION: 1. Severe hepatic steatosis.

## 2022-10-23 ENCOUNTER — Other Ambulatory Visit: Payer: Self-pay | Admitting: Podiatry

## 2022-10-23 DIAGNOSIS — M2011 Hallux valgus (acquired), right foot: Secondary | ICD-10-CM

## 2022-10-23 HISTORY — PX: OTHER SURGICAL HISTORY: SHX169

## 2022-10-23 HISTORY — PX: BUNIONECTOMY: SHX129

## 2022-10-23 MED ORDER — ACETAMINOPHEN 500 MG PO TABS: 1000.0000 mg | ORAL_TABLET | Freq: Four times a day (QID) | ORAL | 0 refills | Status: AC | PRN

## 2022-10-23 MED ORDER — OXYCODONE HCL 5 MG PO TABS: 5.0000 mg | ORAL_TABLET | ORAL | 0 refills | Status: AC | PRN

## 2022-10-23 MED ORDER — IBUPROFEN 600 MG PO TABS: 600.0000 mg | ORAL_TABLET | Freq: Three times a day (TID) | ORAL | 0 refills | Status: AC | PRN

## 2022-10-23 MED ORDER — GABAPENTIN 300 MG PO CAPS: 300.0000 mg | ORAL_CAPSULE | Freq: Three times a day (TID) | ORAL | 0 refills | Status: DC

## 2022-10-23 NOTE — Progress Notes (Signed)
10/23/22 R foot bunionectomy

## 2022-10-26 NOTE — Progress Notes (Deleted)
Office Visit Note  Patient: Ann Bass             Date of Birth: [birth year]           MRN: [id number]             PCP: Sheliah Hatch, MD Referring: Sheliah Hatch, MD Visit Date: 11/09/2022 Occupation: @GUAROCC @  Subjective:  No chief complaint on file.   History of Present Illness: Ann Bass is a 52 y.o. female ***     Activities of Daily Living:  Patient reports morning stiffness for *** {minute/hour:19697}.   Patient {ACTIONS;DENIES/REPORTS:21021675::"Denies"} nocturnal pain.  Difficulty dressing/grooming: {ACTIONS;DENIES/REPORTS:21021675::"Denies"} Difficulty climbing stairs: {ACTIONS;DENIES/REPORTS:21021675::"Denies"} Difficulty getting out of chair: {ACTIONS;DENIES/REPORTS:21021675::"Denies"} Difficulty using hands for taps, buttons, cutlery, and/or writing: {ACTIONS;DENIES/REPORTS:21021675::"Denies"}  No Rheumatology ROS completed.   PMFS History:  Patient Active Problem List   Diagnosis Date Noted   Hyperlipidemia 05/05/2022   Fatigue 05/05/2022   Upper airway cough syndrome 07/14/2021   Chronic rhinitis 07/14/2021   Splenic artery aneurysm (HCC) 03/04/2021   Allergic contact dermatitis 08/09/2020   Rash 08/09/2020   Steatosis of liver 01/13/2019   Renal cyst, left 01/13/2019   Overweight (BMI 25.0-29.9) 01/13/2019   OSA (obstructive sleep apnea) 09/02/2017   Ascending aorta dilatation (HCC)    SOB (shortness of breath) 02/18/2017   Excessive daytime sleepiness 02/18/2017   Vitamin D deficiency 09/17/2016   Physical exam 09/13/2014   Palpitations 09/13/2014   Decreased hearing of both ears 09/13/2014   Glucose intolerance (impaired glucose tolerance) 02/22/2013   Depression 02/22/2013   Hypothyroid 02/22/2013    Past Medical History:  Diagnosis Date   Allergy    Ascending aorta dilatation (HCC)    41mm on MRI 02/2021 and 44 mm by 2D echo 07-2021 and 44mm by Chest MRA 08/2022   Depression    post-partum   Dilated aortic root  (HCC)    41mm by chest MRI 01/2020   Dyspnea    Dysrhythmia    occational   History of gestational diabetes    Hyperlipidemia    Hypertension    Hypothyroidism    Inflammatory polyps of colon (HCC)    Liver disease    OSA (obstructive sleep apnea) 09/02/2017   Mild OSA with an AHI of 6.8/h.  On CPAP   Palpitations    PACs noted on event monitor   Pre-diabetes    updated epic hx 08-23-2019 per pt she is pre-diabetic   Splenic artery aneurysm (HCC)    12mm on MRI 02/2021    Family History  Problem Relation Age of Onset   Diabetes Mother    Cancer Father        colon   Cancer Paternal Grandmother        ovarian   Allergic rhinitis Neg Hx    Angioedema Neg Hx    Asthma Neg Hx    Eczema Neg Hx    Immunodeficiency Neg Hx    Urticaria Neg Hx    Past Surgical History:  Procedure Laterality Date   broken leg  1982   Left Femur, traction and pin in leg   COLONOSCOPY  2018   ROBOTIC ASSISTED LAPAROSCOPIC HYSTERECTOMY AND SALPINGECTOMY Bilateral 08/24/2019   Procedure: XI ROBOTIC ASSISTED LAPAROSCOPIC HYSTERECTOMY AND SALPINGECTOMY;  Surgeon: Silverio Lay, MD;  Location: [facility];  Service: Gynecology;  Laterality: Bilateral;   WISDOM TOOTH EXTRACTION  1998   Social History   Social History Narrative   Not on file  Immunization History  Administered Date(s) Administered   Influenza, Quadrivalent, Recombinant, Inj, Pf 04/21/2018   Influenza,inj,Quad PF,6+ Mos 05/25/2014, 01/13/2019, 04/17/2020, 05/06/2021, 05/05/2022   PFIZER Comirnaty(Gray Top)Covid-19 Tri-Sucrose Vaccine 12/12/2020   PFIZER(Purple Top)SARS-COV-2 Vaccination 10/24/2019, 11/14/2019   Tdap 09/13/2014   Unspecified SARS-COV-2 Vaccination 10/17/2019, 11/16/2019   Zoster Recombinat (Shingrix) 05/22/2022, 09/04/2022     Objective: Vital Signs: LMP 10/27/2017    Physical Exam   Musculoskeletal Exam: ***  CDAI Exam: CDAI Score: -- Patient Global: --; Provider Global: -- Swollen: --;  Tender: -- Joint Exam 11/09/2022   No joint exam has been documented for this visit   There is currently no information documented on the homunculus. Go to the Rheumatology activity and complete the homunculus joint exam.  Investigation: No additional findings.  Imaging: No results found.  Recent Labs: Lab Results  Component Value Date   WBC 7.8 08/18/2022   HGB 15.4 (H) 08/18/2022   PLT 311.0 08/18/2022   NA 135 08/18/2022   K 3.9 08/18/2022   CL 104 08/18/2022   CO2 26 08/18/2022   GLUCOSE 86 08/18/2022   BUN 11 08/18/2022   CREATININE 0.63 08/18/2022   BILITOT 0.5 08/18/2022   ALKPHOS 49 08/18/2022   AST 28 08/18/2022   ALT 42 (H) 08/18/2022   PROT 6.6 08/18/2022   ALBUMIN 4.4 08/18/2022   CALCIUM 9.9 08/18/2022   GFRAA 122 12/22/2019   May 05, 2022 ANA 1: 80NS, 1: 80NH, cytoplasmic, TSH normal, vitamin D76.64, ESR 4  Speciality Comments: No specialty comments available.  Procedures:  No procedures performed Allergies: Penicillins   Assessment / Plan:     Visit Diagnoses: Positive ANA (antinuclear antibody)  Other fatigue  History of femur fracture  Hallux valgus (acquired), right foot - Followed by Dr. Lilian Kapur.  Urticaria - Followed by Dr. Dellis Anes.  Urticaria responded to Xolair.  Seasonal allergies - To trees and outdoor molds.  Followed by Dr. Dellis Anes.  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 Mayford Knife  Mixed hyperlipidemia  Palpitations - PACs followed by cardiology  Steatosis of liver  Vitamin D deficiency  Renal cyst, left  OSA (obstructive sleep apnea) - She is on PAP .  Other depression  Decreased hearing of both ears  Orders: No orders of the defined types were placed in this encounter.  No orders of the defined types were placed in this  encounter.   Face-to-face time spent with patient was *** minutes. Greater than 50% of time was spent in counseling and coordination of care.  Follow-Up Instructions: No follow-ups on file.   Pollyann Savoy, MD  Note - This record has been created using Animal nutritionist.  Chart creation errors have been sought, but may not always  have been located. Such creation errors do not reflect on  the standard of medical care.

## 2022-10-29 ENCOUNTER — Ambulatory Visit (INDEPENDENT_AMBULATORY_CARE_PROVIDER_SITE_OTHER): Payer: 59

## 2022-10-29 ENCOUNTER — Ambulatory Visit (INDEPENDENT_AMBULATORY_CARE_PROVIDER_SITE_OTHER): Payer: 59 | Admitting: Podiatry

## 2022-10-29 DIAGNOSIS — M2011 Hallux valgus (acquired), right foot: Secondary | ICD-10-CM

## 2022-10-29 DIAGNOSIS — Z9889 Other specified postprocedural states: Secondary | ICD-10-CM | POA: Diagnosis not present

## 2022-10-29 DIAGNOSIS — M21961 Unspecified acquired deformity of right lower leg: Secondary | ICD-10-CM

## 2022-10-29 DIAGNOSIS — M21611 Bunion of right foot: Secondary | ICD-10-CM

## 2022-10-29 DIAGNOSIS — M357 Hypermobility syndrome: Secondary | ICD-10-CM

## 2022-10-30 NOTE — Progress Notes (Signed)
  Subjective:  Patient ID: Ann Bass, female    DOB: December 02, 1970,  MRN: [id number]  Chief Complaint  Patient presents with   Routine Post Op    POV # 1 DOS 10/23/22 --- BUNION CORRECTION RIGHT FOOT WITH LAPIDUS PROCEDURE, POSSIBLE BONE CUT IN BIG TOE, BONE GRAFT FROM HEEL, SHORTENING 2ND METATARSAL      52 y.o. female returns for post-op check.  Overall she is doing okay, having minimal pain.  Review of Systems: Negative except as noted in the HPI. Denies N/V/F/Ch.   Objective:  There were no vitals filed for this visit. There is no height or weight on file to calculate BMI. Constitutional Well developed. Well nourished.  Vascular Foot warm and well perfused. Capillary refill normal to all digits.  Calf is soft and supple, no posterior calf or knee pain, negative Homans' sign  Neurologic Normal speech. Oriented to person, place, and time. Epicritic sensation to light touch grossly present bilaterally.  Dermatologic Skin healing well without signs of infection. Skin edges well coapted without signs of infection.  Orthopedic: Tenderness to palpation noted about the surgical site.   Multiple view plain film radiographs: Good correction noted no complication of hardware Assessment:   1. Hallux valgus with bunions, right   2. Deformity of metatarsal bone of right foot   3. Hypermobile joint syndrome of foot    Plan:  Patient was evaluated and treated and all questions answered.  S/p foot surgery right -Progressing as expected post-operatively. -XR: Noted above no complication of hardware -WB Status: NWB in CAM boot -Sutures: Reevaluate in 2 weeks. -Medications: No refills required -Foot redressed.  May begin showering after next visit  Return in about 2 weeks (around 11/12/2022) for post op (no x-rays).

## 2022-11-01 ENCOUNTER — Encounter: Payer: Self-pay | Admitting: Podiatry

## 2022-11-02 ENCOUNTER — Other Ambulatory Visit: Payer: Self-pay | Admitting: Podiatry

## 2022-11-02 MED ORDER — DOXYCYCLINE HYCLATE 100 MG PO TABS: 100.0000 mg | ORAL_TABLET | Freq: Two times a day (BID) | ORAL | 0 refills | Status: DC

## 2022-11-04 ENCOUNTER — Ambulatory Visit (AMBULATORY_SURGERY_CENTER): Payer: 59

## 2022-11-04 ENCOUNTER — Encounter: Payer: Self-pay | Admitting: Internal Medicine

## 2022-11-04 VITALS — Ht 62.0 in | Wt 152.0 lb

## 2022-11-04 DIAGNOSIS — Z800 Family history of malignant neoplasm of digestive organs: Secondary | ICD-10-CM

## 2022-11-04 NOTE — Progress Notes (Signed)
No egg or soy allergy known to patient  No issues known to pt with past sedation with any surgeries or procedures Patient denies ever being told they had issues or difficulty with intubation  No FH of Malignant Hyperthermia Pt is not on diet pills Pt is not on  home 02  Pt is not on blood thinners  Pt denies issues with constipation  No A fib or A flutter Have any cardiac testing pending-- 1 year follow up has aortic and splenic aneurysm - MRI/CT scan  LOA: independent  Prep: spilt dose miralax per pt request (used for her past colonoscopies)   Patient's chart reviewed by Ann Bass CNRA prior to previsit and patient appropriate for the [facility].  Previsit completed and red dot placed by patient's name on their procedure day (on provider's schedule).     PV completed. Prep instructions sent via mychart and to home address. Pt instructed to read all the areas that are hig lighted in the prep instructions and take note of the days she will need to start restricting her diet. Pt to call the office back if she has any further questions or concerns

## 2022-11-09 ENCOUNTER — Telehealth: Payer: Self-pay | Admitting: *Deleted

## 2022-11-09 ENCOUNTER — Other Ambulatory Visit: Payer: Self-pay | Admitting: Podiatry

## 2022-11-09 ENCOUNTER — Encounter: Payer: 59 | Admitting: Rheumatology

## 2022-11-09 DIAGNOSIS — Z8781 Personal history of (healed) traumatic fracture: Secondary | ICD-10-CM

## 2022-11-09 DIAGNOSIS — K760 Fatty (change of) liver, not elsewhere classified: Secondary | ICD-10-CM

## 2022-11-09 DIAGNOSIS — I10 Essential (primary) hypertension: Secondary | ICD-10-CM

## 2022-11-09 DIAGNOSIS — M2011 Hallux valgus (acquired), right foot: Secondary | ICD-10-CM

## 2022-11-09 DIAGNOSIS — G4733 Obstructive sleep apnea (adult) (pediatric): Secondary | ICD-10-CM

## 2022-11-09 DIAGNOSIS — I7121 Aneurysm of the ascending aorta, without rupture: Secondary | ICD-10-CM

## 2022-11-09 DIAGNOSIS — R5383 Other fatigue: Secondary | ICD-10-CM

## 2022-11-09 DIAGNOSIS — N281 Cyst of kidney, acquired: Secondary | ICD-10-CM

## 2022-11-09 DIAGNOSIS — F3289 Other specified depressive episodes: Secondary | ICD-10-CM

## 2022-11-09 DIAGNOSIS — L2389 Allergic contact dermatitis due to other agents: Secondary | ICD-10-CM

## 2022-11-09 DIAGNOSIS — R768 Other specified abnormal immunological findings in serum: Secondary | ICD-10-CM

## 2022-11-09 DIAGNOSIS — E559 Vitamin D deficiency, unspecified: Secondary | ICD-10-CM

## 2022-11-09 DIAGNOSIS — I728 Aneurysm of other specified arteries: Secondary | ICD-10-CM

## 2022-11-09 DIAGNOSIS — L509 Urticaria, unspecified: Secondary | ICD-10-CM

## 2022-11-09 DIAGNOSIS — E782 Mixed hyperlipidemia: Secondary | ICD-10-CM

## 2022-11-09 DIAGNOSIS — H9193 Unspecified hearing loss, bilateral: Secondary | ICD-10-CM

## 2022-11-09 DIAGNOSIS — J302 Other seasonal allergic rhinitis: Secondary | ICD-10-CM

## 2022-11-09 DIAGNOSIS — R002 Palpitations: Secondary | ICD-10-CM

## 2022-11-09 MED ORDER — SULFAMETHOXAZOLE-TRIMETHOPRIM 800-160 MG PO TABS
1.0000 | ORAL_TABLET | Freq: Two times a day (BID) | ORAL | 0 refills | Status: DC
Start: 2022-11-09 — End: 2022-11-23

## 2022-11-09 NOTE — Telephone Encounter (Signed)
Patient has started on the doxycycline and has developed an itchy rash on thighs and arms, has 2 more days remaining, should she not take those and should something else be prescribed or wait until she is seen on Wed.?

## 2022-11-11 ENCOUNTER — Encounter: Payer: Self-pay | Admitting: Podiatry

## 2022-11-11 ENCOUNTER — Ambulatory Visit (INDEPENDENT_AMBULATORY_CARE_PROVIDER_SITE_OTHER): Payer: 59 | Admitting: Podiatry

## 2022-11-11 DIAGNOSIS — M2011 Hallux valgus (acquired), right foot: Secondary | ICD-10-CM

## 2022-11-11 DIAGNOSIS — M21611 Bunion of right foot: Secondary | ICD-10-CM

## 2022-11-11 DIAGNOSIS — M21961 Unspecified acquired deformity of right lower leg: Secondary | ICD-10-CM

## 2022-11-11 DIAGNOSIS — Z9889 Other specified postprocedural states: Secondary | ICD-10-CM

## 2022-11-11 NOTE — Progress Notes (Signed)
  Subjective:  Patient ID: Ann Bass, female    DOB: Jan 02, 1971,  MRN: [id number]  Chief Complaint  Patient presents with   Routine Post Op    POV # 2 DOS 10/23/22 --- BUNION CORRECTION RIGHT FOOT WITH LAPIDUS PROCEDURE, POSSIBLE BONE CUT IN BIG TOE, BONE GRAFT FROM HEEL, SHORTENING 2ND METATARSAL      52 y.o. female returns for post-op check.  Overall she is doing okay, having minimal pain.  Review of Systems: Negative except as noted in the HPI. Denies N/V/F/Ch.   Objective:  There were no vitals filed for this visit. There is no height or weight on file to calculate BMI. Constitutional Well developed. Well nourished.  Vascular Foot warm and well perfused. Capillary refill normal to all digits.  Calf is soft and supple, no posterior calf or knee pain, negative Homans' sign  Neurologic Normal speech. Oriented to person, place, and time. Epicritic sensation to light touch grossly present bilaterally.  Dermatologic Skin healing well without signs of infection. Skin edges well coapted without signs of infection.  Orthopedic: Tenderness to palpation noted about the surgical site.   Multiple view plain film radiographs: Good correction noted no complication of hardware Assessment:   1. Hallux valgus with bunions, right   2. Deformity of metatarsal bone of right foot   3. Status post foot surgery    Plan:  Patient was evaluated and treated and all questions answered.  S/p foot surgery right -Progressing as expected post-operatively. -XR: Noted above no complication of hardware -WB Status: NWB in CAM boot -Sutures: Removed.  No signs of Deis are noted.  No complication noted. -Medications: No refills required -Betadine wet-to-dry dressing slight maceration noted.  No follow-ups on file.

## 2022-11-11 NOTE — Patient Instructions (Signed)
Do Betadine wet-to-dry dressing every day or every other day.  Put some iodine on a gauze over the wound and then wrapped it up with Kerlix or Coban or Ace bandage.  You will start to put weight down now to the heel and then in 2 weeks put weight down fully in a boot.  He will hold off on driving for now.

## 2022-11-17 ENCOUNTER — Telehealth: Payer: Self-pay | Admitting: Surgery

## 2022-11-17 NOTE — Telephone Encounter (Signed)
Pt was contacted for CT scan appt but declined stated that she wanted to see MD first, called pt to get better understanding.

## 2022-11-23 ENCOUNTER — Ambulatory Visit: Payer: 59 | Attending: Cardiology | Admitting: Cardiology

## 2022-11-23 ENCOUNTER — Encounter: Payer: Self-pay | Admitting: Cardiology

## 2022-11-23 VITALS — BP 138/90 | HR 71 | Ht 62.0 in | Wt 156.6 lb

## 2022-11-23 DIAGNOSIS — I728 Aneurysm of other specified arteries: Secondary | ICD-10-CM

## 2022-11-23 DIAGNOSIS — E7800 Pure hypercholesterolemia, unspecified: Secondary | ICD-10-CM

## 2022-11-23 DIAGNOSIS — I491 Atrial premature depolarization: Secondary | ICD-10-CM

## 2022-11-23 DIAGNOSIS — G4733 Obstructive sleep apnea (adult) (pediatric): Secondary | ICD-10-CM | POA: Diagnosis not present

## 2022-11-23 DIAGNOSIS — I77810 Thoracic aortic ectasia: Secondary | ICD-10-CM

## 2022-11-23 DIAGNOSIS — I10 Essential (primary) hypertension: Secondary | ICD-10-CM

## 2022-11-23 DIAGNOSIS — R0609 Other forms of dyspnea: Secondary | ICD-10-CM

## 2022-11-23 DIAGNOSIS — I7123 Aneurysm of the descending thoracic aorta, without rupture: Secondary | ICD-10-CM | POA: Diagnosis not present

## 2022-11-23 MED ORDER — METOPROLOL SUCCINATE ER 25 MG PO TB24: 25.0000 mg | ORAL_TABLET | Freq: Every day | ORAL | 3 refills | Status: DC

## 2022-11-23 NOTE — Addendum Note (Signed)
Addended by: Frutoso Schatz on: 11/23/2022 12:08 PM   Modules accepted: Orders

## 2022-11-23 NOTE — Progress Notes (Signed)
Date:  02/07/2020   ID:  Ann Bass, DOB December 22, 1970, MRN [id number]  Date:  11/23/2022   ID:  Ann Bass, DOB 1970/08/27, MRN [id number]  PCP:  Sheliah Hatch, MD  Cardiologist:  Armanda Magic, MD    Referring MD: Sheliah Hatch, MD   Chief Complaint  Patient presents with   Follow-up    Obstructive sleep apnea, hypertension, dilated aorta, PACs,  hyperlipidemia    History of Present Illness:    Ann Bass is a 52 y.o. female with a hx of palpitations and shortness of breath.  2D echocardiogram showed normal LV function with mildly dilated a sending aorta at 39 mmHg otherwise normal .  Exercise stress test noted showed no inducible ischemia.  Event monitor was normal except for a rare PAC.  She also has mild obstructive sleep apnea with an AHI of 6.8/hr.  Lowest oxygen saturation drop was 82%.  She is on CPAP.   She is here today for followup and is doing well.  She has chronic DOE related to allergies and heat and is stable with no change from a year ago.  She denies any chest pain or pressure, PND, orthopnea, LE edema or syncope. She still has random episodes of very brief racing and is stable and unchanged from a year ago. She is compliant with her meds and is tolerating meds with no SE.    She is doing well with her PAP device and thinks that she has gotten used to it.  She tolerates the mask and feels the pressure is adequate.  Since going on PAP she feels rested in the am and has no significant daytime sleepiness.  She denies any significant mouth or nasal dryness or nasal congestion.  She does not think that he snores.    Past Medical History:  Diagnosis Date   Allergy    Ascending aorta dilatation (HCC)    41mm on MRI 02/2021 and 44 mm by 2D echo 07-2021 and 44mm by Chest MRA 08/2022   Depression    post-partum   Dilated aortic root (HCC)    41mm by chest MRI 01/2020   Dyspnea    Dysrhythmia    occational   History of gestational diabetes     Hyperlipidemia    Hypertension    Hypothyroidism    Inflammatory polyps of colon (HCC)    Liver disease    OSA (obstructive sleep apnea) 09/02/2017   Mild OSA with an AHI of 6.8/h.  On CPAP   Palpitations    PACs noted on event monitor   Pre-diabetes    updated epic hx 08-23-2019 per pt she is pre-diabetic   Splenic artery aneurysm (HCC)    12mm on MRI 02/2021    Past Surgical History:  Procedure Laterality Date   broken leg  1982   Left Femur, traction and pin in leg   COLONOSCOPY  2018   ROBOTIC ASSISTED LAPAROSCOPIC HYSTERECTOMY AND SALPINGECTOMY Bilateral 08/24/2019   Procedure: XI ROBOTIC ASSISTED LAPAROSCOPIC HYSTERECTOMY AND SALPINGECTOMY;  Surgeon: Silverio Lay, MD;  Location: [facility];  Service: Gynecology;  Laterality: Bilateral;   WISDOM TOOTH EXTRACTION  1998    Current Medications: Current Meds  Medication Sig   Ascorbic Acid (VITAMIN C) 1000 MG tablet Take 1,000 mg by mouth daily.   buPROPion (WELLBUTRIN XL) 300 MG 24 hr tablet TAKE 1 TABLET BY MOUTH EVERY DAY   cholecalciferol (VITAMIN D3) 25 MCG (1000 UNIT) tablet Take  1,000 Units by mouth daily.   citalopram (CELEXA) 20 MG tablet TAKE 1 TABLET BY MOUTH EVERY DAY   EPINEPHrine 0.3 mg/0.3 mL IJ SOAJ injection INJECT 0.3 MG INTO THE MUSCLE ONCE FOR 1 DOSE.   fexofenadine (ALLEGRA) 180 MG tablet Take 180 mg by mouth daily.   levocetirizine (XYZAL) 5 MG tablet Take a tablet 1-2 times daily during the spring/summer to keep the hives at bay in combination with your Allegra daily.   Magnesium Oxide 400 MG CAPS Take 400 mg by mouth at bedtime.   Multiple Vitamin (MULTIVITAMIN) tablet Take 1 tablet by mouth daily.   omalizumab (XOLAIR) 150 MG/ML prefilled syringe INJECT 2 SYRINGES UNDER THE SKIN EVERY 4 WEEKS   Omega-3 Fatty Acids (FISH OIL) 1000 MG CAPS Take 1,000 mg by mouth daily.    rosuvastatin (CRESTOR) 40 MG tablet TAKE 1 TABLET BY MOUTH EVERY DAY   triamcinolone ointment (KENALOG) 0.1 % Apply 1  application topically 2 (two) times daily.   Current Facility-Administered Medications for the 11/23/22 encounter (Office Visit) with Quintella Reichert, MD  Medication   omalizumab Geoffry Paradise) injection 300 mg     Allergies:   Penicillins and Doxycycline   Social History   Socioeconomic History   Marital status: Married    Spouse name: Not on file   Number of children: Not on file   Years of education: Not on file   Highest education level: Bachelor's degree (e.g., BA, AB, BS)  Occupational History   Not on file  Tobacco Use   Smoking status: Never   Smokeless tobacco: Never  Vaping Use   Vaping Use: Never used  Substance and Sexual Activity   Alcohol use: Yes    Comment: rare   Drug use: No   Sexual activity: Yes  Other Topics Concern   Not on file  Social History Narrative   Not on file   Social Determinants of Health   Financial Resource Strain: Low Risk  (08/18/2022)   Overall Financial Resource Strain (CARDIA)    Difficulty of Paying Living Expenses: Not hard at all  Food Insecurity: No Food Insecurity (08/18/2022)   Hunger Vital Sign    Worried About Running Out of Food in the Last Year: Never true    Ran Out of Food in the Last Year: Never true  Transportation Needs: No Transportation Needs (08/18/2022)   PRAPARE - Administrator, Civil Service (Medical): No    Lack of Transportation (Non-Medical): No  Physical Activity: Insufficiently Active (08/18/2022)   Exercise Vital Sign    Days of Exercise per Week: 3 days    Minutes of Exercise per Session: 30 min  Stress: No Stress Concern Present (08/18/2022)   [employer] of Occupational Health - Occupational Stress Questionnaire    Feeling of Stress : Not at all  Social Connections: Unknown (08/18/2022)   Social Connection and Isolation Panel [NHANES]    Frequency of Communication with Friends and Family: Patient declined    Frequency of Social Gatherings with Friends and Family: Patient declined    Attends  Religious Services: More than 4 times per year    Active Member of [employer] or Organizations: Yes    Attends Engineer, structural: More than 4 times per year    Marital Status: Married     Family History: The patient's family history includes Cancer in her father and paternal grandmother; Colon cancer (age of onset: 82 16 34) in her father; Diabetes in her mother.  There is no history of Allergic rhinitis, Angioedema, Asthma, Eczema, Immunodeficiency, Urticaria, Rectal cancer, Stomach cancer, or Esophageal cancer.  ROS:   Please see the history of present illness.    ROS  All other systems reviewed and negative.   EKGs/Labs/Other Studies Reviewed:    The following studies were reviewed today: PAP compliance download from [facility]   EKG Interpretation Date/Time:  Monday November 23 2022 11:47:21 EDT Ventricular Rate:  68 PR Interval:  174 QRS Duration:  78 QT Interval:  440 QTC Calculation: 467 R Axis:   35  Text Interpretation: Normal sinus rhythm Normal ECG When compared with ECG of 15-Feb-2017 17:28, PREVIOUS ECG IS PRESENT Confirmed by Armanda Magic [phone]) on 11/23/2022 11:50:41 AM    Recent Labs: 08/18/2022: ALT 42; BUN 11; Creatinine, Ser 0.63; Hemoglobin 15.4; Platelets 311.0; Potassium 3.9; Sodium 135; TSH 2.60   Recent Lipid Panel    Component Value Date/Time   CHOL 126 05/05/2022 1015   CHOL 109 10/28/2020 1035   TRIG 115.0 05/05/2022 1015   HDL 50.30 05/05/2022 1015   HDL 38 (L) 10/28/2020 1035   CHOLHDL 3 05/05/2022 1015   VLDL 23.0 05/05/2022 1015   LDLCALC 53 05/05/2022 1015   LDLCALC 53 10/28/2020 1035   LDLDIRECT 155.0 01/13/2019 1208    Physical Exam:    VS:  BP (!) 138/90   Pulse 71   Ht 5\' 2"  (1.575 m)   Wt 156 lb 9.6 oz (71 kg)   LMP 10/27/2017   SpO2 97%   BMI 28.64 kg/m     Wt Readings from Last 3 Encounters:  11/23/22 156 lb 9.6 oz (71 kg)  11/04/22 152 lb (68.9 kg)  08/18/22 153 lb (69.4 kg)  GEN: Well nourished, well developed in no  acute distress HEENT: Normal NECK: No JVD; No carotid bruits LYMPHATICS: No lymphadenopathy CARDIAC:RRR, no murmurs, rubs, gallops RESPIRATORY:  Clear to auscultation without rales, wheezing or rhonchi  ABDOMEN: Soft, non-tender, non-distended MUSCULOSKELETAL:  No edema; right LE boot from bunion surgery SKIN: Warm and dry NEUROLOGIC:  Alert and oriented x 3 PSYCHIATRIC:  Normal affect  ASSESSMENT:    1. OSA (obstructive sleep apnea)   2. Ascending aortic aneurysm (HCC)   3. PAC (premature atrial contraction)    PLAN:    In order of problems listed above:  1.  OSA - The patient is tolerating PAP therapy well without any problems. The PAP download performed by his DME was personally reviewed and interpreted by me today and showed an AHI of 1.3 /hr on auto CPAP from 4-18 cm H2O with 77 % compliance in using more than 4 hours nightly.  The patient has been using and benefiting from PAP use and will continue to benefit from therapy.    2.  Dilated ascending aorta -echo and Chest CTA 12/2018 showed ascending aorta dimension around 42mm. -cardiac MRI 01/2021 showed stable ascending aortic aneurysm at 4.1cm -2D echo 08/04/2021 and cardiac MRI 08/2022 with ascending aorta 4.4 cm -repeat cMRI in 08/2023 -Blood pressure elevated with goal BP < 130/52mmHg  3.  PACs -fairly stable with her palpitations -adding Toprol   4.  HLD -LDL goal < 70  -I have personally reviewed and interpreted outside labs performed by patient's PCP which showed LDL 53 and HDL 50 on 05/05/2022 -Continue prescription drug agement with rosuvastatin 40 mg daily with as needed refills  5.  Splenic artery aneurysm -this is small on MRI and unchanged from 2019 per radiology -followed by [facility]  6.  DOE -ETT was normal in 2018 and 2021 -Coronary calcium score 04/06/2020 was 0 - 2D echo 08/04/2021 showed EF 60 to 65% with normal diastolic function -noncardiac and likely related to allergies  7.  HTN -BP elevated with  goal < 130/59mmHg due to aortic dilatation -will add Toprol XL 25mg  daily which will also help with palpitations  Followup with me in 1 year    Medication Adjustments/Labs and Tests Ordered: Current medicines are reviewed at length with the patient today.  Concerns regarding medicines are outlined above.  Orders Placed This Encounter  Procedures   EKG 12-Lead   No orders of the defined types were placed in this encounter.   Signed, Armanda Magic, MD  11/23/2022 12:03 PM    [facility]

## 2022-11-23 NOTE — Patient Instructions (Signed)
Medication Instructions:  Your physician has recommended you make the following change in your medication:  1) START taking Toprol XL (metoprolol succinate) 25 mg daily  *If you need a refill on your cardiac medications before your next appointment, please call your pharmacy*  Check your blood pressure and heart rate twice daily for one week and send Dr. Mayford Knife a MyChart message with a list of those readings.   Follow-Up: At [facility], you and your health needs are our priority.  As part of our continuing mission to provide you with exceptional heart care, we have created designated Provider Care Teams.  These Care Teams include your primary Cardiologist (physician) and Advanced Practice Providers (APPs -  Physician Assistants and Nurse Practitioners) who all work together to provide you with the care you need, when you need it.  Your next appointment:   1 year  Provider:   Armanda Magic, MD

## 2022-11-24 ENCOUNTER — Ambulatory Visit (AMBULATORY_SURGERY_CENTER): Payer: 59 | Admitting: Internal Medicine

## 2022-11-24 ENCOUNTER — Encounter: Payer: Self-pay | Admitting: Internal Medicine

## 2022-11-24 VITALS — BP 135/75 | HR 65 | Temp 98.4°F | Resp 10 | Ht 62.0 in | Wt 152.0 lb

## 2022-11-24 DIAGNOSIS — Z1211 Encounter for screening for malignant neoplasm of colon: Secondary | ICD-10-CM

## 2022-11-24 DIAGNOSIS — K633 Ulcer of intestine: Secondary | ICD-10-CM | POA: Diagnosis not present

## 2022-11-24 DIAGNOSIS — Z800 Family history of malignant neoplasm of digestive organs: Secondary | ICD-10-CM | POA: Diagnosis not present

## 2022-11-24 DIAGNOSIS — D125 Benign neoplasm of sigmoid colon: Secondary | ICD-10-CM

## 2022-11-24 DIAGNOSIS — K6289 Other specified diseases of anus and rectum: Secondary | ICD-10-CM

## 2022-11-24 HISTORY — PX: COLONOSCOPY: SHX174

## 2022-11-24 MED ORDER — SODIUM CHLORIDE 0.9 % IV SOLN
500.0000 mL | Freq: Once | INTRAVENOUS | Status: DC
Start: 2022-11-24 — End: 2022-11-24

## 2022-11-24 NOTE — Patient Instructions (Signed)
Resume all of your previous medications today.  Read your discharge instructions.  YOU HAD AN ENDOSCOPIC PROCEDURE TODAY AT THE [facility]:   Refer to the procedure report that was given to you for any specific questions about what was found during the examination.  If the procedure report does not answer your questions, please call your gastroenterologist to clarify.  If you requested that your care partner not be given the details of your procedure findings, then the procedure report has been included in a sealed envelope for you to review at your convenience later.  YOU SHOULD EXPECT: Some feelings of bloating in the abdomen. Passage of more gas than usual.  Walking can help get rid of the air that was put into your GI tract during the procedure and reduce the bloating. If you had a lower endoscopy (such as a colonoscopy or flexible sigmoidoscopy) you may notice spotting of blood in your stool or on the toilet paper. If you underwent a bowel prep for your procedure, you may not have a normal bowel movement for a few days.  Please Note:  You might notice some irritation and congestion in your nose or some drainage.  This is from the oxygen used during your procedure.  There is no need for concern and it should clear up in a day or so.  SYMPTOMS TO REPORT IMMEDIATELY:  Following lower endoscopy (colonoscopy or flexible sigmoidoscopy):  Excessive amounts of blood in the stool  Significant tenderness or worsening of abdominal pains  Swelling of the abdomen that is new, acute  Fever of 100F or higher   For urgent or emergent issues, a gastroenterologist can be reached at any hour by calling [phone]. Do not use MyChart messaging for urgent concerns.    DIET:  We do recommend a small meal at first, but then you may proceed to your regular diet.  Drink plenty of fluids but you should avoid alcoholic beverages for 24 hours.  ACTIVITY:  You should plan to take it easy for the  rest of today and you should NOT DRIVE or use heavy machinery until tomorrow (because of the sedation medicines used during the test).    FOLLOW UP: Our staff will call the number listed on your records the next business day following your procedure.  We will call around 7:15- 8:00 am to check on you and address any questions or concerns that you may have regarding the information given to you following your procedure. If we do not reach you, we will leave a message.     If any biopsies were taken you will be contacted by phone or by letter within the next 1-3 weeks.  Please call us at [phone] if you have not heard about the biopsies in 3 weeks.    SIGNATURES/CONFIDENTIALITY: You and/or your care partner have signed paperwork which will be entered into your electronic medical record.  These signatures attest to the fact that that the information above on your After Visit Summary has been reviewed and is understood.  Full responsibility of the confidentiality of this discharge information lies with you and/or your care-partner.

## 2022-11-24 NOTE — Progress Notes (Signed)
Cell phone off per pt  Pt's states no medical or surgical changes since previsit or office visit.   750 BP rechecked 140/100-  CRNA aware

## 2022-11-24 NOTE — Op Note (Signed)
[facility] Patient Name: Ann Bass Procedure Date: 11/24/2022 7:42 AM MRN: [id number] Endoscopist: Particia Lather , , [phone] Age: 52 Referring MD:  Date of Birth: [birth year] Gender: Female Account #: [id number] Procedure:                Colonoscopy Indications:              Screening in patient at increased risk: Family                            history of 1st-degree relative with colorectal                            cancer before age 57 years Medicines:                Monitored Anesthesia Care Procedure:                Pre-Anesthesia Assessment:                           - Prior to the procedure, a History and Physical                            was performed, and patient medications and                            allergies were reviewed. The patient's tolerance of                            previous anesthesia was also reviewed. The risks                            and benefits of the procedure and the sedation                            options and risks were discussed with the patient.                            All questions were answered, and informed consent                            was obtained. Prior Anticoagulants: The patient has                            taken no anticoagulant or antiplatelet agents. ASA                            Grade Assessment: II - A patient with mild systemic                            disease. After reviewing the risks and benefits,                            the patient was deemed in satisfactory condition to  undergo the procedure.                           After obtaining informed consent, the colonoscope                            was passed under direct vision. Throughout the                            procedure, the patient's blood pressure, pulse, and                            oxygen saturations were monitored continuously. The                            CF [id number] #[id number] was introduced  through the anus                            and advanced to the the terminal ileum. The                            colonoscopy was performed without difficulty. The                            patient tolerated the procedure well. The quality                            of the bowel preparation was excellent. The                            terminal ileum, ileocecal valve, appendiceal                            orifice, and rectum were photographed. Scope In: 8:18:05 AM Scope Out: 8:43:32 AM Scope Withdrawal Time: 0 hours 16 minutes 57 seconds  Total Procedure Duration: 0 hours 25 minutes 27 seconds  Findings:                 The terminal ileum appeared normal.                           Multiple scattered non-bleeding erosions were found                            in the rectum, in the sigmoid colon and in the                            descending colon. No stigmata of recent bleeding                            were seen. Biopsies were taken with a cold forceps                            for histology.  A 3 mm polyp was found in the sigmoid colon. The                            polyp was sessile. The polyp was removed with a                            cold snare. Resection and retrieval were complete.                           Non-bleeding internal hemorrhoids were found during                            retroflexion. Complications:            No immediate complications. Estimated Blood Loss:     Estimated blood loss was minimal. Impression:               - The examined portion of the ileum was normal.                           - Multiple erosions in the rectum, in the sigmoid                            colon and in the descending colon. Biopsied.                           - One 3 mm polyp in the sigmoid colon, removed with                            a cold snare. Resected and retrieved.                           - Non-bleeding internal hemorrhoids. Recommendation:            - Discharge patient to home (with escort).                           - Await pathology results.                           - The findings and recommendations were discussed                            with the patient. Dr Particia Lather "Alan Ripper" Leonides Schanz,  11/24/2022 8:47:28 AM

## 2022-11-24 NOTE — Progress Notes (Signed)
Sedate, gd SR, tolerated procedure well, VSS, report to RN 

## 2022-11-24 NOTE — Progress Notes (Signed)
Called to room to assist during endoscopic procedure.  Patient ID and intended procedure confirmed with present staff. Received instructions for my participation in the procedure from the performing physician.  

## 2022-11-24 NOTE — Progress Notes (Signed)
GASTROENTEROLOGY PROCEDURE H&P NOTE   Primary Care Physician: Sheliah Hatch, MD    Reason for Procedure:   Colon cancer screening  Plan:    Colonoscopy  Patient is appropriate for endoscopic procedure(s) in the ambulatory ([facility]) setting.  The nature of the procedure, as well as the risks, benefits, and alternatives were carefully and thoroughly reviewed with the patient. Ample time for discussion and questions allowed. The patient understood, was satisfied, and agreed to proceed.     HPI: Ann Bass is a 52 y.o. female who presents for colonoscopy for colon cancer screening. Denies blood in stools, changes in bowel habits, or unintentional weight loss. Father had colon cancer at age 59. Last colonoscopy was in 2017 without any polyps. She has had 3 colonoscopies in the past. She had colon polyps on her first colonoscopy.   Past Medical History:  Diagnosis Date   Allergy    Ascending aorta dilatation (HCC)    41mm on MRI 02/2021 and 44 mm by 2D echo 07-2021 and 44mm by Chest MRA 08/2022   Depression    post-partum   Dilated aortic root (HCC)    41mm by chest MRI 01/2020   Dyspnea    Dysrhythmia    occational   History of gestational diabetes    Hyperlipidemia    Hypertension    Hypothyroidism    Inflammatory polyps of colon (HCC)    Liver disease    OSA (obstructive sleep apnea) 09/02/2017   Mild OSA with an AHI of 6.8/h.  On CPAP   Palpitations    PACs noted on event monitor   Pre-diabetes    updated epic hx 08-23-2019 per pt she is pre-diabetic   Splenic artery aneurysm (HCC)    12mm on MRI 02/2021    Past Surgical History:  Procedure Laterality Date   broken leg  1982   Left Femur, traction and pin in leg   COLONOSCOPY  2018   FOOT SURGERY Right    ROBOTIC ASSISTED LAPAROSCOPIC HYSTERECTOMY AND SALPINGECTOMY Bilateral 08/24/2019   Procedure: XI ROBOTIC ASSISTED LAPAROSCOPIC HYSTERECTOMY AND SALPINGECTOMY;  Surgeon: Silverio Lay, MD;  Location:  [facility] [place];  Service: Gynecology;  Laterality: Bilateral;   WISDOM TOOTH EXTRACTION  1998    Prior to Admission medications   Medication Sig Start Date End Date Taking? Authorizing Provider  Ascorbic Acid (VITAMIN C) 1000 MG tablet Take 1,000 mg by mouth daily.   Yes [provider]  buPROPion (WELLBUTRIN XL) 300 MG 24 hr tablet TAKE 1 TABLET BY MOUTH EVERY DAY 05/04/22  Yes Sheliah Hatch, MD  cholecalciferol (VITAMIN D3) 25 MCG (1000 UNIT) tablet Take 1,000 Units by mouth daily.   Yes [provider]  citalopram (CELEXA) 20 MG tablet TAKE 1 TABLET BY MOUTH EVERY DAY 08/03/22  Yes Sheliah Hatch, MD  fexofenadine (ALLEGRA) 180 MG tablet Take 180 mg by mouth daily.   Yes [provider]  levocetirizine (XYZAL) 5 MG tablet Take a tablet 1-2 times daily during the spring/summer to keep the hives at bay in combination with your Allegra daily. 11/05/20  Yes Alfonse Spruce, MD  Magnesium Oxide 400 MG CAPS Take 400 mg by mouth at bedtime. 05/19/19  Yes [provider]  Multiple Vitamin (MULTIVITAMIN) tablet Take 1 tablet by mouth daily.   Yes [provider]  Omega-3 Fatty Acids (FISH OIL) 1000 MG CAPS Take 1,000 mg by mouth daily.    Yes [provider]  rosuvastatin (  CRESTOR) 40 MG tablet TAKE 1 TABLET BY MOUTH EVERY DAY 09/03/22  Yes Turner, Cornelious Bryant, MD  triamcinolone ointment (KENALOG) 0.1 % Apply 1 application topically 2 (two) times daily. 06/14/20  Yes Alfonse Spruce, MD  EPINEPHrine 0.3 mg/0.3 mL IJ SOAJ injection INJECT 0.3 MG INTO THE MUSCLE ONCE FOR 1 DOSE. Patient not taking: Reported on 11/24/2022 08/20/20   [provider]  metoprolol succinate (TOPROL XL) 25 MG 24 hr tablet Take 1 tablet (25 mg total) by mouth daily. Patient not taking: Reported on 11/24/2022 11/23/22   Quintella Reichert, MD  omalizumab Geoffry Paradise) 150 MG/ML prefilled syringe INJECT 2 SYRINGES UNDER THE SKIN EVERY 4 WEEKS 06/23/22    Alfonse Spruce, MD    Current Outpatient Medications  Medication Sig Dispense Refill   Ascorbic Acid (VITAMIN C) 1000 MG tablet Take 1,000 mg by mouth daily.     buPROPion (WELLBUTRIN XL) 300 MG 24 hr tablet TAKE 1 TABLET BY MOUTH EVERY DAY 90 tablet 2   cholecalciferol (VITAMIN D3) 25 MCG (1000 UNIT) tablet Take 1,000 Units by mouth daily.     citalopram (CELEXA) 20 MG tablet TAKE 1 TABLET BY MOUTH EVERY DAY 90 tablet 1   fexofenadine (ALLEGRA) 180 MG tablet Take 180 mg by mouth daily.     levocetirizine (XYZAL) 5 MG tablet Take a tablet 1-2 times daily during the spring/summer to keep the hives at bay in combination with your Allegra daily. 60 tablet 3   Magnesium Oxide 400 MG CAPS Take 400 mg by mouth at bedtime.     Multiple Vitamin (MULTIVITAMIN) tablet Take 1 tablet by mouth daily.     Omega-3 Fatty Acids (FISH OIL) 1000 MG CAPS Take 1,000 mg by mouth daily.      rosuvastatin (CRESTOR) 40 MG tablet TAKE 1 TABLET BY MOUTH EVERY DAY 90 tablet 2   triamcinolone ointment (KENALOG) 0.1 % Apply 1 application topically 2 (two) times daily. 80 g 1   EPINEPHrine 0.3 mg/0.3 mL IJ SOAJ injection INJECT 0.3 MG INTO THE MUSCLE ONCE FOR 1 DOSE. (Patient not taking: Reported on 11/24/2022)     metoprolol succinate (TOPROL XL) 25 MG 24 hr tablet Take 1 tablet (25 mg total) by mouth daily. (Patient not taking: Reported on 11/24/2022) 90 tablet 3   omalizumab (XOLAIR) 150 MG/ML prefilled syringe INJECT 2 SYRINGES UNDER THE SKIN EVERY 4 WEEKS 2 mL 11   Current Facility-Administered Medications  Medication Dose Route Frequency Provider Last Rate Last Admin   0.9 %  sodium chloride infusion  500 mL Intravenous Once Imogene Burn, MD       omalizumab Geoffry Paradise) injection 300 mg  300 mg Subcutaneous Q28 days Alfonse Spruce, MD   300 mg at 10/21/22 1610    Allergies as of 11/24/2022 - Review Complete 11/24/2022  Allergen Reaction Noted   Penicillins Rash 02/22/2013   Doxycycline Rash 11/23/2022     Family History  Problem Relation Age of Onset   Diabetes Mother    Colon cancer Father 60 - 1   Cancer Father        colon   Cancer Paternal Grandmother        ovarian   Allergic rhinitis Neg Hx    Angioedema Neg Hx    Asthma Neg Hx    Eczema Neg Hx    Immunodeficiency Neg Hx    Urticaria Neg Hx    Rectal cancer Neg Hx    Stomach cancer Neg Hx  Esophageal cancer Neg Hx     Social History   Socioeconomic History   Marital status: Married    Spouse name: Not on file   Number of children: Not on file   Years of education: Not on file   Highest education level: Bachelor's degree (e.g., BA, AB, BS)  Occupational History   Not on file  Tobacco Use   Smoking status: Never   Smokeless tobacco: Never  Vaping Use   Vaping Use: Never used  Substance and Sexual Activity   Alcohol use: Yes    Comment: rare   Drug use: No   Sexual activity: Yes    Birth control/protection: Surgical  Other Topics Concern   Not on file  Social History Narrative   Not on file   Social Determinants of Health   Financial Resource Strain: Low Risk  (08/18/2022)   Overall Financial Resource Strain (CARDIA)    Difficulty of Paying Living Expenses: Not hard at all  Food Insecurity: No Food Insecurity (08/18/2022)   Hunger Vital Sign    Worried About Running Out of Food in the Last Year: Never true    Ran Out of Food in the Last Year: Never true  Transportation Needs: No Transportation Needs (08/18/2022)   PRAPARE - Administrator, Civil Service (Medical): No    Lack of Transportation (Non-Medical): No  Physical Activity: Insufficiently Active (08/18/2022)   Exercise Vital Sign    Days of Exercise per Week: 3 days    Minutes of Exercise per Session: 30 min  Stress: No Stress Concern Present (08/18/2022)   [employer] of Occupational Health - Occupational Stress Questionnaire    Feeling of Stress : Not at all  Social Connections: Unknown (08/18/2022)   Social Connection and  Isolation Panel [NHANES]    Frequency of Communication with Friends and Family: Patient declined    Frequency of Social Gatherings with Friends and Family: Patient declined    Attends Religious Services: More than 4 times per year    Active Member of [employer] or Organizations: Yes    Attends Engineer, structural: More than 4 times per year    Marital Status: Married  Catering manager Violence: Not on file    Physical Exam: Vital signs in last 24 hours: BP (!) 142/114   Pulse 69   Temp 98.4 F (36.9 C)   Resp 14   Ht 5\' 2"  (1.575 m)   Wt 152 lb (68.9 kg)   LMP 10/27/2017   SpO2 97%   BMI 27.80 kg/m  GEN: NAD EYE: Sclerae anicteric ENT: MMM CV: Non-tachycardic Pulm: No increased work of breathing GI: Soft, NT/ND NEURO:  Alert & Oriented   Eulah Pont, MD [facility]  11/24/2022 8:14 AM

## 2022-11-25 ENCOUNTER — Telehealth: Payer: Self-pay | Admitting: *Deleted

## 2022-11-25 ENCOUNTER — Ambulatory Visit (INDEPENDENT_AMBULATORY_CARE_PROVIDER_SITE_OTHER): Payer: 59

## 2022-11-25 DIAGNOSIS — L501 Idiopathic urticaria: Secondary | ICD-10-CM | POA: Diagnosis not present

## 2022-11-25 NOTE — Telephone Encounter (Signed)
Attempted to call patient for their post-procedure follow-up call. No answer. Left voicemail.   

## 2022-11-26 ENCOUNTER — Encounter: Payer: Self-pay | Admitting: Internal Medicine

## 2022-11-27 NOTE — Progress Notes (Signed)
Office Visit Note  Patient: Ann Bass             Date of Birth: Oct 14, 1970           MRN: [id number]             PCP: Sheliah Hatch, MD Referring: Sheliah Hatch, MD Visit Date: 12/11/2022 Occupation: @GUAROCC @  Subjective:  Positive ANA and fatigue.  History of Present Illness: Ann Bass is a 52 y.o. female seen in consultation per request of her PCP for the evaluation of positive ANA.  Patient states that she has had chronic fatigue since she was a teenager.  She states her symptoms gradually progressed.  She recalls 13 years ago after delivering her last child she started experiencing increased fatigue, increased brain fog, poor memory and hip pain.  She has had knee joint and ankle joint pain since she was in her 71s.  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  She still has difficulty descending due to knee joint discomfort.  She enjoys hiking but is difficult for her.  She continues to have heat intolerance poor memory and brain fog.  She was diagnosed with pre-Hashimoto's thyroiditis about 10 years ago by holistic doctor based on her labs.  She did not notice any improvement with the thyroid medications and her TSH has been normal.  She recently had labs which showed positive ANA and for that reason she was referred to me.  He had right foot bunionectomy on October 23, 2022 by Dr. Lilian Kapur.  She states that she has joint hypermobility and Dr. Abbott Pao felt that the feet issues were related to hypermobility as well.  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  She gives history of dry mouth, dry eyes and photosensitivity.  She denies any history of oral ulcers, Raynaud's, lymphadenopathy or inflammatory arthritis.  She gives history of generalized pain and achiness.  She complains of discomfort in her bilateral knees, gluteal region and bilateral feet.  She also has neck is  stiffness.  She has not noticed any joint swelling.  She states her daughter has hypermobility.  She is gravida 4, para 3, miscarriage 1.  There is no history of preeclampsia or DVTs.  Her father has ankylosing spondylitis and he goes to pain management.    Activities of Daily Living:  Patient reports morning stiffness for less than 5 minutes.   Patient Denies nocturnal pain.  Difficulty dressing/grooming: Denies Difficulty climbing stairs: Reports Difficulty getting out of chair: Denies Difficulty using hands for taps, buttons, cutlery, and/or writing: Denies  Review of Systems  Constitutional:  Positive for fatigue.  HENT:  Positive for mouth dryness. Negative for mouth sores.   Eyes:  Positive for dryness.  Respiratory:  Positive for shortness of breath.   Cardiovascular:  Negative for chest pain and palpitations.  Gastrointestinal:  Positive for constipation. Negative for blood in stool and diarrhea.  Endocrine: Positive for heat intolerance. Negative for increased urination.  Genitourinary:  Negative for involuntary urination.  Musculoskeletal:  Positive for joint pain, joint pain, muscle weakness and morning stiffness. Negative for gait problem, joint swelling, myalgias, muscle tenderness and myalgias.  Skin:  Positive for rash and sensitivity to sunlight. Negative for color change and hair loss.  Allergic/Immunologic: Negative for susceptible to infections.  Neurological:  Positive for dizziness. Negative for headaches.  Hematological:  Negative for swollen glands.  Psychiatric/Behavioral:  Negative  for depressed mood and sleep disturbance. The patient is not nervous/anxious.     PMFS History:  Patient Active Problem List   Diagnosis Date Noted   Hyperlipidemia 05/05/2022   Fatigue 05/05/2022   Upper airway cough syndrome 07/14/2021   Chronic rhinitis 07/14/2021   Splenic artery aneurysm (HCC) 03/04/2021   Allergic contact dermatitis 08/09/2020   Rash 08/09/2020    Steatosis of liver 01/13/2019   Renal cyst, left 01/13/2019   Overweight (BMI 25.0-29.9) 01/13/2019   OSA (obstructive sleep apnea) 09/02/2017   Ascending aorta dilatation (HCC)    SOB (shortness of breath) 02/18/2017   Excessive daytime sleepiness 02/18/2017   Vitamin D deficiency 09/17/2016   Physical exam 09/13/2014   Palpitations 09/13/2014   Decreased hearing of both ears 09/13/2014   Glucose intolerance (impaired glucose tolerance) 02/22/2013   Depression 02/22/2013   Hypothyroid 02/22/2013    Past Medical History:  Diagnosis Date   Allergy    Ascending aorta dilatation (HCC)    41mm on MRI 02/2021 and 44 mm by 2D echo 07-2021 and 44mm by Chest MRA 08/2022   Depression    post-partum   Dilated aortic root (HCC)    41mm by chest MRI 01/2020   Dyspnea    Dysrhythmia    occational   History of gestational diabetes    Hyperlipidemia    Hypertension    Hypothyroidism    Inflammatory polyps of colon (HCC)    Liver disease    OSA (obstructive sleep apnea) 09/02/2017   Mild OSA with an AHI of 6.8/h.  On CPAP   Palpitations    PACs noted on event monitor   Pre-diabetes    updated epic hx 08-23-2019 per pt she is pre-diabetic   Splenic artery aneurysm (HCC)    12mm on MRI 02/2021    Family History  Problem Relation Age of Onset   Diabetes Mother    Heart Problems Mother    Colon cancer Father 73 - 30   Cancer Father        colon   Ankylosing spondylitis Father    Healthy Sister    Healthy Sister    Cancer Paternal Grandmother        ovarian   Healthy Daughter    Healthy Daughter    Healthy Daughter    Allergic rhinitis Neg Hx    Angioedema Neg Hx    Asthma Neg Hx    Eczema Neg Hx    Immunodeficiency Neg Hx    Urticaria Neg Hx    Rectal cancer Neg Hx    Stomach cancer Neg Hx    Esophageal cancer Neg Hx    Past Surgical History:  Procedure Laterality Date   broken leg  1982   Left Femur, traction and pin in leg   BUNIONECTOMY Right 10/23/2022    COLONOSCOPY  2018   COLONOSCOPY  11/24/2022   FOOT SURGERY Right    ROBOTIC ASSISTED LAPAROSCOPIC HYSTERECTOMY AND SALPINGECTOMY Bilateral 08/24/2019   Procedure: XI ROBOTIC ASSISTED LAPAROSCOPIC HYSTERECTOMY AND SALPINGECTOMY;  Surgeon: Silverio Lay, MD;  Location: [facility];  Service: Gynecology;  Laterality: Bilateral;   WISDOM TOOTH EXTRACTION  1998   Social History   Social History Narrative   Not on file   Immunization History  Administered Date(s) Administered   Influenza, Quadrivalent, Recombinant, Inj, Pf 04/21/2018   Influenza,inj,Quad PF,6+ Mos 05/25/2014, 01/13/2019, 04/17/2020, 05/06/2021, 05/05/2022   PFIZER Comirnaty(Gray Top)Covid-19 Tri-Sucrose Vaccine 12/12/2020   PFIZER(Purple Top)SARS-COV-2 Vaccination 10/24/2019, 11/14/2019  Tdap 09/13/2014   Unspecified SARS-COV-2 Vaccination 10/17/2019, 11/16/2019   Zoster Recombinant(Shingrix) 05/22/2022, 09/04/2022     Objective: Vital Signs: BP 125/81 (BP Location: Right Arm, Patient Position: Sitting, Cuff Size: Normal)   Pulse 60   Resp 15   Ht 5\' 2"  (1.575 m)   Wt 150 lb 6.4 oz (68.2 kg)   LMP 10/27/2017   BMI 27.51 kg/m    Physical Exam Vitals and nursing note reviewed.  Constitutional:      Appearance: She is well-developed.  HENT:     Head: Normocephalic and atraumatic.  Eyes:     Conjunctiva/sclera: Conjunctivae normal.  Cardiovascular:     Rate and Rhythm: Normal rate and regular rhythm.     Heart sounds: Normal heart sounds.  Pulmonary:     Effort: Pulmonary effort is normal.     Breath sounds: Normal breath sounds.  Abdominal:     General: Bowel sounds are normal.     Palpations: Abdomen is soft.  Musculoskeletal:     Cervical back: Normal range of motion.  Lymphadenopathy:     Cervical: No cervical adenopathy.  Skin:    General: Skin is warm and dry.     Capillary Refill: Capillary refill takes less than 2 seconds.  Neurological:     Mental Status: She is alert and  oriented to person, place, and time.  Psychiatric:        Behavior: Behavior normal.      Musculoskeletal Exam: Cervical, thoracic and lumbar spine were in good range of motion.  She had tenderness over bilateral SI joints.  Shoulder joints, elbow joints, wrist joints were in good range of motion.  She has hypermobility in MCP, PIP and DIP joints.  No synovitis was noted.  Hip joints and knee joints were in good range of motion without any warmth swelling or effusion.  Right foot was in a boot.  Left foot had pes planus.  No synovitis or joint thickening was noted.  CDAI Exam: CDAI Score: -- Patient Global: --; Provider Global: -- Swollen: --; Tender: -- Joint Exam 12/11/2022   No joint exam has been documented for this visit   There is currently no information documented on the homunculus. Go to the Rheumatology activity and complete the homunculus joint exam.  Investigation: No additional findings.  Imaging: CT ANGIO ABDOMEN PELVIS  W & WO CONTRAST  Result Date: 12/10/2022 CLINICAL DATA:  Ascending aortic dilation, assess for aneurysms EXAM: CT ANGIOGRAPHY CHEST, ABDOMEN AND PELVIS TECHNIQUE: Non-contrast CT of the chest was initially obtained. Multidetector CT imaging through the chest, abdomen and pelvis was performed using the standard protocol during bolus administration of intravenous contrast. Multiplanar reconstructed images and MIPs were obtained and reviewed to evaluate the vascular anatomy. RADIATION DOSE REDUCTION: This exam was performed according to the departmental dose-optimization program which includes automated exposure control, adjustment of the mA and/or kV according to patient size and/or use of iterative reconstruction technique. CONTRAST:  OMNIPAQUE IOHEXOL 350 MG/ML SOLN COMPARISON:  MRA chest 09/02/2022; MRI abdomen 10/28/2017; CT a chest 12/22/2018 FINDINGS: CTA CHEST FINDINGS Cardiovascular: Conventional 3 vessel arch anatomy. The aortic root remains normal  in caliber at approximately 3.6 cm measured at the sinuses of Valsalva. Stable mild fusiform aneurysmal dilation of the ascending thoracic aorta at 4.1 cm. No evidence of dissection. The transverse and descending thoracic aorta are normal in caliber. The heart is normal in size. Normal appearance of the pulmonary artery. No pericardial effusion. Mediastinum/Nodes: Unremarkable CT appearance of  the thyroid gland. No suspicious mediastinal or hilar adenopathy. No soft tissue mediastinal mass. The thoracic esophagus is unremarkable. Lungs/Pleura: Lungs are clear. No pleural effusion or pneumothorax. Musculoskeletal: No chest wall abnormality. No acute or significant osseous findings. Review of the MIP images confirms the above findings. CTA ABDOMEN AND PELVIS FINDINGS VASCULAR Aorta: The abdominal aorta is normal in caliber. Trace atherosclerotic calcifications. No evidence of dissection. Celiac: The celiac artery axis is widely patent without evidence of stenosis or dissection. There is a focal aneurysm of the splenic artery measuring 1.3 x 1.2 x 1.4 cm (see coronal image 52 of series 5 and sagittal image 62 of series 4). This is unchanged compared to prior imaging from August of 2020. SMA: Patent without evidence of aneurysm, dissection, vasculitis or significant stenosis. Renals: Both renal arteries are patent without evidence of aneurysm, dissection, vasculitis, fibromuscular dysplasia or significant stenosis. IMA: Patent without evidence of aneurysm, dissection, vasculitis or significant stenosis. Inflow: Patent without evidence of aneurysm, dissection, vasculitis or significant stenosis. Veins: No focal venous abnormality. Review of the MIP images confirms the above findings. NON-VASCULAR Hepatobiliary: Low attenuation of the hepatic parenchyma consistent with steatosis. Gallbladder is unremarkable. No intra or extrahepatic biliary ductal dilatation. Pancreas: Unremarkable. No pancreatic ductal dilatation or  surrounding inflammatory changes. Spleen: No splenic injury or perisplenic hematoma. Adrenals/Urinary Tract: Normal adrenal glands. No hydronephrosis, nephrolithiasis or enhancing renal mass. Stable simple renal cysts as previously described. No imaging follow-up is recommended. Stomach/Bowel: No evidence of obstruction or focal bowel wall thickening. Normal appendix in the right lower quadrant. The terminal ileum is unremarkable. Lymphatic: No suspicious lymphadenopathy. Reproductive: Surgical changes of prior hysterectomy. Simple 3.4 cm cyst affiliated with the right ovary. No imaging follow-up is recommended. Other: No abdominal wall hernia or abnormality. No abdominopelvic ascites. Musculoskeletal: No acute fracture or aggressive appearing lytic or blastic osseous lesion. Review of the MIP images confirms the above findings. IMPRESSION: 1. Stable mild fusiform aneurysmal dilation of the ascending thoracic aorta at 4.1 cm. Recommend annual imaging followup by CTA or MRA. This recommendation follows 2010 ACCF/AHA/AATS/ACR/ASA/SCA/SCAI/SIR/STS/SVM Guidelines for the Diagnosis and Management of Patients with Thoracic Aortic Disease. Circulation. 2010; 121: H846-N629. Aortic aneurysm NOS (ICD10-I71.9) 2. Stable splenic artery aneurysm measuring up to 1.4 cm. There is been no substantive change in the size or configuration of this aneurysm dating back to at least August of 2020. 3. No evidence of abdominal aortic aneurysm. 4. Trace atherosclerotic calcifications along the aorta. Aortic Atherosclerosis (ICD10-I70.0). 5. Hepatic steatosis. 6. Additional ancillary findings as above. Signed, Sterling Big, MD, RPVI [facility] Electronically Signed   By: Malachy Moan M.D.   On: 12/10/2022 08:20   CT ANGIO CHEST AORTA W/CM & OR WO/CM  Result Date: 12/10/2022 CLINICAL DATA:  Ascending aortic dilation, assess for aneurysms EXAM: CT ANGIOGRAPHY CHEST,  ABDOMEN AND PELVIS TECHNIQUE: Non-contrast CT of the chest was initially obtained. Multidetector CT imaging through the chest, abdomen and pelvis was performed using the standard protocol during bolus administration of intravenous contrast. Multiplanar reconstructed images and MIPs were obtained and reviewed to evaluate the vascular anatomy. RADIATION DOSE REDUCTION: This exam was performed according to the departmental dose-optimization program which includes automated exposure control, adjustment of the mA and/or kV according to patient size and/or use of iterative reconstruction technique. CONTRAST:  OMNIPAQUE IOHEXOL 350 MG/ML SOLN COMPARISON:  MRA chest 09/02/2022; MRI abdomen 10/28/2017; CT a chest 12/22/2018 FINDINGS: CTA CHEST FINDINGS Cardiovascular: Conventional 3 vessel arch anatomy.  The aortic root remains normal in caliber at approximately 3.6 cm measured at the sinuses of Valsalva. Stable mild fusiform aneurysmal dilation of the ascending thoracic aorta at 4.1 cm. No evidence of dissection. The transverse and descending thoracic aorta are normal in caliber. The heart is normal in size. Normal appearance of the pulmonary artery. No pericardial effusion. Mediastinum/Nodes: Unremarkable CT appearance of the thyroid gland. No suspicious mediastinal or hilar adenopathy. No soft tissue mediastinal mass. The thoracic esophagus is unremarkable. Lungs/Pleura: Lungs are clear. No pleural effusion or pneumothorax. Musculoskeletal: No chest wall abnormality. No acute or significant osseous findings. Review of the MIP images confirms the above findings. CTA ABDOMEN AND PELVIS FINDINGS VASCULAR Aorta: The abdominal aorta is normal in caliber. Trace atherosclerotic calcifications. No evidence of dissection. Celiac: The celiac artery axis is widely patent without evidence of stenosis or dissection. There is a focal aneurysm of the splenic artery measuring 1.3 x 1.2 x 1.4 cm (see coronal image 52 of series 5 and  sagittal image 62 of series 4). This is unchanged compared to prior imaging from August of 2020. SMA: Patent without evidence of aneurysm, dissection, vasculitis or significant stenosis. Renals: Both renal arteries are patent without evidence of aneurysm, dissection, vasculitis, fibromuscular dysplasia or significant stenosis. IMA: Patent without evidence of aneurysm, dissection, vasculitis or significant stenosis. Inflow: Patent without evidence of aneurysm, dissection, vasculitis or significant stenosis. Veins: No focal venous abnormality. Review of the MIP images confirms the above findings. NON-VASCULAR Hepatobiliary: Low attenuation of the hepatic parenchyma consistent with steatosis. Gallbladder is unremarkable. No intra or extrahepatic biliary ductal dilatation. Pancreas: Unremarkable. No pancreatic ductal dilatation or surrounding inflammatory changes. Spleen: No splenic injury or perisplenic hematoma. Adrenals/Urinary Tract: Normal adrenal glands. No hydronephrosis, nephrolithiasis or enhancing renal mass. Stable simple renal cysts as previously described. No imaging follow-up is recommended. Stomach/Bowel: No evidence of obstruction or focal bowel wall thickening. Normal appendix in the right lower quadrant. The terminal ileum is unremarkable. Lymphatic: No suspicious lymphadenopathy. Reproductive: Surgical changes of prior hysterectomy. Simple 3.4 cm cyst affiliated with the right ovary. No imaging follow-up is recommended. Other: No abdominal wall hernia or abnormality. No abdominopelvic ascites. Musculoskeletal: No acute fracture or aggressive appearing lytic or blastic osseous lesion. Review of the MIP images confirms the above findings. IMPRESSION: 1. Stable mild fusiform aneurysmal dilation of the ascending thoracic aorta at 4.1 cm. Recommend annual imaging followup by CTA or MRA. This recommendation follows 2010 ACCF/AHA/AATS/ACR/ASA/SCA/SCAI/SIR/STS/SVM Guidelines for the Diagnosis and Management  of Patients with Thoracic Aortic Disease. Circulation. 2010; 121: [id number]. Aortic aneurysm NOS (ICD10-I71.9) 2. Stable splenic artery aneurysm measuring up to 1.4 cm. There is been no substantive change in the size or configuration of this aneurysm dating back to at least August of 2020. 3. No evidence of abdominal aortic aneurysm. 4. Trace atherosclerotic calcifications along the aorta. Aortic Atherosclerosis (ICD10-I70.0). 5. Hepatic steatosis. 6. Additional ancillary findings as above. Signed, Sterling Big, MD, RPVI [facility] Electronically Signed   By: Malachy Moan M.D.   On: 12/10/2022 08:20   DG Foot Complete Right  Result Date: 12/02/2022 Please see detailed radiograph report in office note.   Recent Labs: Lab Results  Component Value Date   WBC 8.8 12/08/2022   HGB 15.0 12/08/2022   PLT 314.0 12/08/2022   NA 138 12/08/2022   K 4.1 12/08/2022   CL 103 12/08/2022   CO2 28 12/08/2022   GLUCOSE 98 12/08/2022   BUN 10 12/08/2022  CREATININE 0.60 12/08/2022   BILITOT 0.5 12/08/2022   ALKPHOS 51 12/08/2022   AST 22 12/08/2022   ALT 26 12/08/2022   PROT 6.7 12/08/2022   ALBUMIN 4.3 12/08/2022   CALCIUM 9.4 12/08/2022   GFRAA 122 12/22/2019   December 08, 2022 TSH 3.00, vitamin D59.89 Speciality Comments: No specialty comments available.  Procedures:  No procedures performed Allergies: Penicillins and Doxycycline   Assessment / Plan:     Visit Diagnoses: Positive ANA (antinuclear antibody) - 05/05/22: ANA 1:80NS, 1:80 NH, 1:80 cytoplasmic, ESR 4. -Patient has low titer positive ANA.  She gives history of fatigue, photosensitivity, dry mouth and dry eyes.  There is no history of oral ulcers, nasal ulcers, malar rash, Raynaud's, inflammatory arthritis or lymphadenopathy.  I will obtain additional labs today.  Plan: Anti-Smith antibody, Sjogrens syndrome-A extractable nuclear antibody, Sjogrens syndrome-B  extractable nuclear antibody, Anti-DNA antibody, double-stranded, C3 and C4, RNP Antibody  Neck pain -she gives history of pain and stiffness in her cervical spine.  She had good mobility.  There is no radiculopathy.  Plan: XR Cervical Spine 2 or 3 views.  X-rays of the cervical spine were unremarkable.  Chronic SI joint pain -patient has chronic discomfort in her SI joints.  She also gives history of morning stiffness.  She is concerned because her father has ankylosing spondylitis.  Plan: XR Pelvis 1-2 Views, no SI joint narrowing or renal changes were noted.  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 HLA-B27 antigen  Chronic pain of both knees -she gives history of pain and discomfort of bilateral knee joints since she was very young.  She was diagnosed with patellofemoral syndrome in the past.  She denies any history of joint swelling.  She would like to see x-rays of her knees today due to ongoing discomfort.  Plan: XR KNEE 3 VIEW RIGHT, XR KNEE 3 VIEW LEFT, x-rays of bilateral knee joints were unremarkable.  Rheumatoid factor, Cyclic citrul peptide antibody, IgG  Pes planus of both feet-patient had pes planus.  S/P bunionectomy - Right October 23, 2022 by Dr. Lilian Kapur  Hypermobility of joint-she has hypermobility in some of her joints especially the smaller joints.  Other fatigue-she gives history of fatigue since she was a teenager.  She also gives history of brain fog and poor memory.  Her symptoms are consistent with chronic fatigue syndrome.  Myalgia -she gives history of generalized pain and myalgias.  No muscular weakness was noted.  Possibility of myofascial pain syndrome was discussed.  Benefits of water aerobics and summing were discussed.  Plan: CK  Brain fog-most likely related to chronic fatigue.  Other medical problems are listed as follows:  Splenic artery aneurysm (HCC) - followed by Dr. Mayford Knife  Ascending aorta dilatation (HCC)  Steatosis of  liver  History of hyperlipidemia  History of hypothyroidism - Plan: Thyroid peroxidase antibody, Thyroglobulin antibody  Vitamin D deficiency  History of depression  OSA (obstructive sleep apnea) - on CPAP  Family history of ankylosing spondylitis-father  Orders: Orders Placed This Encounter  Procedures   XR Cervical Spine 2 or 3 views   XR Pelvis 1-2 Views   XR KNEE 3 VIEW RIGHT   XR KNEE 3 VIEW LEFT   CK   Rheumatoid factor   Cyclic citrul peptide antibody, IgG   Anti-Smith antibody   Sjogrens syndrome-A extractable nuclear antibody   Sjogrens syndrome-B extractable nuclear antibody   Anti-DNA antibody, double-stranded   C3 and C4   RNP Antibody   Thyroid  peroxidase antibody   Thyroglobulin antibody   HLA-B27 antigen   No orders of the defined types were placed in this encounter.    Follow-Up Instructions: Return for Fatigue, positive ANA.   Pollyann Savoy, MD  Note - This record has been created using Animal nutritionist.  Chart creation errors have been sought, but may not always  have been located. Such creation errors do not reflect on  the standard of medical care.

## 2022-12-01 ENCOUNTER — Encounter: Payer: Self-pay | Admitting: Cardiology

## 2022-12-02 ENCOUNTER — Ambulatory Visit (INDEPENDENT_AMBULATORY_CARE_PROVIDER_SITE_OTHER): Payer: 59

## 2022-12-02 ENCOUNTER — Ambulatory Visit: Payer: 59 | Admitting: Rheumatology

## 2022-12-02 ENCOUNTER — Ambulatory Visit (INDEPENDENT_AMBULATORY_CARE_PROVIDER_SITE_OTHER): Payer: 59 | Admitting: Podiatry

## 2022-12-02 DIAGNOSIS — M2011 Hallux valgus (acquired), right foot: Secondary | ICD-10-CM

## 2022-12-02 DIAGNOSIS — M21611 Bunion of right foot: Secondary | ICD-10-CM | POA: Diagnosis not present

## 2022-12-02 DIAGNOSIS — Z9889 Other specified postprocedural states: Secondary | ICD-10-CM

## 2022-12-02 DIAGNOSIS — Q666 Other congenital valgus deformities of feet: Secondary | ICD-10-CM | POA: Diagnosis not present

## 2022-12-02 NOTE — Progress Notes (Signed)
  Subjective:  Patient ID: Ann Bass, female    DOB: 22-Dec-1970,  MRN: [id number]  No chief complaint on file.     52 y.o. female returns for post-op check.  Overall she is doing okay, having minimal pain.  Review of Systems: Negative except as noted in the HPI. Denies N/V/F/Ch.   Objective:  There were no vitals filed for this visit. There is no height or weight on file to calculate BMI. Constitutional Well developed. Well nourished.  Vascular Foot warm and well perfused. Capillary refill normal to all digits.  Calf is soft and supple, no posterior calf or knee pain, negative Homans' sign  Neurologic Normal speech. Oriented to person, place, and time. Epicritic sensation to light touch grossly present bilaterally.  Dermatologic Skin healing well without signs of infection. Skin edges well coapted without signs of infection.  Orthopedic: Tenderness to palpation noted about the surgical site.   Multiple view plain film radiographs: Good correction noted no complication of hardware Assessment:   1. Hallux valgus with bunions, right   2. Status post foot surgery   3. Pes planovalgus    Plan:  Patient was evaluated and treated and all questions answered.  S/p foot surgery right -Progressing as expected post-operatively. -XR: Noted above no complication of hardware -WB Status: Weightbearing as tolerated in cam boot/surgical shoe -Sutures: Removed.  No signs of Deis are noted.  No complication noted. -Medications: No refills required -Clinically healing well.  No further maceration noted.  Pes planovalgus -I explained to patient the etiology of pes planovalgus and relationship with Planter fasciitis and various treatment options were discussed.  Given patient foot structure in the setting of Planter fasciitis I believe patient will benefit from custom-made orthotics to help control the hindfoot motion support the arch of the foot and take the stress away from plantar  fascial.  Patient agrees with the plan like to proceed with orthotics -Patient was casted for orthotics   No follow-ups on file.

## 2022-12-08 ENCOUNTER — Ambulatory Visit: Payer: 59 | Admitting: Family Medicine

## 2022-12-08 ENCOUNTER — Encounter: Payer: Self-pay | Admitting: Family Medicine

## 2022-12-08 ENCOUNTER — Telehealth: Payer: Self-pay

## 2022-12-08 VITALS — BP 116/78 | HR 65 | Temp 97.9°F | Resp 18 | Ht 62.0 in | Wt 150.1 lb

## 2022-12-08 DIAGNOSIS — E785 Hyperlipidemia, unspecified: Secondary | ICD-10-CM

## 2022-12-08 DIAGNOSIS — Z0000 Encounter for general adult medical examination without abnormal findings: Secondary | ICD-10-CM | POA: Diagnosis not present

## 2022-12-08 DIAGNOSIS — E559 Vitamin D deficiency, unspecified: Secondary | ICD-10-CM

## 2022-12-08 LAB — HEPATIC FUNCTION PANEL
ALT: 26 U/L (ref 0–35)
AST: 22 U/L (ref 0–37)
Albumin: 4.3 g/dL (ref 3.5–5.2)
Alkaline Phosphatase: 51 U/L (ref 39–117)
Bilirubin, Direct: 0.1 mg/dL (ref 0.0–0.3)
Total Bilirubin: 0.5 mg/dL (ref 0.2–1.2)
Total Protein: 6.7 g/dL (ref 6.0–8.3)

## 2022-12-08 LAB — VITAMIN D 25 HYDROXY (VIT D DEFICIENCY, FRACTURES): VITD: 59.89 ng/mL (ref 30.00–100.00)

## 2022-12-08 LAB — LIPID PANEL
Cholesterol: 113 mg/dL (ref 0–200)
HDL: 45.6 mg/dL (ref >39.00)
LDL Cholesterol: 44 mg/dL (ref 0–99)
NonHDL: 67.07
Total CHOL/HDL Ratio: 2
Triglycerides: 116 mg/dL (ref 0.0–149.0)
VLDL: 23.2 mg/dL (ref 0.0–40.0)

## 2022-12-08 LAB — CBC WITH DIFFERENTIAL/PLATELET
Basophils Absolute: 0.1 10*3/uL (ref 0.0–0.1)
Basophils Relative: 0.6 % (ref 0.0–3.0)
Eosinophils Absolute: 0.3 10*3/uL (ref 0.0–0.7)
Eosinophils Relative: 2.9 % (ref 0.0–5.0)
HCT: 45.6 % (ref 36.0–46.0)
Hemoglobin: 15 g/dL (ref 12.0–15.0)
Lymphocytes Relative: 17.9 % (ref 12.0–46.0)
Lymphs Abs: 1.6 10*3/uL (ref 0.7–4.0)
MCHC: 32.8 g/dL (ref 30.0–36.0)
MCV: 89.2 fl (ref 78.0–100.0)
Monocytes Absolute: 0.4 10*3/uL (ref 0.1–1.0)
Monocytes Relative: 4.4 % (ref 3.0–12.0)
Neutro Abs: 6.5 10*3/uL (ref 1.4–7.7)
Neutrophils Relative %: 74.2 % (ref 43.0–77.0)
Platelets: 314 10*3/uL (ref 150.0–400.0)
RBC: 5.11 Mil/uL (ref 3.87–5.11)
RDW: 13.7 % (ref 11.5–15.5)
WBC: 8.8 10*3/uL (ref 4.0–10.5)

## 2022-12-08 LAB — BASIC METABOLIC PANEL
BUN: 10 mg/dL (ref 6–23)
CO2: 28 mEq/L (ref 19–32)
Calcium: 9.4 mg/dL (ref 8.4–10.5)
Chloride: 103 mEq/L (ref 96–112)
Creatinine, Ser: 0.6 mg/dL (ref 0.40–1.20)
GFR: 103.63 mL/min (ref >60.00)
Glucose, Bld: 98 mg/dL (ref 70–99)
Potassium: 4.1 mEq/L (ref 3.5–5.1)
Sodium: 138 mEq/L (ref 135–145)

## 2022-12-08 LAB — TSH: TSH: 3 u[IU]/mL (ref 0.35–5.50)

## 2022-12-08 NOTE — Patient Instructions (Addendum)
Follow up in 6 months to recheck blood pressure and cholesterol We'll notify you of your lab results and make any changes if needed Keep up the good work on healthy diet and regular exercise- you look great! Call with any questions or concerns Stay Safe!  Stay Healthy! Enjoy your trip!!!

## 2022-12-08 NOTE — Progress Notes (Signed)
   Subjective:    Patient ID: Ann Bass, female    DOB: 1970/11/08, 52 y.o.   MRN: [id number]  HPI CPE- UTD on mammo, colonoscopy, Tdap.  Getting information on pap.  Patient Care Team    Relationship Specialty Notifications Start End  Sheliah Hatch, MD PCP - General Family Medicine  02/22/13   Quintella Reichert, MD PCP - Cardiology Cardiology  07/25/19   Silverio Lay, MD Consulting Physician Obstetrics and Gynecology  09/13/14   Chari Manning, MD Referring Physician Family Medicine  09/13/14   Quintella Reichert, MD Consulting Physician Cardiology  02/18/17      Health Maintenance  Topic Date Due   PAP SMEAR-Modifier  06/13/2022   INFLUENZA VACCINE  12/17/2022   MAMMOGRAM  12/25/2022   DTaP/Tdap/Td (2 - Td or Tdap) 09/12/2024   Colonoscopy  11/24/2027   Zoster Vaccines- Shingrix  Completed   HPV VACCINES  Aged Out   COVID-19 Vaccine  Discontinued   Hepatitis C Screening  Discontinued   HIV Screening  Discontinued      Review of Systems Patient reports no vision changes, adenopathy,fever, weight change,  persistant/recurrent hoarseness , swallowing issues, chest pain, palpitations, edema, persistant/recurrent cough, hemoptysis, dyspnea (rest/exertional/paroxysmal nocturnal), gastrointestinal bleeding (melena, rectal bleeding), abdominal pain, significant heartburn, bowel changes, GU symptoms (dysuria, hematuria, incontinence), Gyn symptoms (abnormal  bleeding, pain),  syncope, focal weakness, memory loss, numbness & tingling, skin/hair/nail changes, abnormal bruising or bleeding, anxiety, or depression.   + decreased hearing    Objective:   Physical Exam General Appearance:    Alert, cooperative, no distress, appears stated age  Head:    Normocephalic, without obvious abnormality, atraumatic  Eyes:    PERRL, conjunctiva/corneas clear, EOM's intact both eyes  Ears:    Normal TM's and external ear canals, both ears  Nose:   Nares normal, septum midline, mucosa  normal, no drainage    or sinus tenderness  Throat:   Lips, mucosa, and tongue normal; teeth and gums normal  Neck:   Supple, symmetrical, trachea midline, no adenopathy;    Thyroid: no enlargement/tenderness/nodules  Back:     Symmetric, no curvature, ROM normal, no CVA tenderness  Lungs:     Clear to auscultation bilaterally, respirations unlabored  Chest Wall:    No tenderness or deformity   Heart:    Regular rate and rhythm, S1 and S2 normal, no murmur, rub   or gallop  Breast Exam:    Deferred to GYN  Abdomen:     Soft, non-tender, bowel sounds active all four quadrants,    no masses, no organomegaly  Genitalia:    Deferred to GYN  Rectal:    Extremities:   Extremities normal, atraumatic, no cyanosis or edema  Pulses:   2+ and symmetric all extremities  Skin:   Skin color, texture, turgor normal, no rashes or lesions  Lymph nodes:   Cervical, supraclavicular, and axillary nodes normal  Neurologic:   CNII-XII intact, normal strength, sensation and reflexes    throughout          Assessment & Plan:

## 2022-12-08 NOTE — Assessment & Plan Note (Signed)
Pt's PE WNL.  UTD on pap, mammo, colonoscopy, Tdap.  Check labs.  Anticipatory guidance provided.  

## 2022-12-08 NOTE — Telephone Encounter (Signed)
Called patient to discuss Dr. Norris Cross questions about her salt intake. Patient states she rarely eats out but she doesn't track her salt intake with her cooking.  Patient requests that she track/decrease her salt intake for another week, as well as her Blood pressures, and submit another blood pressure log before she has her medication regimen changed.   Submitted patient responses to Dr. Mayford Knife.

## 2022-12-09 ENCOUNTER — Ambulatory Visit (HOSPITAL_COMMUNITY)
Admission: RE | Admit: 2022-12-09 | Discharge: 2022-12-09 | Disposition: A | Discharge status: Home / Self Care | Payer: 59 | Source: Ambulatory Visit | Attending: Surgery | Admitting: Surgery

## 2022-12-09 ENCOUNTER — Other Ambulatory Visit (HOSPITAL_COMMUNITY): Payer: 59

## 2022-12-09 ENCOUNTER — Telehealth: Payer: Self-pay

## 2022-12-09 DIAGNOSIS — I77810 Thoracic aortic ectasia: Secondary | ICD-10-CM

## 2022-12-09 MED ORDER — IOHEXOL 350 MG/ML SOLN
100.0000 mL | Freq: Once | INTRAVENOUS | Status: AC | PRN
  Administered 2022-12-09: 100 mL via INTRAVENOUS

## 2022-12-09 NOTE — Telephone Encounter (Signed)
-----   Message from Neena Rhymes sent at 12/09/2022  7:35 AM EDT ----- Labs look great!  No changes at this time

## 2022-12-11 ENCOUNTER — Ambulatory Visit: Payer: 59

## 2022-12-11 ENCOUNTER — Encounter: Payer: Self-pay | Admitting: Rheumatology

## 2022-12-11 ENCOUNTER — Ambulatory Visit: Payer: 59 | Attending: Rheumatology | Admitting: Rheumatology

## 2022-12-11 ENCOUNTER — Ambulatory Visit (INDEPENDENT_AMBULATORY_CARE_PROVIDER_SITE_OTHER): Payer: 59

## 2022-12-11 VITALS — BP 125/81 | HR 60 | Resp 15 | Ht 62.0 in | Wt 150.4 lb

## 2022-12-11 DIAGNOSIS — Z8269 Family history of other diseases of the musculoskeletal system and connective tissue: Secondary | ICD-10-CM

## 2022-12-11 DIAGNOSIS — M25562 Pain in left knee: Secondary | ICD-10-CM | POA: Diagnosis not present

## 2022-12-11 DIAGNOSIS — G8929 Other chronic pain: Secondary | ICD-10-CM | POA: Diagnosis not present

## 2022-12-11 DIAGNOSIS — M2141 Flat foot [pes planus] (acquired), right foot: Secondary | ICD-10-CM

## 2022-12-11 DIAGNOSIS — M791 Myalgia, unspecified site: Secondary | ICD-10-CM

## 2022-12-11 DIAGNOSIS — I728 Aneurysm of other specified arteries: Secondary | ICD-10-CM

## 2022-12-11 DIAGNOSIS — E559 Vitamin D deficiency, unspecified: Secondary | ICD-10-CM

## 2022-12-11 DIAGNOSIS — M542 Cervicalgia: Secondary | ICD-10-CM

## 2022-12-11 DIAGNOSIS — M249 Joint derangement, unspecified: Secondary | ICD-10-CM

## 2022-12-11 DIAGNOSIS — R768 Other specified abnormal immunological findings in serum: Secondary | ICD-10-CM

## 2022-12-11 DIAGNOSIS — M533 Sacrococcygeal disorders, not elsewhere classified: Secondary | ICD-10-CM | POA: Diagnosis not present

## 2022-12-11 DIAGNOSIS — Z9889 Other specified postprocedural states: Secondary | ICD-10-CM

## 2022-12-11 DIAGNOSIS — M25561 Pain in right knee: Secondary | ICD-10-CM

## 2022-12-11 DIAGNOSIS — M2142 Flat foot [pes planus] (acquired), left foot: Secondary | ICD-10-CM

## 2022-12-11 DIAGNOSIS — G4733 Obstructive sleep apnea (adult) (pediatric): Secondary | ICD-10-CM

## 2022-12-11 DIAGNOSIS — Z8659 Personal history of other mental and behavioral disorders: Secondary | ICD-10-CM

## 2022-12-11 DIAGNOSIS — R5383 Other fatigue: Secondary | ICD-10-CM

## 2022-12-11 DIAGNOSIS — K760 Fatty (change of) liver, not elsewhere classified: Secondary | ICD-10-CM

## 2022-12-11 DIAGNOSIS — Z8639 Personal history of other endocrine, nutritional and metabolic disease: Secondary | ICD-10-CM

## 2022-12-11 DIAGNOSIS — I77810 Thoracic aortic ectasia: Secondary | ICD-10-CM

## 2022-12-11 DIAGNOSIS — R4189 Other symptoms and signs involving cognitive functions and awareness: Secondary | ICD-10-CM

## 2022-12-11 LAB — C3 AND C4
C3 Complement: 120 mg/dL (ref 83–193)
C4 Complement: 35 mg/dL (ref 15–57)

## 2022-12-11 NOTE — Telephone Encounter (Signed)
Called patient to discuss Dr. Norris Cross recommendations, no answer, left detailed message per [facility] asking patient to track salt intake and BP measurements for another week and call us with results. Gave office number for patient to call back.

## 2022-12-15 NOTE — Progress Notes (Signed)
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  Rest the labs were unremarkable.  I will discuss results at the follow-up visit.

## 2022-12-23 ENCOUNTER — Ambulatory Visit (INDEPENDENT_AMBULATORY_CARE_PROVIDER_SITE_OTHER): Payer: 59

## 2022-12-23 ENCOUNTER — Ambulatory Visit: Payer: Self-pay

## 2022-12-23 DIAGNOSIS — L501 Idiopathic urticaria: Secondary | ICD-10-CM

## 2022-12-24 NOTE — Telephone Encounter (Signed)
Called patient to request bp readings, no answer. Left detailed message per DPR asking patient to call our office to report diet changes/bp readings.

## 2022-12-28 ENCOUNTER — Encounter: Payer: Self-pay | Admitting: Surgery

## 2022-12-28 ENCOUNTER — Ambulatory Visit (INDEPENDENT_AMBULATORY_CARE_PROVIDER_SITE_OTHER): Payer: 59 | Admitting: Surgery

## 2022-12-28 VITALS — BP 129/85 | HR 67 | Temp 97.7°F | Wt 150.0 lb

## 2022-12-28 DIAGNOSIS — I728 Aneurysm of other specified arteries: Secondary | ICD-10-CM | POA: Diagnosis not present

## 2022-12-28 NOTE — Progress Notes (Signed)
[facility]  Patient name: Ann Bass MRN: [id number] DOB: 11-06-1970 Sex: female   REASON FOR VISIT:    Follow up  HISOTRY OF PRESENT ILLNESS:    Ann Bass is a 52 y.o. female who I saw in 2021 for evaluation of a splenic artery aneurysm that was incidentally found on a scan for her obstructive sleep apnea and ascending aortic aneurysm.  The maximum diameter was 15 mm.  It had not changed in over 2 years.  She was without pain.  She has 3 children with no plans for additional kids.  She denies any abdominal pain  The patient is on a statin for hypercholesterolemia. Maximum ascending aortic diameter is 4.1 cm.  PAST MEDICAL HISTORY:   Past Medical History:  Diagnosis Date   Allergy    Ascending aorta dilatation (HCC)    41mm on MRI 02/2021 and 44 mm by 2D echo 07-2021 and 44mm by Chest MRA 08/2022   Depression    post-partum   Dilated aortic root (HCC)    41mm by chest MRI 01/2020   Dyspnea    Dysrhythmia    occational   History of gestational diabetes    Hyperlipidemia    Hypertension    Hypothyroidism    Inflammatory polyps of colon (HCC)    Liver disease    OSA (obstructive sleep apnea) 09/02/2017   Mild OSA with an AHI of 6.8/h.  On CPAP   Palpitations    PACs noted on event monitor   Pre-diabetes    updated epic hx 08-23-2019 per pt she is pre-diabetic   Splenic artery aneurysm (HCC)    12mm on MRI 02/2021     FAMILY HISTORY:   Family History  Problem Relation Age of Onset   Diabetes Mother    Heart Problems Mother    Colon cancer Father 70 - 53   Cancer Father        colon   Ankylosing spondylitis Father    Healthy Sister    Healthy Sister    Cancer Paternal Grandmother        ovarian   Healthy Daughter    Healthy Daughter    Healthy Daughter    Allergic rhinitis Neg Hx    Angioedema Neg Hx    Asthma Neg Hx    Eczema Neg Hx    Immunodeficiency Neg Hx    Urticaria Neg  Hx    Rectal cancer Neg Hx    Stomach cancer Neg Hx    Esophageal cancer Neg Hx     SOCIAL HISTORY:   Social History   Tobacco Use   Smoking status: Never    Passive exposure: Never   Smokeless tobacco: Never  Substance Use Topics   Alcohol use: Yes    Comment: rare     ALLERGIES:   Allergies  Allergen Reactions   Penicillins Rash   Doxycycline Rash     CURRENT MEDICATIONS:   Current Outpatient Medications  Medication Sig Dispense Refill   Ascorbic Acid (VITAMIN C) 1000 MG tablet Take 1,000 mg by mouth daily.     buPROPion (WELLBUTRIN XL) 300 MG 24 hr tablet TAKE 1 TABLET BY MOUTH EVERY DAY 90 tablet 2   cholecalciferol (VITAMIN D3) 25 MCG (1000 UNIT) tablet Take 1,000 Units by mouth daily.     citalopram (CELEXA) 20 MG tablet TAKE 1 TABLET BY MOUTH EVERY DAY 90 tablet 1   EPINEPHrine 0.3 mg/0.3 mL IJ SOAJ injection  fexofenadine (ALLEGRA) 180 MG tablet Take 180 mg by mouth daily.     levocetirizine (XYZAL) 5 MG tablet Take a tablet 1-2 times daily during the spring/summer to keep the hives at bay in combination with your Allegra daily. 60 tablet 3   Magnesium Oxide 400 MG CAPS Take 400 mg by mouth at bedtime.     metoprolol succinate (TOPROL XL) 25 MG 24 hr tablet Take 1 tablet (25 mg total) by mouth daily. 90 tablet 3   Multiple Vitamin (MULTIVITAMIN) tablet Take 1 tablet by mouth daily.     omalizumab (XOLAIR) 150 MG/ML prefilled syringe INJECT 2 SYRINGES UNDER THE SKIN EVERY 4 WEEKS 2 mL 11   Omega-3 Fatty Acids (FISH OIL) 1000 MG CAPS Take 1,000 mg by mouth daily.      rosuvastatin (CRESTOR) 40 MG tablet TAKE 1 TABLET BY MOUTH EVERY DAY 90 tablet 2   triamcinolone ointment (KENALOG) 0.1 % Apply 1 application topically 2 (two) times daily. 80 g 1   Current Facility-Administered Medications  Medication Dose Route Frequency Provider Last Rate Last Admin   omalizumab Geoffry Paradise) injection 300 mg  300 mg Subcutaneous Q28 days Alfonse Spruce, MD   300 mg at  12/23/22 0935    REVIEW OF SYSTEMS:   [X]  denotes positive finding, [ ]  denotes negative finding Cardiac  Comments:  Chest pain or chest pressure:    Shortness of breath upon exertion:    Short of breath when lying flat:    Irregular heart rhythm:        Vascular    Pain in calf, thigh, or hip brought on by ambulation:    Pain in feet at night that wakes you up from your sleep:     Blood clot in your veins:    Leg swelling:         Pulmonary    Oxygen at home:    Productive cough:     Wheezing:         Neurologic    Sudden weakness in arms or legs:     Sudden numbness in arms or legs:     Sudden onset of difficulty speaking or slurred speech:    Temporary loss of vision in one eye:     Problems with dizziness:         Gastrointestinal    Blood in stool:     Vomited blood:         Genitourinary    Burning when urinating:     Blood in urine:        Psychiatric    Major depression:         Hematologic    Bleeding problems:    Problems with blood clotting too easily:        Skin    Rashes or ulcers:        Constitutional    Fever or chills:      PHYSICAL EXAM:   Vitals:   12/28/22 0922  BP: 129/85  Pulse: 67  Temp: 97.7 F (36.5 C)  TempSrc: Temporal  SpO2: 95%  Weight: 150 lb (68 kg)    GENERAL: The patient is a well-nourished female, in no acute distress. The vital signs are documented above. CARDIAC: There is a regular rate and rhythm.  PULMONARY: Non-labored respirations ABDOMEN: Soft and non-tender  MUSCULOSKELETAL: There are no major deformities or cyanosis. NEUROLOGIC: No focal weakness or paresthesias are detected. SKIN: There are no ulcers or rashes noted. PSYCHIATRIC: The  patient has a normal affect.  STUDIES:   I have reviewed her CT scan with the following findings: 1. Stable mild fusiform aneurysmal dilation of the ascending thoracic aorta at 4.1 cm. Recommend annual imaging followup by CTA or MRA. This recommendation follows  2010 ACCF/AHA/AATS/ACR/ASA/SCA/SCAI/SIR/STS/SVM Guidelines for the Diagnosis and Management of Patients with Thoracic Aortic Disease. Circulation. 2010; 121: U045-W098. Aortic aneurysm NOS (ICD10-I71.9) 2. Stable splenic artery aneurysm measuring up to 1.4 cm. There is been no substantive change in the size or configuration of this aneurysm dating back to at least August of 2020. 3. No evidence of abdominal aortic aneurysm. 4. Trace atherosclerotic calcifications along the aorta. Aortic Atherosclerosis (ICD10-I70.0). 5. Hepatic steatosis. 6. Additional ancillary findings as above.  MEDICAL ISSUES:   Splenic artery aneurysm: Maximum diameter is 1.4 cm.  There has been no significant change since 2020.  I will continue with surveillance imaging.  I will get a ultrasound in 2 years  Ascending aortic aneurysm: Maximum diameter is 4.1 cm.  This is being followed by Dr. Mayford Knife with cardiology    Charlena Cross, MD, FACS [facility] [phone] Pager [phone]

## 2022-12-31 NOTE — Progress Notes (Signed)
Office Visit Note  Patient: Ann Bass             Date of Birth: 04-Aug-1970           MRN: [id number]             PCP: Sheliah Hatch, MD Referring: Sheliah Hatch, MD Visit Date: 01/14/2023 Occupation: @GUAROCC @  Subjective:  Neck and lower back pain  History of Present Illness: Ann Bass is a 52 y.o. female with history of polyarthralgia, fatigue, dry mouth and dry eyes returns for follow-up visit.  She states that she continues to have pain and stiffness in her cervical spine and lower back.  She states that SI joint pain is not as uncomfortable anymore.  She has been wearing a boot because of bunionectomy which could be contributing to lower back and SI joint pain per patient.  She has had a discomfort in her knee joints off-and-on especially when she is hiking.  She has not noticed any joint swelling.  There is no recent history of plantar fasciitis, Achilles tendinitis or uveitis.  She continues to have dry mouth and dry eyes.    Activities of Daily Living:  Patient reports morning stiffness for 5 minutes.   Patient Reports nocturnal pain.  Difficulty dressing/grooming: Denies Difficulty climbing stairs: Reports Difficulty getting out of chair: Denies Difficulty using hands for taps, buttons, cutlery, and/or writing: Denies  Review of Systems  Constitutional:  Positive for fatigue.  HENT:  Negative for mouth sores and mouth dryness.   Eyes:  Positive for photophobia, redness and dryness.  Respiratory:  Positive for shortness of breath. Negative for cough and wheezing.   Cardiovascular:  Positive for palpitations. Negative for chest pain.  Gastrointestinal:  Positive for constipation. Negative for blood in stool and diarrhea.  Endocrine: Negative for increased urination.  Genitourinary:  Negative for involuntary urination.  Musculoskeletal:  Positive for joint pain, gait problem, joint pain, joint swelling and morning stiffness. Negative for myalgias,  muscle weakness, muscle tenderness and myalgias.  Skin:  Positive for hair loss. Negative for color change, rash and sensitivity to sunlight.  Allergic/Immunologic: Negative for susceptible to infections.  Neurological:  Positive for dizziness and tremors. Negative for headaches.  Hematological:  Negative for swollen glands.  Psychiatric/Behavioral:  Positive for depressed mood and sleep disturbance. The patient is nervous/anxious.     PMFS History:  Patient Active Problem List   Diagnosis Date Noted   Hyperlipidemia 05/05/2022   Fatigue 05/05/2022   Upper airway cough syndrome 07/14/2021   Chronic rhinitis 07/14/2021   Splenic artery aneurysm (HCC) 03/04/2021   Allergic contact dermatitis 08/09/2020   Rash 08/09/2020   Steatosis of liver 01/13/2019   Renal cyst, left 01/13/2019   Overweight (BMI 25.0-29.9) 01/13/2019   OSA (obstructive sleep apnea) 09/02/2017   Ascending aorta dilatation (HCC)    SOB (shortness of breath) 02/18/2017   Excessive daytime sleepiness 02/18/2017   Vitamin D deficiency 09/17/2016   Physical exam 09/13/2014   Palpitations 09/13/2014   Decreased hearing of both ears 09/13/2014   Glucose intolerance (impaired glucose tolerance) 02/22/2013   Depression 02/22/2013   Hypothyroid 02/22/2013    Past Medical History:  Diagnosis Date   Allergy    Ascending aorta dilatation (HCC)    41mm on MRI 02/2021 and 44 mm by 2D echo 07-2021 and 44mm by Chest MRA 08/2022   Depression    post-partum   Dilated aortic root (HCC)    41mm by chest  MRI 01/2020   Dyspnea    Dysrhythmia    occational   History of gestational diabetes    Hyperlipidemia    Hypertension    Hypothyroidism    Inflammatory polyps of colon (HCC)    Liver disease    OSA (obstructive sleep apnea) 09/02/2017   Mild OSA with an AHI of 6.8/h.  On CPAP   Palpitations    PACs noted on event monitor   Pre-diabetes    updated epic hx 08-23-2019 per pt she is pre-diabetic   Splenic artery aneurysm  (HCC)    12mm on MRI 02/2021    Family History  Problem Relation Age of Onset   Diabetes Mother    Heart Problems Mother    Colon cancer Father 47 - 41   Cancer Father        colon   Ankylosing spondylitis Father    Healthy Sister    Healthy Sister    Cancer Paternal Grandmother        ovarian   Healthy Daughter    Healthy Daughter    Healthy Daughter    Allergic rhinitis Neg Hx    Angioedema Neg Hx    Asthma Neg Hx    Eczema Neg Hx    Immunodeficiency Neg Hx    Urticaria Neg Hx    Rectal cancer Neg Hx    Stomach cancer Neg Hx    Esophageal cancer Neg Hx    Past Surgical History:  Procedure Laterality Date   broken leg  1982   Left Femur, traction and pin in leg   BUNIONECTOMY Right 10/23/2022   COLONOSCOPY  2018   COLONOSCOPY  11/24/2022   FOOT SURGERY Right    ROBOTIC ASSISTED LAPAROSCOPIC HYSTERECTOMY AND SALPINGECTOMY Bilateral 08/24/2019   Procedure: XI ROBOTIC ASSISTED LAPAROSCOPIC HYSTERECTOMY AND SALPINGECTOMY;  Surgeon: Silverio Lay, MD;  Location: [facility];  Service: Gynecology;  Laterality: Bilateral;   WISDOM TOOTH EXTRACTION  1998   Social History   Social History Narrative   Not on file   Immunization History  Administered Date(s) Administered   Influenza, Quadrivalent, Recombinant, Inj, Pf 04/21/2018   Influenza,inj,Quad PF,6+ Mos 05/25/2014, 01/13/2019, 04/17/2020, 05/06/2021, 05/05/2022   PFIZER Comirnaty(Gray Top)Covid-19 Tri-Sucrose Vaccine 12/12/2020   PFIZER(Purple Top)SARS-COV-2 Vaccination 10/24/2019, 11/14/2019   Tdap 09/13/2014   Unspecified SARS-COV-2 Vaccination 10/17/2019, 11/16/2019   Zoster Recombinant(Shingrix) 05/22/2022, 09/04/2022     Objective: Vital Signs: BP (!) 141/96 (BP Location: Left Arm, Patient Position: Sitting, Cuff Size: Normal)   Pulse 64   Resp 14   Ht 5\' 2"  (1.575 m)   Wt 152 lb 6.4 oz (69.1 kg)   LMP 10/27/2017   BMI 27.87 kg/m    Physical Exam Vitals and nursing note reviewed.   Constitutional:      Appearance: She is well-developed.  HENT:     Head: Normocephalic and atraumatic.  Eyes:     Conjunctiva/sclera: Conjunctivae normal.  Cardiovascular:     Rate and Rhythm: Normal rate and regular rhythm.     Heart sounds: Normal heart sounds.  Pulmonary:     Effort: Pulmonary effort is normal.     Breath sounds: Normal breath sounds.  Abdominal:     General: Bowel sounds are normal.     Palpations: Abdomen is soft.  Musculoskeletal:     Cervical back: Normal range of motion.  Lymphadenopathy:     Cervical: No cervical adenopathy.  Skin:    General: Skin is warm and dry.  Capillary Refill: Capillary refill takes less than 2 seconds.  Neurological:     Mental Status: She is alert and oriented to person, place, and time.  Psychiatric:        Behavior: Behavior normal.      Musculoskeletal Exam: Cervical spine was in good range of motion with some discomfort.  Thoracic and lumbar spine were in good range of motion.  She has some discomfort range of motion of her lumbar spine.  She has mild tenderness over bilateral SI joints.  Shoulder joints, elbow joints, wrist joints, MCPs PIPs and DIPs were in good range of motion.  She had hypermobility in most of her joints.  Hip joints and knee joints with good range of motion without any warmth swelling or effusion.  Her right foot was in a boot.  There was no tenderness over left ankle or MTPs.  CDAI Exam: CDAI Score: -- Patient Global: --; Provider Global: -- Swollen: --; Tender: -- Joint Exam 01/14/2023   No joint exam has been documented for this visit   There is currently no information documented on the homunculus. Go to the Rheumatology activity and complete the homunculus joint exam.  Investigation: No additional findings.  Imaging: DG Foot Complete Right  Result Date: 01/06/2023 Please see detailed radiograph report in office note.  DG Foot Complete Right  Result Date: 12/31/2022 Please see  detailed radiograph report in office note.   Recent Labs: Lab Results  Component Value Date   WBC 8.8 12/08/2022   HGB 15.0 12/08/2022   PLT 314.0 12/08/2022   NA 138 12/08/2022   K 4.1 12/08/2022   CL 103 12/08/2022   CO2 28 12/08/2022   GLUCOSE 98 12/08/2022   BUN 10 12/08/2022   CREATININE 0.60 12/08/2022   BILITOT 0.5 12/08/2022   ALKPHOS 51 12/08/2022   AST 22 12/08/2022   ALT 26 12/08/2022   PROT 6.7 12/08/2022   ALBUMIN 4.3 12/08/2022   CALCIUM 9.4 12/08/2022   GFRAA 122 12/22/2019   December 11, 2022 RF negative, anti-CCP negative, RNP negative Smith negative, SSA negative, SSB negative, dsDNA negative, C3-C4 normal, CK29, thyroglobulin antibody negative, thyroperoxidase antibody negative, HLA-B27 positive  05/05/22: ANA 1:80NS, 1:80 NH, 1:80 cytoplasmic, ESR 4   Speciality Comments: No specialty comments available.  Procedures:  No procedures performed Allergies: Penicillins and Doxycycline   Assessment / Plan:     Visit Diagnoses: Sicca syndrome (HCC) - ANA 1: 80 NS, NH, cytoplasmic, ENA negative, complements normal.  Lab results were discussed with the patient at length.  She gives history of fatigue, photosensitivity, dry mouth and dry eyes.  She has been on multiple medications which could be contributing to dry mouth and dry eyes.  Over-the-counter products for dry mouth and dry eyes were discussed.  I also discussed the option of pilocarpine in the future if her symptoms persist.  HLA B27 positive-she is positive for HLA-B27.  Her father has ankylosing spondylitis.  She had no synovitis on the examination.  She continues to have discomfort in her spine and SI joints.  X-rays were unremarkable of the cervical spine.  SI joint x-rays showed some osteoarthritic changes.  Patient will contact me if she develops any new symptoms.  Neck pain -she has significant discomfort in her cervical spine.  X-rays obtained of cervical spine at the last visit were unremarkable.   X-rays were reviewed with the patient.  I gave her a handout on cervical spine exercises.  She had good mobility in her  cervical spine with no restrictions.  Chronic SI joint pain - History of SI joint discomfort and morning stiffness.  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  HLA-B27 positive.  I discussed the option of MRI of the SI joints.  She would like to hold off.  She states the discomfort in her SI joint has improved.  She believes it may be coming from walking with the boot.  Chronic pain of both knees -she gives history of discomfort in her knee joints especially when she is coming down the stairs and hiking.  No warmth swelling or effusion was noted.  X-rays of bilateral knee joints obtained at the last visit were unremarkable.  X-ray findings were reviewed with the patient.  Lower extremity muscle strength exercises were advised.  All autoimmune workup negative except for positive HLA-B27 gene.  Pes planus of both feet-she is without support for advised.  S/P bunionectomy - Right October 23, 2022 by Dr. Lilian Kapur.  She has been wearing a boot.  Family history of ankylosing spondylitis-father  Hypermobility of joint-she has hypermobility in multiple joints which contributes to arthralgias.  She also has children with hypermobility.  Myofascial pain-she continues to have some generalized pain and discomfort.  Other fatigue-history of chronic fatigue.  Other medical problems are listed as follows:  Brain fog  Ascending aorta dilatation (HCC) - Followed by Dr. Mayford Knife and Dr. Myra Gianotti  Splenic artery aneurysm [facility])  Steatosis of liver  History of hyperlipidemia  History of hypothyroidism  Vitamin D deficiency  History of depression  OSA (obstructive sleep apnea)  Orders: No orders of the defined types were placed in this encounter.  No orders of the defined types were placed in this encounter.    Follow-Up Instructions: Return in  about 6 months (around 07/16/2023) for Joint pain HLA B 27 +, sicca.   Pollyann Savoy, MD  Note - This record has been created using Animal nutritionist.  Chart creation errors have been sought, but may not always  have been located. Such creation errors do not reflect on  the standard of medical care.

## 2023-01-05 ENCOUNTER — Ambulatory Visit (INDEPENDENT_AMBULATORY_CARE_PROVIDER_SITE_OTHER): Payer: 59

## 2023-01-05 ENCOUNTER — Ambulatory Visit (INDEPENDENT_AMBULATORY_CARE_PROVIDER_SITE_OTHER): Payer: 59 | Admitting: Podiatry

## 2023-01-05 DIAGNOSIS — Z9889 Other specified postprocedural states: Secondary | ICD-10-CM

## 2023-01-05 DIAGNOSIS — M2011 Hallux valgus (acquired), right foot: Secondary | ICD-10-CM

## 2023-01-05 DIAGNOSIS — M21611 Bunion of right foot: Secondary | ICD-10-CM

## 2023-01-05 NOTE — Progress Notes (Signed)
  Subjective:  Patient ID: Ann Bass, female    DOB: 04-16-1971,  MRN: [id number]  Chief Complaint  Patient presents with   Routine Post Op    POV #4 DOS 10/23/22 --- BUNION CORRECTION RIGHT FOOT WITH LAPIDUS PROCEDURE, POSSIBLE BONE CUT IN BIG TOE, BONE GRAFT FROM HEEL, SHORTENING 2ND METATARSAL. C/o sharp pain around toes and also having pain to incision site and medial aspect of right foot. She continues to use the cam boot with ambulation but patient has tried to ambulate without the boot and is having pain if she is on her foot for long period of time.    Foot Orthotics    Picking up orthotics       52 y.o. female returns for post-op check.  History confirmed with patient, she is still having some discomfort, feels okay in the boot.  She also notes tenderness around the ankle joint.  Review of Systems: Negative except as noted in the HPI. Denies N/V/F/Ch.   Objective:  There were no vitals filed for this visit. There is no height or weight on file to calculate BMI. Constitutional Well developed. Well nourished.  Vascular Foot warm and well perfused. Capillary refill normal to all digits.  Calf is soft and supple, no posterior calf or knee pain, negative Homans' sign  Neurologic Normal speech. Oriented to person, place, and time. Epicritic sensation to light touch grossly present bilaterally.  Dermatologic Incision is well-healed with minimal hypertrophy   Orthopedic: Mild edema around the first TMT, no pain to direct palpation   Multiple view plain film radiographs: Correction is maintained with unchanged alignment, there is fracture of the intercuneiform screw, there is still some visible fusion site noted Assessment:   1. Status post foot surgery   2. Hallux valgus with bunions, right    Plan:  Patient was evaluated and treated and all questions answered.  S/p foot surgery right -We reviewed her x-rays.  We discussed the breakage of the intercuneiform screw and that  due to the independent motion of the first ray this does happen sometimes.  Does not appear to be particularly painful itself or a generator of pain at this point with manipulation of the joint.  She does still have some continued bone healing happening as well which I expect is the cause of the persistent swelling and tenderness.  Most of her pain on the front of the ankle appears to be compensatory due to overloading and manipulating the way she is walking.  Expect this will improve with time and conditioning.  Compression sleeves applied today and she will utilize these, her orthotics were ready for dispensation and assessed for form fit and function.  She may gradually begin to increase her walking in a supportive shoe such as a sneaker with her orthotics gradually, we discussed the break-in period, she can still utilize the boot for longer periods of time, after next visit should be able to return to shoe gear full-time.  Return in about 6 weeks (around 02/16/2023) for surgery follow up (R foot xrays).

## 2023-01-05 NOTE — Telephone Encounter (Signed)
Called patient to request bp readings, no answer. Left detailed message per DPR asking patient to call our office to report diet changes/bp readings. Letter sent.

## 2023-01-14 ENCOUNTER — Ambulatory Visit: Payer: 59 | Attending: Rheumatology | Admitting: Rheumatology

## 2023-01-14 ENCOUNTER — Encounter: Payer: Self-pay | Admitting: Rheumatology

## 2023-01-14 ENCOUNTER — Encounter: Payer: Self-pay | Admitting: Family Medicine

## 2023-01-14 VITALS — BP 141/96 | HR 64 | Resp 14 | Ht 62.0 in | Wt 152.4 lb

## 2023-01-14 DIAGNOSIS — K760 Fatty (change of) liver, not elsewhere classified: Secondary | ICD-10-CM

## 2023-01-14 DIAGNOSIS — Z8659 Personal history of other mental and behavioral disorders: Secondary | ICD-10-CM

## 2023-01-14 DIAGNOSIS — Z9889 Other specified postprocedural states: Secondary | ICD-10-CM

## 2023-01-14 DIAGNOSIS — M2142 Flat foot [pes planus] (acquired), left foot: Secondary | ICD-10-CM

## 2023-01-14 DIAGNOSIS — M3500 Sicca syndrome, unspecified: Secondary | ICD-10-CM

## 2023-01-14 DIAGNOSIS — E559 Vitamin D deficiency, unspecified: Secondary | ICD-10-CM

## 2023-01-14 DIAGNOSIS — Z1589 Genetic susceptibility to other disease: Secondary | ICD-10-CM

## 2023-01-14 DIAGNOSIS — M2141 Flat foot [pes planus] (acquired), right foot: Secondary | ICD-10-CM

## 2023-01-14 DIAGNOSIS — M533 Sacrococcygeal disorders, not elsewhere classified: Secondary | ICD-10-CM

## 2023-01-14 DIAGNOSIS — Z8639 Personal history of other endocrine, nutritional and metabolic disease: Secondary | ICD-10-CM

## 2023-01-14 DIAGNOSIS — R768 Other specified abnormal immunological findings in serum: Secondary | ICD-10-CM

## 2023-01-14 DIAGNOSIS — M542 Cervicalgia: Secondary | ICD-10-CM | POA: Diagnosis not present

## 2023-01-14 DIAGNOSIS — R4189 Other symptoms and signs involving cognitive functions and awareness: Secondary | ICD-10-CM

## 2023-01-14 DIAGNOSIS — M25561 Pain in right knee: Secondary | ICD-10-CM

## 2023-01-14 DIAGNOSIS — G4733 Obstructive sleep apnea (adult) (pediatric): Secondary | ICD-10-CM

## 2023-01-14 DIAGNOSIS — R5383 Other fatigue: Secondary | ICD-10-CM

## 2023-01-14 DIAGNOSIS — G8929 Other chronic pain: Secondary | ICD-10-CM

## 2023-01-14 DIAGNOSIS — M7918 Myalgia, other site: Secondary | ICD-10-CM

## 2023-01-14 DIAGNOSIS — I728 Aneurysm of other specified arteries: Secondary | ICD-10-CM

## 2023-01-14 DIAGNOSIS — Z8269 Family history of other diseases of the musculoskeletal system and connective tissue: Secondary | ICD-10-CM

## 2023-01-14 DIAGNOSIS — M25562 Pain in left knee: Secondary | ICD-10-CM

## 2023-01-14 DIAGNOSIS — M249 Joint derangement, unspecified: Secondary | ICD-10-CM

## 2023-01-14 DIAGNOSIS — I77810 Thoracic aortic ectasia: Secondary | ICD-10-CM

## 2023-01-14 NOTE — Patient Instructions (Signed)
 Cervical Strain and Sprain Rehab Ask your health care provider which exercises are safe for you. Do exercises exactly as told by your health care provider and adjust them as directed. It is normal to feel mild stretching, pulling, tightness, or discomfort as you do these exercises. Stop right away if you feel sudden pain or your pain gets worse. Do not begin these exercises until told by your health care provider. Stretching and range-of-motion exercises Cervical side bending  Using good posture, sit on a stable chair or stand up. Without moving your shoulders, slowly tilt your left / right ear to your shoulder until you feel a stretch in the neck muscles on the opposite side. You should be looking straight ahead. Hold for __________ seconds. Repeat with the other side of your neck. Repeat __________ times. Complete this exercise __________ times a day. Cervical rotation  Using good posture, sit on a stable chair or stand up. Slowly turn your head to the side as if you are looking over your left / right shoulder. Keep your eyes level with the ground. Stop when you feel a stretch along the side and the back of your neck. Hold for __________ seconds. Repeat this by turning to your other side. Repeat __________ times. Complete this exercise __________ times a day. Thoracic extension and pectoral stretch  Roll a towel or a small blanket so it is about 4 inches (10 cm) in diameter. Lie down on your back on a firm surface. Put the towel in the middle of your back across your spine. It should not be under your shoulder blades. Put your hands behind your head and let your elbows fall out to your sides. Hold for __________ seconds. Repeat __________ times. Complete this exercise __________ times a day. Strengthening exercises Upper cervical flexion  Lie on your back with a thin pillow behind your head or a small, rolled-up towel under your neck. Gently tuck your chin toward your chest and nod  your head down to look toward your feet. Do not lift your head off the pillow. Hold for __________ seconds. Release the tension slowly. Relax your neck muscles completely before you repeat this exercise. Repeat __________ times. Complete this exercise __________ times a day. Cervical extension  Stand about 6 inches (15 cm) away from a wall, with your back facing the wall. Place a soft object, about 6-8 inches (15-20 cm) in diameter, between the back of your head and the wall. A soft object could be a small pillow, a ball, or a folded towel. Gently tilt your head back and press into the soft object. Keep your jaw and forehead relaxed. Hold for __________ seconds. Release the tension slowly. Relax your neck muscles completely before you repeat this exercise. Repeat __________ times. Complete this exercise __________ times a day. Posture and body mechanics Body mechanics refer to the movements and positions of your body while you do your daily activities. Posture is part of body mechanics. Good posture and healthy body mechanics can help to relieve stress in your body's tissues and joints. Good posture means that your spine is in its natural S-curve position (your spine is neutral), your shoulders are pulled back slightly, and your head is not tipped forward. The following are general guidelines for using improved posture and body mechanics in your everyday activities. Sitting  When sitting, keep your spine neutral and keep your feet flat on the floor. Use a footrest, if needed, and keep your thighs parallel to the floor. Avoid rounding  your shoulders. Avoid tilting your head forward. When working at a desk or a computer, keep your desk at a height where your hands are slightly lower than your elbows. Slide your chair under your desk so you are close enough to maintain good posture. When working at a computer, place your monitor at a height where you are looking straight ahead and you do not have to  tilt your head forward or downward to look at the screen. Standing  When standing, keep your spine neutral and keep your feet about hip-width apart. Keep a slight bend in your knees. Your ears, shoulders, and hips should line up. When you do a task in which you stand in one place for a long time, place one foot up on a stable object that is 2-4 inches (5-10 cm) high, such as a footstool. This helps keep your spine neutral. Resting When lying down and resting, avoid positions that are most painful for you. Try to support your neck in a neutral position. You can use a contour pillow or a small rolled-up towel. Your pillow should support your neck but not push on it. This information is not intended to replace advice given to you by your health care provider. Make sure you discuss any questions you have with your health care provider. Document Revised: 09/07/2022 Document Reviewed: 11/24/2021 Elsevier Patient Education  2024 [employer] Low Back Sprain or Strain Rehab Ask your health care provider which exercises are safe for you. Do exercises exactly as told by your health care provider and adjust them as directed. It is normal to feel mild stretching, pulling, tightness, or discomfort as you do these exercises. Stop right away if you feel sudden pain or your pain gets worse. Do not begin these exercises until told by your health care provider. Stretching and range-of-motion exercises These exercises warm up your muscles and joints and improve the movement and flexibility of your back. These exercises also help to relieve pain, numbness, and tingling. Lumbar rotation  Lie on your back on a firm bed or the floor with your knees bent. Straighten your arms out to your sides so each arm forms a 90-degree angle (right angle) with a side of your body. Slowly move (rotate) both of your knees to one side of your body until you feel a stretch in your lower back (lumbar). Try not to let your shoulders lift  off the floor. Hold this position for __________ seconds. Tense your abdominal muscles and slowly move your knees back to the starting position. Repeat this exercise on the other side of your body. Repeat __________ times. Complete this exercise __________ times a day. Single knee to chest  Lie on your back on a firm bed or the floor with both legs straight. Bend one of your knees. Use your hands to move your knee up toward your chest until you feel a gentle stretch in your lower back and buttock. Hold your leg in this position by holding on to the front of your knee. Keep your other leg as straight as possible. Hold this position for __________ seconds. Slowly return to the starting position. Repeat with your other leg. Repeat __________ times. Complete this exercise __________ times a day. Prone extension on elbows  Lie on your abdomen on a firm bed or the floor (prone position). Prop yourself up on your elbows. Use your arms to help lift your chest up until you feel a gentle stretch in your abdomen and your lower back.  This will place some of your body weight on your elbows. If this is uncomfortable, try stacking pillows under your chest. Your hips should stay down, against the surface that you are lying on. Keep your hip and back muscles relaxed. Hold this position for __________ seconds. Slowly relax your upper body and return to the starting position. Repeat __________ times. Complete this exercise __________ times a day. Strengthening exercises These exercises build strength and endurance in your back. Endurance is the ability to use your muscles for a long time, even after they get tired. Pelvic tilt This exercise strengthens the muscles that lie deep in the abdomen. Lie on your back on a firm bed or the floor with your legs extended. Bend your knees so they are pointing toward the ceiling and your feet are flat on the floor. Tighten your lower abdominal muscles to press your  lower back against the floor. This motion will tilt your pelvis so your tailbone points up toward the ceiling instead of pointing to your feet or the floor. To help with this exercise, you may place a small towel under your lower back and try to push your back into the towel. Hold this position for __________ seconds. Let your muscles relax completely before you repeat this exercise. Repeat __________ times. Complete this exercise __________ times a day. Alternating arm and leg raises  Get on your hands and knees on a firm surface. If you are on a hard floor, you may want to use padding, such as an exercise mat, to cushion your knees. Line up your arms and legs. Your hands should be directly below your shoulders, and your knees should be directly below your hips. Lift your left leg behind you. At the same time, raise your right arm and straighten it in front of you. Do not lift your leg higher than your hip. Do not lift your arm higher than your shoulder. Keep your abdominal and back muscles tight. Keep your hips facing the ground. Do not arch your back. Keep your balance carefully, and do not hold your breath. Hold this position for __________ seconds. Slowly return to the starting position. Repeat with your right leg and your left arm. Repeat __________ times. Complete this exercise __________ times a day. Abdominal set with straight leg raise  Lie on your back on a firm bed or the floor. Bend one of your knees and keep your other leg straight. Tense your abdominal muscles and lift your straight leg up, 4-6 inches (10-15 cm) off the ground. Keep your abdominal muscles tight and hold this position for __________ seconds. Do not hold your breath. Do not arch your back. Keep it flat against the ground. Keep your abdominal muscles tense as you slowly lower your leg back to the starting position. Repeat with your other leg. Repeat __________ times. Complete this exercise __________ times a  day. Single leg lower with bent knees Lie on your back on a firm bed or the floor. Tense your abdominal muscles and lift your feet off the floor, one foot at a time, so your knees and hips are bent in 90-degree angles (right angles). Your knees should be over your hips and your lower legs should be parallel to the floor. Keeping your abdominal muscles tense and your knee bent, slowly lower one of your legs so your toe touches the ground. Lift your leg back up to return to the starting position. Do not hold your breath. Do not let your back arch. Keep  your back flat against the ground. Repeat with your other leg. Repeat __________ times. Complete this exercise __________ times a day. Posture and body mechanics Good posture and healthy body mechanics can help to relieve stress in your body's tissues and joints. Body mechanics refers to the movements and positions of your body while you do your daily activities. Posture is part of body mechanics. Good posture means: Your spine is in its natural S-curve position (neutral). Your shoulders are pulled back slightly. Your head is not tipped forward (neutral). Follow these guidelines to improve your posture and body mechanics in your everyday activities. Standing  When standing, keep your spine neutral and your feet about hip-width apart. Keep a slight bend in your knees. Your ears, shoulders, and hips should line up. When you do a task in which you stand in one place for a long time, place one foot up on a stable object that is 2-4 inches (5-10 cm) high, such as a footstool. This helps keep your spine neutral. Sitting  When sitting, keep your spine neutral and keep your feet flat on the floor. Use a footrest, if necessary, and keep your thighs parallel to the floor. Avoid rounding your shoulders, and avoid tilting your head forward. When working at a desk or a computer, keep your desk at a height where your hands are slightly lower than your elbows.  Slide your chair under your desk so you are close enough to maintain good posture. When working at a computer, place your monitor at a height where you are looking straight ahead and you do not have to tilt your head forward or downward to look at the screen. Resting When lying down and resting, avoid positions that are most painful for you. If you have pain with activities such as sitting, bending, stooping, or squatting, lie in a position in which your body does not bend very much. For example, avoid curling up on your side with your arms and knees near your chest (fetal position). If you have pain with activities such as standing for a long time or reaching with your arms, lie with your spine in a neutral position and bend your knees slightly. Try the following positions: Lying on your side with a pillow between your knees. Lying on your back with a pillow under your knees. Lifting  When lifting objects, keep your feet at least shoulder-width apart and tighten your abdominal muscles. Bend your knees and hips and keep your spine neutral. It is important to lift using the strength of your legs, not your back. Do not lock your knees straight out. Always ask for help to lift heavy or awkward objects. This information is not intended to replace advice given to you by your health care provider. Make sure you discuss any questions you have with your health care provider. Document Revised: 09/07/2022 Document Reviewed: 07/22/2020 Elsevier Patient Education  2024 [employer].

## 2023-01-20 ENCOUNTER — Ambulatory Visit (INDEPENDENT_AMBULATORY_CARE_PROVIDER_SITE_OTHER): Payer: 59

## 2023-01-20 DIAGNOSIS — L501 Idiopathic urticaria: Secondary | ICD-10-CM

## 2023-01-20 LAB — HM MAMMOGRAPHY

## 2023-01-27 ENCOUNTER — Encounter: Payer: Self-pay | Admitting: Podiatry

## 2023-01-27 ENCOUNTER — Encounter: Payer: Self-pay | Admitting: Family Medicine

## 2023-01-27 ENCOUNTER — Ambulatory Visit (INDEPENDENT_AMBULATORY_CARE_PROVIDER_SITE_OTHER): Payer: 59 | Admitting: Family Medicine

## 2023-01-27 VITALS — BP 120/82 | HR 67 | Temp 97.8°F | Resp 17 | Ht 62.0 in | Wt 150.2 lb

## 2023-01-27 DIAGNOSIS — Z23 Encounter for immunization: Secondary | ICD-10-CM | POA: Diagnosis not present

## 2023-01-27 DIAGNOSIS — F419 Anxiety disorder, unspecified: Secondary | ICD-10-CM | POA: Insufficient documentation

## 2023-01-27 MED ORDER — DIAZEPAM 2 MG PO TABS: 2.0000 mg | ORAL_TABLET | Freq: Three times a day (TID) | ORAL | 0 refills | Status: DC | PRN

## 2023-01-27 MED ORDER — FLUOXETINE HCL 40 MG PO CAPS: 40.0000 mg | ORAL_CAPSULE | Freq: Every day | ORAL | 3 refills | Status: DC

## 2023-01-27 NOTE — Assessment & Plan Note (Signed)
Deteriorated.  Pt reports increased dose of Citalopram is causing drowsiness.  Will switch to Fluoxetine.  Continue Wellbutrin.  Given that she has to fly to [place] to make a decision regarding dad's life support, will add low dose Valium to use prior to flights and as needed for panicked moments.  Pt expressed understanding and is in agreement w/ plan.

## 2023-01-27 NOTE — Patient Instructions (Addendum)
Follow up in 4-6 weeks to recheck mood (or whenever you get back) STOP the Citalopram START the Fluoxetine daily CONTINUE the Bupropion daily USE the Diazepam (Valium) as needed for panicked moments.  Start w/ 1/2 tab and use a whole tab if needed Call with any questions or concerns Hang in there!!!

## 2023-01-27 NOTE — Progress Notes (Signed)
   Subjective:    Patient ID: Ann Bass, female    DOB: Aug 31, 1970, 52 y.o.   MRN: [id number]  HPI Anxiety- ongoing issue for pt.  Currently on Wellbutrin 300mg  daily and Citalopram 20mg  daily.  Pt reports she is under considerable stress and having a hard time controlling anxiety.  She messaged ~2 weeks ago and we increased her Citalopram to 40mg  daily.  Pt has an upcoming flight and is 'super terrified' of flying.   Review of Systems For ROS see HPI     Objective:   Physical Exam Vitals reviewed.  Constitutional:      General: She is not in acute distress.    Appearance: Normal appearance. She is not ill-appearing.  HENT:     Head: Normocephalic and atraumatic.  Eyes:     Extraocular Movements: Extraocular movements intact.     Conjunctiva/sclera: Conjunctivae normal.  Skin:    General: Skin is warm and dry.  Neurological:     General: No focal deficit present.     Mental Status: She is alert and oriented to person, place, and time.  Psychiatric:        Behavior: Behavior normal.        Thought Content: Thought content normal.     Comments: Tearful, anxious, overwhelmed           Assessment & Plan:

## 2023-02-16 ENCOUNTER — Ambulatory Visit (INDEPENDENT_AMBULATORY_CARE_PROVIDER_SITE_OTHER): Payer: 59

## 2023-02-16 ENCOUNTER — Encounter: Payer: Self-pay | Admitting: Podiatry

## 2023-02-16 ENCOUNTER — Ambulatory Visit (INDEPENDENT_AMBULATORY_CARE_PROVIDER_SITE_OTHER): Payer: 59 | Admitting: Podiatry

## 2023-02-16 DIAGNOSIS — Z9889 Other specified postprocedural states: Secondary | ICD-10-CM | POA: Diagnosis not present

## 2023-02-16 DIAGNOSIS — M21611 Bunion of right foot: Secondary | ICD-10-CM

## 2023-02-16 DIAGNOSIS — M960 Pseudarthrosis after fusion or arthrodesis: Secondary | ICD-10-CM | POA: Diagnosis not present

## 2023-02-16 DIAGNOSIS — M2011 Hallux valgus (acquired), right foot: Secondary | ICD-10-CM | POA: Diagnosis not present

## 2023-02-16 NOTE — Progress Notes (Signed)
  Subjective:  Patient ID: Ann Bass, female    DOB: 1970/06/08,  MRN: [id number]  Chief Complaint  Patient presents with   Routine Post Op    Right foot post op visit, reports some discomfort and swelling but doing well in regular shoes      52 y.o. female returns for post-op check.  History confirmed with patient, she is still having some some swelling and pain she is able to walk in a regular shoe short distances but has to use the boot for longer periods of time  Review of Systems: Negative except as noted in the HPI. Denies N/V/F/Ch.   Objective:  There were no vitals filed for this visit. There is no height or weight on file to calculate BMI. Constitutional Well developed. Well nourished.  Vascular Foot warm and well perfused. Capillary refill normal to all digits.  Calf is soft and supple, no posterior calf or knee pain, negative Homans' sign  Neurologic Normal speech. Oriented to person, place, and time. Epicritic sensation to light touch grossly present bilaterally.  Dermatologic Incision is well-healed with moderate hypertrophy   Orthopedic: Mild edema around the first TMT, no pain to direct palpation   Multiple view plain film radiographs: Correction is maintained with unchanged alignment, still visible fusion site especially centrally, no loss of correction, intercuneiform screw remains fractured, no fracture of plates but does show some lucency around the implant Assessment:   1. Hallux valgus with bunions, right   2. 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  S/p foot surgery right -We reviewed her x-rays.  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  She may continue to use regular shoe gear and short time periods otherwise we will use a surgical shoe which was dispensed today.  We discussed this may take another  few months for it to fully heal I would like to begin noninvasive bone stimulation and referral was placed for this can accelerate her bony healing and prevent any loss of correction or further complication of hardware.  We discussed if any of this develops or her symptoms clinically worsen or do not improve by 6 months that surgical revision may be necessary although unlikely.  I will see her back in 2 months for new radiographs.  She should use the bone stimulator daily when she receives it.  Return in about 2 months (around 04/22/2023) for R foot bunion sx follow up  (new XRs).

## 2023-02-17 ENCOUNTER — Ambulatory Visit: Payer: 59 | Admitting: *Deleted

## 2023-02-17 DIAGNOSIS — L501 Idiopathic urticaria: Secondary | ICD-10-CM

## 2023-03-02 ENCOUNTER — Ambulatory Visit: Payer: 59 | Admitting: Family Medicine

## 2023-03-02 VITALS — BP 118/72 | HR 63 | Temp 98.0°F | Ht 62.0 in | Wt 152.3 lb

## 2023-03-02 DIAGNOSIS — F341 Dysthymic disorder: Secondary | ICD-10-CM | POA: Diagnosis not present

## 2023-03-02 MED ORDER — FLUOXETINE HCL 20 MG PO TABS: 20.0000 mg | ORAL_TABLET | Freq: Every day | ORAL | 3 refills | Status: DC

## 2023-03-02 NOTE — Patient Instructions (Signed)
Follow up in 6 weeks to recheck mood- sooner if needed INCREASE the Fluoxetine to 60mg  daily- 1 of the 40mg  and 1of the 20mg  Consider starting counseling to help process some of these feelings- you deserve it! Call with any questions or concerns Stay Safe!  Stay Healthy! Hang in there!!!

## 2023-03-02 NOTE — Assessment & Plan Note (Signed)
Ongoing issue for pt.  Not much improvement in mood since switching to Fluoxetine but she has had an incredibly difficult time recently- father passed the day of our last appt.  Children are struggling w/ mental health which is hard for her.  Will increase Prozac to 60mg  daily and monitor for improvement.  Pt expressed understanding and is in agreement w/ plan.

## 2023-03-02 NOTE — Progress Notes (Signed)
   Subjective:    Patient ID: Ann Bass, female    DOB: Oct 08, 1970, 52 y.o.   MRN: [id number]  HPI Depression- at last visit we switched the Citalopram to Fluoxetine 40mg  daily.  Also on Wellbutrin 300mg  daily.  Pt is not sure that switching medication has made a difference.  Mood sxs are not worse but not necessarily better.  Father passed away after last visit and she spent 2 weeks in [place].  2 of her 3 children are struggling w/ learning issues and/or mental health.   Review of Systems For ROS see HPI     Objective:   Physical Exam Vitals reviewed.  Constitutional:      General: She is not in acute distress.    Appearance: Normal appearance. She is not ill-appearing.  HENT:     Head: Normocephalic and atraumatic.  Skin:    General: Skin is warm and dry.  Neurological:     General: No focal deficit present.     Mental Status: She is alert and oriented to person, place, and time.  Psychiatric:        Mood and Affect: Mood normal.        Behavior: Behavior normal.        Thought Content: Thought content normal.           Assessment & Plan:

## 2023-03-15 ENCOUNTER — Other Ambulatory Visit: Payer: Self-pay | Admitting: Family Medicine

## 2023-03-17 ENCOUNTER — Ambulatory Visit (INDEPENDENT_AMBULATORY_CARE_PROVIDER_SITE_OTHER): Payer: 59 | Admitting: *Deleted

## 2023-03-17 DIAGNOSIS — L501 Idiopathic urticaria: Secondary | ICD-10-CM | POA: Diagnosis not present

## 2023-03-28 ENCOUNTER — Other Ambulatory Visit: Payer: Self-pay | Admitting: Family Medicine

## 2023-04-20 ENCOUNTER — Encounter: Payer: Self-pay | Admitting: Podiatry

## 2023-04-20 ENCOUNTER — Ambulatory Visit (INDEPENDENT_AMBULATORY_CARE_PROVIDER_SITE_OTHER): Payer: 59

## 2023-04-20 ENCOUNTER — Ambulatory Visit (INDEPENDENT_AMBULATORY_CARE_PROVIDER_SITE_OTHER): Payer: 59 | Admitting: Podiatry

## 2023-04-20 DIAGNOSIS — M960 Pseudarthrosis after fusion or arthrodesis: Secondary | ICD-10-CM | POA: Diagnosis not present

## 2023-04-20 DIAGNOSIS — M2011 Hallux valgus (acquired), right foot: Secondary | ICD-10-CM | POA: Diagnosis not present

## 2023-04-20 DIAGNOSIS — M779 Enthesopathy, unspecified: Secondary | ICD-10-CM | POA: Diagnosis not present

## 2023-04-20 NOTE — Progress Notes (Signed)
  Subjective:  Patient ID: Ann Bass, female    DOB: 06/05/1970,  MRN: [id number]  Chief Complaint  Patient presents with   Routine Post Op    PATIENT STATES THAT SHE DOESN'T FEEL LIKE THE PAIN IS GETTING ANY BETTER , IT ALMOST FEELS LIKE IT IS GETTING WORST BUT SHE HAS NOT BEEN WEARING THE BOOT .  PATIENT STATES IF IT GETS REALLY BAD SHE WILL TAKE MEDICATION FOR PAIN      52 y.o. female returns for post-op check.  History confirmed with patient, she returns for follow-up today she has been using the bone stimulator consistently for 57 days and is doing 8 hours a day on average  Review of Systems: Negative except as noted in the HPI. Denies N/V/F/Ch.   Objective:  There were no vitals filed for this visit. There is no height or weight on file to calculate BMI. Constitutional Well developed. Well nourished.  Vascular Foot warm and well perfused. Capillary refill normal to all digits.  Calf is soft and supple, no posterior calf or knee pain, negative Homans' sign  Neurologic Normal speech. Oriented to person, place, and time. Epicritic sensation to light touch grossly present bilaterally.  Dermatologic Incision is well-healed with keloid formation   Orthopedic: Still has moderate edema around the first TMT some plantar arch pain   Multiple view plain film radiographs: Correction is maintained with unchanged alignment, still visible fusion site especially centrally, no loss of correction, intercuneiform screw remains fractured, no fracture of plates but does show some lucency around the implant, this does not seem to have increased or has increased loss of correction since last views taken at previous visit on 02/16/2023 Assessment:   1. Acquired hallux valgus, right   2. 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  S/p foot surgery right -Still has not had much progress even despite consistent and adequate use of  bone marrow stimulator for 2 months now.  I recommended a CT scan to evaluate the percentage of fusion.  We discussed that if there is minimal fusion less than 50% that surgical revision would be recommended.  If there is near full fusion then removal of the hardware at some point may be beneficial and we will continue to use the bone stimulator.  She is an upcoming trip planned out of the country, she will likely use her walking boot for this time.  I will let her know with the CT results if we need to continue the bone marrow stimulator or not prior to her trip.  Return for after CT to review.

## 2023-04-20 NOTE — Patient Instructions (Signed)
Call [facility] to schedule your CT at the below locations.  Please allow at least 1 business day after your visit to process the referral.  It may take longer depending on approval from insurance.  Please let me know if you have issues or problems scheduling the CT   [facility] [phone] [address] [place],  [address]  [facility] [phone] [address] [place],  [address]  

## 2023-04-21 ENCOUNTER — Ambulatory Visit (INDEPENDENT_AMBULATORY_CARE_PROVIDER_SITE_OTHER): Payer: 59 | Admitting: Family Medicine

## 2023-04-21 ENCOUNTER — Encounter: Payer: Self-pay | Admitting: Family Medicine

## 2023-04-21 VITALS — BP 142/78 | HR 70 | Temp 98.0°F | Ht 62.0 in | Wt 158.4 lb

## 2023-04-21 DIAGNOSIS — L409 Psoriasis, unspecified: Secondary | ICD-10-CM | POA: Diagnosis not present

## 2023-04-21 DIAGNOSIS — F341 Dysthymic disorder: Secondary | ICD-10-CM | POA: Diagnosis not present

## 2023-04-21 MED ORDER — DIAZEPAM 2 MG PO TABS: 2.0000 mg | ORAL_TABLET | Freq: Three times a day (TID) | ORAL | 0 refills | Status: AC | PRN

## 2023-04-21 MED ORDER — CLOBETASOL PROPIONATE 0.05 % EX SOLN: 1.0000 | Freq: Two times a day (BID) | CUTANEOUS | 0 refills | Status: AC

## 2023-04-21 MED ORDER — FLUOXETINE HCL 20 MG PO TABS: 20.0000 mg | ORAL_TABLET | Freq: Every day | ORAL | Status: DC

## 2023-04-21 NOTE — Progress Notes (Signed)
   Subjective:    Patient ID: Ann Bass, female    DOB: 03/02/1971, 52 y.o.   MRN: [id number]  HPI Depression- at last visit we increased Fluoxetine to 60mg  daily.  Pt feels this has been helpful.  Has to fly back on 12/17 to visit family and settle dad's estate.  Pt reports sleeping well after stopping Wellbutrin.  Thoughts are more scattered since stopping Wellbutrin but she wants to remain off meds at this time.  Still dealing w/ broken R foot s/p failed bunion surgery.    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  Area is very itchy.  Has had a bx previously and it was dx'd as psoriasis and benign.     Review of Systems For ROS see HPI     Objective:   Physical Exam Vitals reviewed.  Constitutional:      General: She is not in acute distress.    Appearance: Normal appearance. She is not ill-appearing.  HENT:     Head: Normocephalic and atraumatic.  Skin:    General: Skin is warm and dry.     Findings: Rash (thickened plaques on L anterior scalp just posterior to hairline) present.  Neurological:     General: No focal deficit present.     Mental Status: She is alert and oriented to person, place, and time.  Psychiatric:        Mood and Affect: Mood normal.        Behavior: Behavior normal.        Thought Content: Thought content normal.           Assessment & Plan:

## 2023-04-21 NOTE — Patient Instructions (Signed)
Follow up as needed or as scheduled Continue the Fluoxetine 60mg  daily Keep up the good work on healthy diet and regular exercise- you look great! Call with any questions or concerns Stay Safe!  Stay Healthy! ENJOY YOUR TRIP!!!

## 2023-04-22 ENCOUNTER — Encounter: Payer: Self-pay | Admitting: Family Medicine

## 2023-04-22 ENCOUNTER — Ambulatory Visit: Payer: 59 | Admitting: *Deleted

## 2023-04-22 DIAGNOSIS — L501 Idiopathic urticaria: Secondary | ICD-10-CM | POA: Diagnosis not present

## 2023-04-23 ENCOUNTER — Ambulatory Visit
Admission: RE | Admit: 2023-04-23 | Discharge: 2023-04-23 | Disposition: A | Discharge status: Home / Self Care | Payer: 59 | Source: Ambulatory Visit | Attending: Podiatry | Admitting: Podiatry

## 2023-04-23 DIAGNOSIS — M960 Pseudarthrosis after fusion or arthrodesis: Secondary | ICD-10-CM

## 2023-04-24 ENCOUNTER — Encounter: Payer: Self-pay | Admitting: Podiatry

## 2023-04-25 DIAGNOSIS — L409 Psoriasis, unspecified: Secondary | ICD-10-CM | POA: Insufficient documentation

## 2023-04-25 NOTE — Assessment & Plan Note (Signed)
New to provider, ongoing for pt.  Has previously seen Derm.  Will start steroid scalp solution to help w/ current sxs.  Pt expressed understanding and is in agreement w/ plan.

## 2023-04-25 NOTE — Assessment & Plan Note (Signed)
Much improved w/ increased dose of fluoxetine and since stopping Wellbutrin.  Now that she is not on Wellbutrin she is sleeping better and finds that this is extremely helpful in managing her mood.  Will provide Valium for long flight to [place], but otherwise, no med changes.  Will continue to follow.

## 2023-04-30 NOTE — Telephone Encounter (Signed)
Called patient and set her daughters up for appts.

## 2023-05-26 ENCOUNTER — Other Ambulatory Visit: Payer: Self-pay | Admitting: Family Medicine

## 2023-06-01 ENCOUNTER — Ambulatory Visit (INDEPENDENT_AMBULATORY_CARE_PROVIDER_SITE_OTHER): Payer: 59 | Admitting: *Deleted

## 2023-06-01 DIAGNOSIS — L501 Idiopathic urticaria: Secondary | ICD-10-CM | POA: Diagnosis not present

## 2023-06-10 ENCOUNTER — Ambulatory Visit: Payer: 59 | Admitting: Family Medicine

## 2023-06-10 VITALS — BP 108/70 | HR 63 | Temp 98.1°F | Ht 62.0 in | Wt 156.4 lb

## 2023-06-10 DIAGNOSIS — R051 Acute cough: Secondary | ICD-10-CM | POA: Diagnosis not present

## 2023-06-10 LAB — POC COVID19 BINAXNOW: SARS Coronavirus 2 Ag: NEGATIVE

## 2023-06-10 MED ORDER — PROMETHAZINE-DM 6.25-15 MG/5ML PO SYRP: 5.0000 mL | ORAL_SOLUTION | Freq: Four times a day (QID) | ORAL | 0 refills | Status: DC | PRN

## 2023-06-10 MED ORDER — AZITHROMYCIN 250 MG PO TABS: ORAL_TABLET | ORAL | 0 refills | Status: DC

## 2023-06-10 NOTE — Progress Notes (Signed)
   Subjective:    Patient ID: Ann Bass, female    DOB: [birth year], 53 y.o.   MRN: [id number]  HPI Sick- pt reports she got back from [place] on 1/8 and developed diarrhea x3 days.  Diarrhea has since resolved.  Developed cough and congestion on Monday.  No fever.  + sinus pressure.  + HA.  No ear pain.  Cough is productive.  Denies body aches.  + sick contacts.   Review of Systems For ROS see HPI     Objective:   Physical Exam Vitals reviewed.  Constitutional:      General: She is not in acute distress.    Appearance: Normal appearance. She is well-developed. She is not ill-appearing.  HENT:     Head: Normocephalic and atraumatic.     Right Ear: Tympanic membrane and ear canal normal.     Left Ear: Tympanic membrane and ear canal normal.     Nose: Congestion present. No rhinorrhea.     Comments: No TTP over frontal/maxillary sinuses Eyes:     Conjunctiva/sclera: Conjunctivae normal.     Pupils: Pupils are equal, round, and reactive to light.  Cardiovascular:     Rate and Rhythm: Normal rate and regular rhythm.     Heart sounds: Normal heart sounds. No murmur heard. Pulmonary:     Effort: Pulmonary effort is normal. No respiratory distress.     Breath sounds: No wheezing or rhonchi.     Comments: Coarse BS throughout Musculoskeletal:     Cervical back: Normal range of motion and neck supple.  Lymphadenopathy:     Cervical: No cervical adenopathy.  Skin:    General: Skin is warm and dry.  Neurological:     General: No focal deficit present.     Mental Status: She is alert and oriented to person, place, and time.  Psychiatric:        Mood and Affect: Mood normal.        Behavior: Behavior normal.        Thought Content: Thought content normal.           Assessment & Plan:  Acute cough- new.  Pt recently returned from [place] and has known sick contacts (who were also in [place]).  No obvious PNA on PE but coarse BS and hacking cough.  Start Zpack.  Cough meds prn.   Reviewed supportive care and red flags that should prompt return.  Pt expressed understanding and is in agreement w/ plan.

## 2023-06-10 NOTE — Patient Instructions (Signed)
Schedule your complete physical in 6 months START the Zpack as directed- 2 today, and then 1 daily USE the cough syrup as needed Drink LOTS of fluids REST! Call with any questions or concerns Hang in there!!

## 2023-06-17 ENCOUNTER — Encounter: Payer: Self-pay | Admitting: Podiatry

## 2023-06-17 ENCOUNTER — Ambulatory Visit (INDEPENDENT_AMBULATORY_CARE_PROVIDER_SITE_OTHER): Payer: 59 | Admitting: Podiatry

## 2023-06-17 ENCOUNTER — Telehealth: Payer: Self-pay | Admitting: Urology

## 2023-06-17 VITALS — Ht 62.0 in | Wt 156.0 lb

## 2023-06-17 DIAGNOSIS — M2011 Hallux valgus (acquired), right foot: Secondary | ICD-10-CM

## 2023-06-17 DIAGNOSIS — M960 Pseudarthrosis after fusion or arthrodesis: Secondary | ICD-10-CM

## 2023-06-17 NOTE — Telephone Encounter (Signed)
LM for pt to call back when she is ready to schedule sx with Dr Lilian Kapur.

## 2023-06-17 NOTE — Progress Notes (Signed)
Subjective:  Patient ID: Ann Bass, female    DOB: 12-09-1970,  MRN: [id number]  Chief Complaint  Patient presents with   Results    She is here to go over CT results.       53 y.o. female returns for post-op check.  History confirmed with patient, she returns for follow-up today she has been using the bone stimulator consistently still and completed the CT scan she returned from a trip from [place]  Review of Systems: Negative except as noted in the HPI. Denies N/V/F/Ch.   Objective:  There were no vitals filed for this visit. Body mass index is 28.53 kg/m. Constitutional Well developed. Well nourished.  Vascular Foot warm and well perfused. Capillary refill normal to all digits.  Calf is soft and supple, no posterior calf or knee pain, negative Homans' sign  Neurologic Normal speech. Oriented to person, place, and time. Epicritic sensation to light touch grossly present bilaterally.  Dermatologic Incision is well-healed with keloid formation, does have appeared to have thinned out   Orthopedic: Still has moderate edema around the first TMT some plantar arch pain   Multiple view plain film radiographs: Correction is maintained with unchanged alignment, still visible fusion site especially centrally, no loss of correction, intercuneiform screw remains fractured, no fracture of plates but does show some lucency around the implant, this does not seem to have increased or has increased loss of correction since last views taken at previous visit on 02/16/2023  Study Result  Narrative & Impression  CLINICAL DATA:  Bunion surgery 10/23/2022.  Pain and swelling.   EXAM: CT OF THE RIGHT FOOT WITHOUT CONTRAST   TECHNIQUE: Multidetector CT imaging of the right foot was performed according to the standard protocol. Multiplanar CT image reconstructions were also generated.   RADIATION DOSE REDUCTION: This exam was performed according to the departmental dose-optimization program  which includes automated exposure control, adjustment of the mA and/or kV according to patient size and/or use of iterative reconstruction technique.   COMPARISON:  Foot x-ray 04/21/2023   FINDINGS: Bones/Joint/Cartilage   No fracture or dislocation. Normal alignment. No joint effusion.   First TMT arthrodesis transfixed with a dorsal and medial side plate with interlocking screws. Lucency around the screws as can be seen with loosening or infection. Fracture of a obliquely oriented screw transfixing the medial sideplate as it enters the middle cuneiform. Majority of the joint space persists with only a small areas of bridging bone across the joint.   Mild osteoarthritis of the first MTP joint. Moderate first MTP joint effusion.   Ligaments   Ligaments are suboptimally evaluated by CT.   Muscles and Tendons Muscles are normal. No muscle atrophy. No intramuscular fluid collection or hematoma. Flexor, extensor, peroneal and Achilles tendons are intact.   Soft tissue No fluid collection or hematoma.  No soft tissue mass.   IMPRESSION: 1. First TMT arthrodesis transfixed with a dorsal and medial side plate with interlocking screws. Lucency around the screws as can be seen with loosening or infection. Fracture of a obliquely oriented screw transfixing the medial sideplate as it enters the middle cuneiform. Majority of the joint space persists with only a small area of bridging bone across the joint. 2. Mild osteoarthritis of the first MTP joint. Moderate first MTP joint effusion.     Electronically Signed   By: Elige Ko M.D.   On: 05/05/2023 13:57   Assessment:   1. 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  S/p foot surgery right -We reviewed the results of the CT scan.  She has minimal fusion across the arthrodesis site, so far has had minimal increase in  arthrodesis with noninvasive bone marrow stimulation.  We discussed that at this point I likely would recommend surgical revision she is 9 months out from surgery and has had little improvement the last 3 to 4 months and her symptoms.  We discussed the long-term would not want her to have any loss of correction which has been minimal thus far but also the continued impact of pain and swelling and inability to do the activities she would like to proceed with.  We discussed the possibilities of reason for the nonunion, her vitamin D and calcium level have been adequate, and that often the most common explanation is random more bad luck.  We discussed surgical revision with removal of the current implants resection of the nonunited areas and if there is a bony defect or gap that autograft from the heel and/or cadaveric allograft with bone marrow aspirate from the leg would be needed.  We also discussed that following revision that likely will have extended nonweightbearing for 6 to 8 weeks with a knee scooter with gradual healing.  She does have a slight keloid scar, we will do a we can to avoid this recurring and minimize absorbable sutures and utilize Prolene or minimally reactive sutures for skin.  May need corticosteroid injections scar massage and/or silicone scar sheets after surgery as well.  We discussed the risk benefits and potential complications including the risk of infection skin healing scar formation and again poor bone healing that could result in nonunion again.  I discussed with her that I think that the likelihood of this occurring is incredibly low.  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

## 2023-06-18 ENCOUNTER — Other Ambulatory Visit: Payer: Self-pay | Admitting: Cardiology

## 2023-06-18 DIAGNOSIS — E7800 Pure hypercholesterolemia, unspecified: Secondary | ICD-10-CM

## 2023-06-22 ENCOUNTER — Ambulatory Visit (INDEPENDENT_AMBULATORY_CARE_PROVIDER_SITE_OTHER): Payer: 59 | Admitting: Allergy & Immunology

## 2023-06-22 ENCOUNTER — Encounter: Payer: Self-pay | Admitting: Allergy & Immunology

## 2023-06-22 VITALS — BP 120/70 | HR 74 | Temp 97.9°F | Resp 18 | Ht 62.5 in | Wt 158.0 lb

## 2023-06-22 DIAGNOSIS — L239 Allergic contact dermatitis, unspecified cause: Secondary | ICD-10-CM

## 2023-06-22 DIAGNOSIS — J301 Allergic rhinitis due to pollen: Secondary | ICD-10-CM | POA: Diagnosis not present

## 2023-06-22 DIAGNOSIS — L501 Idiopathic urticaria: Secondary | ICD-10-CM | POA: Diagnosis not present

## 2023-06-22 NOTE — Progress Notes (Signed)
 FOLLOW UP  Date of Service/Encounter:  06/22/23   Assessment:   Seasonal allergic rhinitis due to pollen (trees, outdoor molds)   Rash that appears more urticarial per the pictures (occurs March through August) - marked improved with the initiation of Xolair , now remaining on it throughout the year since she worsened so much when she was off of it    Contact dermatitis (chromium and cobalt)   Elevated LFTs - followed by her PCP   Bilateral hearing loss - with hearing aids placed   Normal sinus CT (May 2023)  Sicca syndrome with HLA-B27 - follows with Dr. Dolphus    Plan/Recommendations:   1. Seasonal allergic rhinitis due to pollen (trees, outdoor molds) - Continue taking: Allegra daily. - Continue with Flonase as needed.   2. Seasonal urticaria  - We will continue with Xolair  monthly as we are doing now. - Continue with the Allegra (fexofenadine) daily. Th  . 3 Metal sensitivity (chromium and cobalt)  - Avoid triggering jewelry items.   4. Return in about 1 year (around 06/21/2024). You can have the follow up appointment with Dr. Iva or a Nurse Practicioner (our Nurse Practitioners are excellent and always have Physician oversight!).   Subjective:   Ann Bass is a 53 y.o. 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   Allergic contact dermatitis 08/09/2020   Rash 08/09/2020   Steatosis of liver 01/13/2019   Renal cyst, left 01/13/2019   Overweight (BMI 25.0-29.9) 01/13/2019   OSA (obstructive sleep apnea) 09/02/2017   Ascending aorta dilatation (HCC)    SOB (shortness of breath)  02/18/2017   Excessive daytime sleepiness 02/18/2017   Vitamin D  deficiency 09/17/2016   Physical exam 09/13/2014   Palpitations 09/13/2014   Decreased hearing of both ears 09/13/2014   Glucose intolerance (impaired glucose tolerance) 02/22/2013   Depression 02/22/2013   Hypothyroid 02/22/2013    History obtained from: chart review and patient.  Discussed the use of AI scribe software for clinical note transcription with the patient and/or guardian, who gave verbal consent to proceed.  Ann Bass is a 53 y.o. female presenting for a follow up visit. She was last seen in February 2024.  At that time, we continue with Allegra as well as Xyzal .  She also continue with Flonase.  Urticaria was controlled with Xolair  monthly as well as antihistamines.  She continued to avoid chromium and cobalt.  Since last visit, she has mostly done well from the hives perspective.  She manages her chronic hives with daily fexofenadine and monthly Xolair  injections. Discontinuation of fexofenadine leads to the reappearance of hives, while Xolair  injections have resulted in fewer breakthrough episodes. Fexofenadine is obtained over the counter in its generic form.  She has tried weaning the Allegra on a few occasions, but the hives resumed.  She experiences environmental allergies, generally well-controlled with Flonase, Xolair , and fexofenadine. However, she has a persistent runny nose that worsens with temperature changes. She has tried ipratropium for nasal dryness but finds it too drying and uses it sparingly.  She really does not want to try anything new.  She  has a history of sicca syndrome and HLA-B27 positive.  She follows with Dr. Dolphus.   Her family situation is challenging, with her children facing various issues. Her younger daughter, who has severe mental health issues, is working towards a GED after leaving high school. Her older daughter, who is autistic, attempted college but found it overwhelming  and is now considering local community college options. Her older daughter is high functioning but struggles with executive functioning and sensory auditory processing, requiring calm environments.     Her father died in Feb 05, 2024.  She had to travel to [place] for the funeral.  Then they traveled back to [place] in December because he already had a trip planned.  She is working on getting citizenship for both of her children.  Her husband, unfortunately, cannot get on the fast track for that.  She  Otherwise, there have been no changes to her past medical history, surgical history, family history, or social history.    Review of systems otherwise negative other than that mentioned in the HPI.    Objective:   Blood pressure 120/70, pulse 74, temperature 97.9 F (36.6 C), temperature source Temporal, resp. rate 18, height 5' 2.5 (1.588 m), weight 158 lb (71.7 kg), last menstrual period 10/27/2017, SpO2 95%. Body mass index is 28.44 kg/m.    Physical Exam Vitals reviewed.  Constitutional:      Appearance: She is well-developed and normal weight.     Comments: Pleasant.   HENT:     Head: Normocephalic and atraumatic.     Right Ear: Tympanic membrane, ear canal and external ear normal.     Left Ear: Tympanic membrane, ear canal and external ear normal.     Nose: No nasal deformity, septal deviation, mucosal edema or rhinorrhea.     Right Turbinates: Enlarged, swollen and pale.     Left Turbinates: Enlarged, swollen and pale.     Right Sinus: No maxillary sinus tenderness or frontal sinus tenderness.     Left Sinus: No maxillary sinus tenderness or frontal sinus tenderness.     Comments: No nasal polyps noted.     Mouth/Throat:     Mouth: Mucous membranes are not pale and not dry.     Pharynx: Uvula midline.  Eyes:     General: Lids are normal. No allergic shiner.       Right eye: No discharge.        Left eye: No discharge.     Conjunctiva/sclera: Conjunctivae normal.     Right  eye: Right conjunctiva is not injected. No chemosis.    Left eye: Left conjunctiva is not injected. No chemosis.    Pupils: Pupils are equal, round, and reactive to light.  Cardiovascular:     Rate and Rhythm: Normal rate and regular rhythm.     Heart sounds: Normal heart sounds.  Pulmonary:     Effort: Pulmonary effort is normal. No tachypnea, accessory muscle usage or respiratory distress.     Breath sounds: Normal breath sounds. No wheezing, rhonchi or rales.     Comments: Moving air well in all lung fields.  No increased work of breathing. Chest:     Chest wall: No tenderness.  Lymphadenopathy:     Cervical: No cervical adenopathy.  Skin:    Coloration: Skin is not pale.     Findings: No abrasion, erythema, petechiae or rash. Rash is not papular, urticarial or vesicular.  Neurological:     Mental Status: She is alert.  Psychiatric:  Behavior: Behavior is cooperative.      Diagnostic studies: none      Marty Shaggy, MD  [facility] 

## 2023-06-22 NOTE — Patient Instructions (Addendum)
 1. Seasonal allergic rhinitis due to pollen (trees, outdoor molds) - Continue taking: Allegra daily. - Continue with Flonase as needed.   2. Seasonal urticaria  - We will continue with Xolair  monthly as we are doing now. - Continue with the Allegra (fexofenadine) daily. Th  . 3 Metal sensitivity (chromium and cobalt)  - Avoid triggering jewelry items.   4. Return in about 1 year (around 06/21/2024). You can have the follow up appointment with Dr. Iva or a Nurse Practicioner (our Nurse Practitioners are excellent and always have Physician oversight!).    Please inform us  of any Emergency Department visits, hospitalizations, or changes in symptoms. Call us  before going to the ED for breathing or allergy  symptoms since we might be able to fit you in for a sick visit. Feel free to contact us  anytime with any questions, problems, or concerns.  It was a pleasure to see you again today!  Websites that have reliable patient information: 1. American Academy of Asthma, Allergy , and Immunology: www.aaaai.org 2. Food Allergy  Research and Education (FARE): foodallergy.org 3. Mothers of Asthmatics: http://www.asthmacommunitynetwork.org 4. [employer] of Allergy , Asthma, and Immunology: www.acaai.org      "Like" us  on Facebook and Instagram for our latest updates!      A healthy democracy works best when [employer] participate! Make sure you are registered to vote! If you have moved or changed any of your contact information, you will need to get this updated before voting! Scan the QR codes below to learn more!

## 2023-06-29 ENCOUNTER — Ambulatory Visit: Payer: 59 | Admitting: *Deleted

## 2023-06-29 DIAGNOSIS — L501 Idiopathic urticaria: Secondary | ICD-10-CM | POA: Diagnosis not present

## 2023-06-30 ENCOUNTER — Telehealth: Payer: Self-pay | Admitting: *Deleted

## 2023-06-30 ENCOUNTER — Other Ambulatory Visit: Payer: Self-pay | Admitting: Allergy & Immunology

## 2023-06-30 NOTE — Telephone Encounter (Signed)
-----   Message from Alfonse Spruce sent at 06/22/2023  1:06 PM EST ----- Patient wants to do home injections for Xolair.

## 2023-06-30 NOTE — Telephone Encounter (Signed)
L/m for patient message sent to [employer] that she will be getting her Xolair at home

## 2023-07-05 NOTE — Progress Notes (Signed)
 Office Visit Note  Patient: Ann Bass             Date of Birth: 1971-01-06           MRN: [id number]             PCP: Sheliah Hatch, MD Referring: Sheliah Hatch, MD Visit Date: 07/19/2023 Occupation: @GUAROCC @  Subjective:  Pain in multiple joints  History of Present Illness: Ann Bass is a 53 y.o. female with dry mouth, dry eyes, polyarthralgia, fatigue.  She returns today after last visit in August 2024.  She states she had reconstructive surgery on her right foot last June.  She states she is not healing well and has appointment coming up with Dr. Victorino Dike this week.  She continues to have pain on walking and has numbing sensation over the scar tissue.  She continues to have some stiffness in her neck and her lower back.  She continues to have intermittent discomfort in the SI joints.  Her knee joints continue to hurt.  She has not noticed any joint swelling.  There is no plantar fasciitis or Achilles tendinitis.    Activities of Daily Living:  Patient reports morning stiffness for 15 minutes.   Patient Reports nocturnal pain.  Difficulty dressing/grooming: Denies Difficulty climbing stairs: Denies Difficulty getting out of chair: Denies Difficulty using hands for taps, buttons, cutlery, and/or writing: Denies  Review of Systems  Constitutional:  Positive for fatigue.  HENT:  Positive for mouth dryness. Negative for mouth sores and nose dryness.   Eyes:  Positive for dryness. Negative for pain.  Respiratory:  Positive for shortness of breath. Negative for difficulty breathing.   Cardiovascular:  Positive for palpitations. Negative for chest pain.  Gastrointestinal:  Negative for blood in stool, constipation and diarrhea.  Endocrine: Negative for increased urination.  Genitourinary:  Negative for involuntary urination.  Musculoskeletal:  Positive for joint pain, gait problem, joint pain, joint swelling and morning stiffness. Negative for myalgias, muscle  weakness, muscle tenderness and myalgias.  Skin:  Positive for hair loss and sensitivity to sunlight. Negative for color change and rash.  Allergic/Immunologic: Positive for susceptible to infections.  Neurological:  Positive for dizziness and tremors. Negative for headaches.  Hematological:  Negative for swollen glands.  Psychiatric/Behavioral:  Positive for depressed mood. Negative for sleep disturbance. The patient is nervous/anxious.     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   Anxiety 01/27/2023   Hyperlipidemia 05/05/2022   Fatigue 05/05/2022   Upper airway cough syndrome 07/14/2021   Chronic rhinitis 07/14/2021   Splenic artery aneurysm (HCC) 03/04/2021   Allergic contact dermatitis 08/09/2020   Rash 08/09/2020   Steatosis of liver 01/13/2019   Renal cyst, left 01/13/2019   Overweight (BMI 25.0-29.9) 01/13/2019   OSA (obstructive sleep apnea) 09/02/2017   Ascending aorta dilatation (HCC)    SOB (shortness of breath) 02/18/2017   Excessive daytime sleepiness 02/18/2017   Vitamin D deficiency 09/17/2016   Physical exam 09/13/2014   Palpitations 09/13/2014   Decreased hearing of both ears 09/13/2014   Glucose intolerance (impaired glucose tolerance) 02/22/2013   Depression 02/22/2013   Hypothyroid 02/22/2013    Past Medical History:  Diagnosis Date   Allergy    Ascending aorta dilatation (HCC)    41mm on MRI 02/2021 and 44 mm by 2D echo 07-2021 and 44mm by Chest MRA 08/2022   Depression    post-partum  Dilated aortic root (HCC)    41mm by chest MRI 01/2020   Dyspnea    Dysrhythmia    occational   History of gestational diabetes    Hyperlipidemia    Hypertension    Hypothyroidism    Inflammatory polyps of colon (HCC)    Liver disease    OSA (obstructive sleep apnea) 09/02/2017   Mild OSA with an AHI of 6.8/h.  On CPAP   Palpitations    PACs noted on event monitor   Pre-diabetes    updated epic hx  08-23-2019 per pt she is pre-diabetic   Splenic artery aneurysm (HCC)    12mm on MRI 02/2021    Family History  Problem Relation Age of Onset   Diabetes Mother    Heart Problems Mother    Colon cancer Father 73 - 47   Cancer Father        colon   Ankylosing spondylitis Father    Healthy Sister    Healthy Sister    Cancer Paternal Grandmother        ovarian   Healthy Daughter    Healthy Daughter    Healthy Daughter    Allergic rhinitis Neg Hx    Angioedema Neg Hx    Asthma Neg Hx    Eczema Neg Hx    Immunodeficiency Neg Hx    Urticaria Neg Hx    Rectal cancer Neg Hx    Stomach cancer Neg Hx    Esophageal cancer Neg Hx    Past Surgical History:  Procedure Laterality Date   broken leg  1982   Left Femur, traction and pin in leg   BUNIONECTOMY Right 10/23/2022   COLONOSCOPY  2018   COLONOSCOPY  11/24/2022   FOOT SURGERY Right    ROBOTIC ASSISTED LAPAROSCOPIC HYSTERECTOMY AND SALPINGECTOMY Bilateral 08/24/2019   Procedure: XI ROBOTIC ASSISTED LAPAROSCOPIC HYSTERECTOMY AND SALPINGECTOMY;  Surgeon: Silverio Lay, MD;  Location:  SURGERY CENTER;  Service: Gynecology;  Laterality: Bilateral;   WISDOM TOOTH EXTRACTION  1998   Social History   Social History Narrative   Not on file   Immunization History  Administered Date(s) Administered   Influenza Inj Mdck Quad Pf 03/30/2017   Influenza, Quadrivalent, Recombinant, Inj, Pf 04/21/2018   Influenza, Seasonal, Injecte, Preservative Fre 01/27/2023   Influenza,inj,Quad PF,6+ Mos 05/25/2014, 01/13/2019, 04/17/2020, 05/06/2021, 05/05/2022   PFIZER Comirnaty(Gray Top)Covid-19 Tri-Sucrose Vaccine 12/12/2020   PFIZER(Purple Top)SARS-COV-2 Vaccination 10/24/2019, 11/14/2019   Tdap 09/13/2014   Unspecified SARS-COV-2 Vaccination 10/17/2019, 11/16/2019   Zoster Recombinant(Shingrix) 05/22/2022, 09/04/2022     Objective: Vital Signs: BP 120/82 (BP Location: Left Arm, Patient Position: Sitting, Cuff Size: Normal)    Pulse 63   Resp 13   Ht 5\' 2"  (1.575 m)   Wt 158 lb 9.6 oz (71.9 kg)   LMP 10/27/2017   BMI 29.01 kg/m    Physical Exam Vitals and nursing note reviewed.  Constitutional:      Appearance: She is well-developed.  HENT:     Head: Normocephalic and atraumatic.  Eyes:     Conjunctiva/sclera: Conjunctivae normal.  Cardiovascular:     Rate and Rhythm: Normal rate and regular rhythm.     Heart sounds: Normal heart sounds.  Pulmonary:     Effort: Pulmonary effort is normal.     Breath sounds: Normal breath sounds.  Abdominal:     General: Bowel sounds are normal.     Palpations: Abdomen is soft.  Musculoskeletal:     Cervical back: Normal range  of motion.  Lymphadenopathy:     Cervical: No cervical adenopathy.  Skin:    General: Skin is warm and dry.     Capillary Refill: Capillary refill takes less than 2 seconds.  Neurological:     Mental Status: She is alert and oriented to person, place, and time.  Psychiatric:        Behavior: Behavior normal.      Musculoskeletal Exam: Cervical, thoracic and lumbar spine were in good range of motion.  She had mild tenderness in the lumbar area SI joint region.  Shoulders, elbows, wrist joints, MCPs PIPs and DIPs with good range of motion without any synovitis.  Hip joints and knee joints with good range of motion without any warmth swelling or effusion.  There was no tenderness over Achilles tendon or plantar fascia.  She continues to have discomfort in her right foot.  CDAI Exam: CDAI Score: -- Patient Global: --; Provider Global: -- Swollen: --; Tender: -- Joint Exam 07/19/2023   No joint exam has been documented for this visit   There is currently no information documented on the homunculus. Go to the Rheumatology activity and complete the homunculus joint exam.  Investigation: No additional findings.  Imaging: No results found.  Recent Labs: Lab Results  Component Value Date   WBC 8.8 12/08/2022   HGB 15.0 12/08/2022    PLT 314.0 12/08/2022   NA 138 12/08/2022   K 4.1 12/08/2022   CL 103 12/08/2022   CO2 28 12/08/2022   GLUCOSE 98 12/08/2022   BUN 10 12/08/2022   CREATININE 0.60 12/08/2022   BILITOT 0.5 12/08/2022   ALKPHOS 51 12/08/2022   AST 22 12/08/2022   ALT 26 12/08/2022   PROT 6.7 12/08/2022   ALBUMIN 4.3 12/08/2022   CALCIUM 9.4 12/08/2022   GFRAA 122 12/22/2019    Speciality Comments: No specialty comments available.  Procedures:  No procedures performed Allergies: Penicillins and Doxycycline   Assessment / Plan:     Visit Diagnoses: Sicca syndrome (HCC) - ANA 1: 80 NS, NH, cytoplasmic, ENA negative, complements normal.  Patient has no clinical features of autoimmune disease.  She gives history of dry mouth and dry eyes.  There is no history of oral ulcers, nasal ulcers, malar rash, photosensitivity.  Over-the-counter products for sicca symptoms were discussed.  She declined pilocarpine.  HLA B27 positive - X-rays were unremarkable of the cervical spine.  SI joint x-rays showed some osteoarthritic changes.  Neck pain-she continues to have neck stiffness.  She good range of motion of the cervical spine.  Chronic SI joint pain-she is to have chronic SI joint pain.  Previous x-rays showed osteitis condensans ilii.  She had good mobility in the lumbar spine.  Chronic pain of both knees -she continues to have some discomfort in her knee joints.  No warmth swelling or effusion was noted.  X-rays of bilateral knee joints obtained were unremarkable.  Pes planus of both feet  S/P bunionectomy - Right October 23, 2022 by Dr. Lilian Kapur.  Patient continues to have discomfort in her right foot.  She states she will need revision of the right foot surgery and reconstruction.  She is going for a second opinion to see Dr. Victorino Dike.  Family history of ankylosing spondylitis-father  Osteoporosis screening-patient states she is having difficulty with bone healing.  She is postmenopausal.  Will schedule DEXA  scan.  Postmenopausal  Hypermobility of joint-noted in multiple joints.  Myofascial pain-she continues to have some generalized pain and discomfort.  Stretching exercises were emphasized.  Other fatigue  Brain fog  Ascending aorta dilatation (HCC) - Followed by Dr. Mayford Knife and Dr. Myra Gianotti  Splenic artery aneurysm [facility])  Steatosis of liver  History of hypothyroidism  History of hyperlipidemia  Vitamin D deficiency  History of depression  OSA (obstructive sleep apnea)  Orders: Orders Placed This Encounter  Procedures   DG Bone Density   No orders of the defined types were placed in this encounter.    Follow-Up Instructions: Return in about 6 months (around 01/19/2024) for Osteoarthritis.   Pollyann Savoy, MD  Note - This record has been created using Animal nutritionist.  Chart creation errors have been sought, but may not always  have been located. Such creation errors do not reflect on  the standard of medical care.

## 2023-07-12 ENCOUNTER — Telehealth: Payer: Self-pay | Admitting: Podiatry

## 2023-07-12 NOTE — Telephone Encounter (Signed)
 DOS-08/27/23  ARTHRODESIS LIS [facility] [id number] BONE GRAFT [id number]  AETNA EFFECTIVE DATE-05/18/20   PER THE AETNA AUTOMATED SYSTEM, PRIOR AUTH IS NOT REQUIRED FOR CPT CODES 16109 AND 20900.  CALL REF #: [phone]

## 2023-07-19 ENCOUNTER — Encounter: Payer: Self-pay | Admitting: Rheumatology

## 2023-07-19 ENCOUNTER — Ambulatory Visit: Payer: 59 | Attending: Rheumatology | Admitting: Rheumatology

## 2023-07-19 VITALS — BP 120/82 | HR 63 | Resp 13 | Ht 62.0 in | Wt 158.6 lb

## 2023-07-19 DIAGNOSIS — R5383 Other fatigue: Secondary | ICD-10-CM

## 2023-07-19 DIAGNOSIS — I728 Aneurysm of other specified arteries: Secondary | ICD-10-CM

## 2023-07-19 DIAGNOSIS — M542 Cervicalgia: Secondary | ICD-10-CM | POA: Diagnosis not present

## 2023-07-19 DIAGNOSIS — Z8269 Family history of other diseases of the musculoskeletal system and connective tissue: Secondary | ICD-10-CM

## 2023-07-19 DIAGNOSIS — I77810 Thoracic aortic ectasia: Secondary | ICD-10-CM

## 2023-07-19 DIAGNOSIS — Z9889 Other specified postprocedural states: Secondary | ICD-10-CM

## 2023-07-19 DIAGNOSIS — M25562 Pain in left knee: Secondary | ICD-10-CM

## 2023-07-19 DIAGNOSIS — M3500 Sicca syndrome, unspecified: Secondary | ICD-10-CM | POA: Diagnosis not present

## 2023-07-19 DIAGNOSIS — R4189 Other symptoms and signs involving cognitive functions and awareness: Secondary | ICD-10-CM

## 2023-07-19 DIAGNOSIS — G4733 Obstructive sleep apnea (adult) (pediatric): Secondary | ICD-10-CM

## 2023-07-19 DIAGNOSIS — M2141 Flat foot [pes planus] (acquired), right foot: Secondary | ICD-10-CM

## 2023-07-19 DIAGNOSIS — K760 Fatty (change of) liver, not elsewhere classified: Secondary | ICD-10-CM

## 2023-07-19 DIAGNOSIS — M533 Sacrococcygeal disorders, not elsewhere classified: Secondary | ICD-10-CM | POA: Diagnosis not present

## 2023-07-19 DIAGNOSIS — G8929 Other chronic pain: Secondary | ICD-10-CM

## 2023-07-19 DIAGNOSIS — Z1589 Genetic susceptibility to other disease: Secondary | ICD-10-CM

## 2023-07-19 DIAGNOSIS — Z13820 Encounter for screening for osteoporosis: Secondary | ICD-10-CM

## 2023-07-19 DIAGNOSIS — E559 Vitamin D deficiency, unspecified: Secondary | ICD-10-CM

## 2023-07-19 DIAGNOSIS — M249 Joint derangement, unspecified: Secondary | ICD-10-CM

## 2023-07-19 DIAGNOSIS — Z780 Asymptomatic menopausal state: Secondary | ICD-10-CM

## 2023-07-19 DIAGNOSIS — M2142 Flat foot [pes planus] (acquired), left foot: Secondary | ICD-10-CM

## 2023-07-19 DIAGNOSIS — Z8659 Personal history of other mental and behavioral disorders: Secondary | ICD-10-CM

## 2023-07-19 DIAGNOSIS — M7918 Myalgia, other site: Secondary | ICD-10-CM

## 2023-07-19 DIAGNOSIS — Z8639 Personal history of other endocrine, nutritional and metabolic disease: Secondary | ICD-10-CM

## 2023-07-19 DIAGNOSIS — M25561 Pain in right knee: Secondary | ICD-10-CM

## 2023-07-23 ENCOUNTER — Telehealth: Payer: Self-pay | Admitting: Family Medicine

## 2023-07-23 DIAGNOSIS — S92309A Fracture of unspecified metatarsal bone(s), unspecified foot, initial encounter for closed fracture: Secondary | ICD-10-CM | POA: Insufficient documentation

## 2023-07-23 DIAGNOSIS — T849XXA Unspecified complication of internal orthopedic prosthetic device, implant and graft, initial encounter: Secondary | ICD-10-CM | POA: Insufficient documentation

## 2023-07-23 NOTE — Telephone Encounter (Signed)
 Please make patient surgical clearance appointment please

## 2023-07-23 NOTE — Telephone Encounter (Signed)
 Type of form received: Surgical Clearance  Additional comments: Pt was last seen in office 06/10/23  Received by: [employer] - Front Desk  Form should be Faxed/mailed to: (address/ fax #) Fax to [phone]  Is patient requesting call for pickup: N/A  Form placed:  Labeled & placed in provider bin  Attach charge sheet.  Provider will determine charge.  Individual made aware of 3-5 business day turn around? N/A

## 2023-07-27 ENCOUNTER — Ambulatory Visit: Payer: 59 | Admitting: *Deleted

## 2023-07-27 DIAGNOSIS — L501 Idiopathic urticaria: Secondary | ICD-10-CM

## 2023-08-03 ENCOUNTER — Encounter: Payer: Self-pay | Admitting: Family Medicine

## 2023-08-03 ENCOUNTER — Ambulatory Visit (INDEPENDENT_AMBULATORY_CARE_PROVIDER_SITE_OTHER): Admitting: Family Medicine

## 2023-08-03 VITALS — BP 104/68 | HR 63 | Temp 97.8°F | Ht 62.0 in | Wt 158.0 lb

## 2023-08-03 DIAGNOSIS — Z01818 Encounter for other preprocedural examination: Secondary | ICD-10-CM

## 2023-08-03 LAB — CBC WITH DIFFERENTIAL/PLATELET
Basophils Absolute: 0.1 10*3/uL (ref 0.0–0.1)
Basophils Relative: 0.8 % (ref 0.0–3.0)
Eosinophils Absolute: 0.3 10*3/uL (ref 0.0–0.7)
Eosinophils Relative: 4.1 % (ref 0.0–5.0)
HCT: 45 % (ref 36.0–46.0)
Hemoglobin: 15.3 g/dL — ABNORMAL HIGH (ref 12.0–15.0)
Lymphocytes Relative: 21.5 % (ref 12.0–46.0)
Lymphs Abs: 1.6 10*3/uL (ref 0.7–4.0)
MCHC: 33.9 g/dL (ref 30.0–36.0)
MCV: 89.3 fl (ref 78.0–100.0)
Monocytes Absolute: 0.4 10*3/uL (ref 0.1–1.0)
Monocytes Relative: 5.4 % (ref 3.0–12.0)
Neutro Abs: 5 10*3/uL (ref 1.4–7.7)
Neutrophils Relative %: 68.2 % (ref 43.0–77.0)
Platelets: 271 10*3/uL (ref 150.0–400.0)
RBC: 5.04 Mil/uL (ref 3.87–5.11)
RDW: 13.3 % (ref 11.5–15.5)
WBC: 7.3 10*3/uL (ref 4.0–10.5)

## 2023-08-03 LAB — BASIC METABOLIC PANEL
BUN: 8 mg/dL (ref 6–23)
CO2: 26 meq/L (ref 19–32)
Calcium: 9.3 mg/dL (ref 8.4–10.5)
Chloride: 103 meq/L (ref 96–112)
Creatinine, Ser: 0.52 mg/dL (ref 0.40–1.20)
GFR: 106.78 mL/min (ref >60.00)
Glucose, Bld: 81 mg/dL (ref 70–99)
Potassium: 3.9 meq/L (ref 3.5–5.1)
Sodium: 136 meq/L (ref 135–145)

## 2023-08-03 LAB — HEPATIC FUNCTION PANEL
ALT: 34 U/L (ref 0–35)
AST: 26 U/L (ref 0–37)
Albumin: 4.3 g/dL (ref 3.5–5.2)
Alkaline Phosphatase: 47 U/L (ref 39–117)
Bilirubin, Direct: 0.2 mg/dL (ref 0.0–0.3)
Total Bilirubin: 0.7 mg/dL (ref 0.2–1.2)
Total Protein: 6.6 g/dL (ref 6.0–8.3)

## 2023-08-03 LAB — VITAMIN D 25 HYDROXY (VIT D DEFICIENCY, FRACTURES): VITD: 53.35 ng/mL (ref 30.00–100.00)

## 2023-08-03 LAB — TSH: TSH: 1.92 u[IU]/mL (ref 0.35–5.50)

## 2023-08-03 LAB — HEMOGLOBIN A1C: Hgb A1c MFr Bld: 6.1 % (ref 4.6–6.5)

## 2023-08-03 NOTE — Patient Instructions (Signed)
 Follow up as needed or as scheduled We'll notify you of your lab results and make any changes if needed Call with any questions or concerns Stay Safe!  Stay Healthy! GOOD LUCK!!!

## 2023-08-03 NOTE — Progress Notes (Signed)
 Subjective:    Ann Bass is a 53 y.o. female who presents to the office today for a preoperative consultation at the request of surgeon Dr Victorino Dike who plans on performing removal of R foot hardware and bunion revision on  TBD . This consultation is requested for the specific conditions prompting preoperative evaluation (i.e. because of potential affect on operative risk): OSA, aortic aneurysm. Planned anesthesia: general. The patient has the following known anesthesia issues:  no hx of issues . Patients bleeding risk: no recent abnormal bleeding, no remote history of abnormal bleeding, and no use of Ca-channel blockers. Patient does not have objections to receiving blood products if needed.  The following portions of the patient's history were reviewed and updated as appropriate: allergies, current medications, past family history, past medical history, past social history, past surgical history, and problem list.  Review of Systems A comprehensive review of systems was negative.    Objective:    BP 104/68   Pulse 63   Temp 97.8 F (36.6 C)   Ht 5\' 2"  (1.575 m)   Wt 158 lb (71.7 kg)   LMP 10/27/2017   SpO2 96%   BMI 28.90 kg/m   General Appearance:    Alert, cooperative, no distress, appears stated age  Head:    Normocephalic, without obvious abnormality, atraumatic  Eyes:    PERRL, conjunctiva/corneas clear, EOM's intact both eyes  Ears:    Normal TM's and external ear canals, both ears  Nose:   Nares normal, septum midline, mucosa normal, no drainage    or sinus tenderness  Throat:   Lips, mucosa, and tongue normal; teeth and gums normal  Neck:   Supple, symmetrical, trachea midline, no adenopathy;    thyroid:  no enlargement/tenderness/nodules  Back:     Symmetric, no curvature, ROM normal, no CVA tenderness  Lungs:     Clear to auscultation bilaterally, respirations unlabored  Chest Wall:    No tenderness or deformity   Heart:    Regular rate and rhythm, S1 and S2 normal,  no murmur, rub   or gallop  Breast Exam:    Deferred  Abdomen:     Soft, non-tender, bowel sounds active all four quadrants,    no masses, no organomegaly  Genitalia:    deferred  Rectal:    Extremities:   Extremities normal, atraumatic, no cyanosis or edema  Pulses:   2+ and symmetric all extremities  Skin:   Skin color, texture, turgor normal, no rashes or lesions  Lymph nodes:   Cervical, supraclavicular, and axillary nodes normal  Neurologic:   CNII-XII intact, normal strength, sensation and reflexes    throughout    Predictors of intubation difficulty:  Morbid obesity? no  Anatomically abnormal facies? no  Prominent incisors? no  Receding mandible? no  Short, thick neck? no  Neck range of motion: normal  Dentition: No chipped, loose, or missing teeth.  Cardiographics ECG: no change since previous ECG dated 11/2022 Echocardiogram: not done  Imaging Chest x-ray:  NA    Lab Review  Pending    Assessment:      53 y.o. female with planned surgery as above.   Known risk factors for perioperative complications: OSA Difficulty with intubation is not anticipated.  Cardiac Risk Estimation: low  Current medications which may produce withdrawal symptoms if withheld perioperatively: NA    Plan:    1. Preoperative workup as follows ECG, hemoglobin, hematocrit, electrolytes, creatinine, glucose, liver function studies. 2. Change in medication regimen  before surgery: none, continue medication regimen including morning of surgery, with sip of water. 3. Prophylaxis for cardiac events with perioperative beta-blockers: not indicated. 4. Invasive hemodynamic monitoring perioperatively: at the discretion of anesthesiologist. 5. Deep vein thrombosis prophylaxis postoperatively:regimen to be chosen by surgical team. 6. Surveillance for postoperative MI with ECG immediately postoperatively and on postoperative days 1 and 2 AND troponin levels 24 hours postoperatively and on day 4 or  hospital discharge (whichever comes first): at the discretion of anesthesiologist. 7. Other measures:  none

## 2023-08-04 ENCOUNTER — Encounter: Payer: Self-pay | Admitting: Family Medicine

## 2023-08-04 ENCOUNTER — Telehealth: Payer: Self-pay

## 2023-08-04 NOTE — Telephone Encounter (Signed)
-----   Message from Neena Rhymes sent at 08/04/2023  1:03 PM EDT ----- Labs are stable and look good!  Ok to proceed w/ surgery

## 2023-08-04 NOTE — Telephone Encounter (Signed)
 Pt has been notified via MyChart.

## 2023-08-09 ENCOUNTER — Other Ambulatory Visit: Payer: Self-pay | Admitting: Orthopedic Surgery

## 2023-08-09 ENCOUNTER — Telehealth: Payer: Self-pay | Admitting: Podiatry

## 2023-08-09 NOTE — Telephone Encounter (Signed)
 Pt left message today @ 1118am that she was wanting to cancel her surgery. She canceled all her post op appts thru my chart.  I called pt back and pt is canceling surgery as she has decided to go with a different provider.

## 2023-08-20 ENCOUNTER — Encounter: Payer: Self-pay | Admitting: Family Medicine

## 2023-08-20 MED ORDER — FLUOXETINE HCL 20 MG PO TABS: 20.0000 mg | ORAL_TABLET | Freq: Every day | ORAL | 1 refills | Status: DC

## 2023-08-20 MED ORDER — FLUOXETINE HCL 40 MG PO CAPS: 40.0000 mg | ORAL_CAPSULE | Freq: Every day | ORAL | 1 refills | Status: DC

## 2023-08-24 ENCOUNTER — Ambulatory Visit

## 2023-08-24 DIAGNOSIS — L501 Idiopathic urticaria: Secondary | ICD-10-CM | POA: Diagnosis not present

## 2023-08-26 ENCOUNTER — Telehealth: Payer: Self-pay | Admitting: *Deleted

## 2023-08-26 ENCOUNTER — Other Ambulatory Visit: Payer: Self-pay

## 2023-08-26 ENCOUNTER — Encounter (HOSPITAL_BASED_OUTPATIENT_CLINIC_OR_DEPARTMENT_OTHER): Payer: Self-pay | Admitting: Orthopedic Surgery

## 2023-08-26 NOTE — Progress Notes (Signed)
   08/26/23 1044  Pre-op Phone Call  Surgery Date Verified 09/02/23  Arrival Time Verified 0810  Surgery Location Verified [facility]  Medical History Reviewed Yes  Is the patient taking a GLP-1 receptor agonist? No  Does the patient have diabetes? Pre-diabetes  Do you have a history of heart problems? Yes  Cardiologist Name Dr. Mayford Knife  Have you ever had tests on your heart? Yes  What cardiac tests were performed? Echo;EKG  Results viewable: [facility] Media Tab  Does patient have other implanted devices? No  Patient Teaching Enhanced Recovery;Pre / Post Procedure  Patient educated about smoking cessation 24 hours prior to surgery. N/A Non-Smoker  Patient verbalizes understanding of bowel prep? N/A  THA/TKA patients only:  By your surgery date, will you have been taking narcotics for 90 days or greater? No  Med Rec Completed Yes  Take the Following Meds the Morning of Surgery prozac, allergy meds and metropolol  Recent  Lab Work, EKG, CXR? Yes  NPO (Including gum & candy) After midnight  Allowed clear liquids Water;Gatorade  (diabetics please choose diet or no sugar options)  Patient instructed to stop clear liquids including Carb loading drink at: 0700  Stop Solids, Milk, Candy, and Gum STARTING AT MIDNIGHT  Did patient view EMMI videos? No  Responsible adult to drive and be with you for 24 hours? Yes  Name & Phone Number for Ride/Caregiver Husband Brad  No Jewelry, money, nail polish or make-up.  No lotions, powders, perfumes. No shaving  48 hrs. prior to surgery. Yes  Contacts, Dentures & Glasses Will Have to be Removed Before OR. Yes  Please bring your ID and Insurance Card the morning of your surgery. (Surgery Centers Only) Yes  Bring any papers or x-rays with you that your surgeon gave you. Yes  Instructed to contact the location of procedure/ provider if they or anyone in their household develops symptoms or tests positive for COVID-19, has close contact with someone who tests positive for  COVID, or has known exposure to any contagious illness. Yes  Call this number the morning of surgery  with any problems that may cancel your surgery. [phone]  Covid-19 Assessment  Have you had a positive COVID-19 test within the previous 90 days? No  COVID Testing Guidance Proceed with the additional questions.  Patient's surgery required a COVID-19 test (cardiothoracic, complex ENT, and bronchoscopies/ EBUS) No  Have you been unmasked and in close contact with anyone with COVID-19 or COVID-19 symptoms within the past 10 days? No  Do you or anyone in your household currently have any COVID-19 symptoms? No   Talked to [facility] with Dr. Angelica Pou office about needing a cards clearance and scheduled MRA to be completed for this patient before surgery at [facility] per dr. Bradley Ferris

## 2023-08-26 NOTE — Telephone Encounter (Signed)
   Name: Ann Bass  DOB: 09-16-1970  MRN: [id number]  Primary Cardiologist: Armanda Magic, MD   Preoperative team, please contact this patient and set up a phone call appointment for further preoperative risk assessment. Please obtain consent and complete medication review. Thank you for your help.  I confirm that guidance regarding antiplatelet and oral anticoagulation therapy has been completed and, if necessary, noted below.  None  I also confirmed the patient resides in the state of [place]. As per [facility] Medical Board telemedicine laws, the patient must reside in the state in which the provider is licensed.   Napoleon Form, Leodis Rains, NP 08/26/2023, 12:12 PM [facility]

## 2023-08-26 NOTE — Telephone Encounter (Signed)
 Pt has been scheduled tele preop appt 08/30/23. Med rec and consent are done.

## 2023-08-26 NOTE — Telephone Encounter (Signed)
 Pt has been scheduled tele preop appt 08/30/23. Med rec and consent are done.      Patient Consent for Virtual Visit        Ann Bass has provided verbal consent on 08/26/2023 for a virtual visit (video or telephone).   CONSENT FOR VIRTUAL VISIT FOR:  Ann Bass  By participating in this virtual visit I agree to the following:  I hereby voluntarily request, consent and authorize [facility] and its employed or contracted physicians, physician assistants, nurse practitioners or other licensed health care professionals (the Practitioner), to provide me with telemedicine health care services (the "Services") as deemed necessary by the treating Practitioner. I acknowledge and consent to receive the Services by the Practitioner via telemedicine. I understand that the telemedicine visit will involve communicating with the Practitioner through live audiovisual communication technology and the disclosure of certain medical information by electronic transmission. I acknowledge that I have been given the opportunity to request an in-person assessment or other available alternative prior to the telemedicine visit and am voluntarily participating in the telemedicine visit.  I understand that I have the right to withhold or withdraw my consent to the use of telemedicine in the course of my care at any time, without affecting my right to future care or treatment, and that the Practitioner or I may terminate the telemedicine visit at any time. I understand that I have the right to inspect all information obtained and/or recorded in the course of the telemedicine visit and may receive copies of available information for a reasonable fee.  I understand that some of the potential risks of receiving the Services via telemedicine include:  Delay or interruption in medical evaluation due to technological equipment failure or disruption; Information transmitted may not be sufficient (e.g. poor  resolution of images) to allow for appropriate medical decision making by the Practitioner; and/or  In rare instances, security protocols could fail, causing a breach of personal health information.  Furthermore, I acknowledge that it is my responsibility to provide information about my medical history, conditions and care that is complete and accurate to the best of my ability. I acknowledge that Practitioner's advice, recommendations, and/or decision may be based on factors not within their control, such as incomplete or inaccurate data provided by me or distortions of diagnostic images or specimens that may result from electronic transmissions. I understand that the practice of medicine is not an exact science and that Practitioner makes no warranties or guarantees regarding treatment outcomes. I acknowledge that a copy of this consent can be made available to me via my patient portal [facility] MyChart), or I can request a printed copy by calling the office of [facility].    I understand that my insurance will be billed for this visit.   I have read or had this consent read to me. I understand the contents of this consent, which adequately explains the benefits and risks of the Services being provided via telemedicine.  I have been provided ample opportunity to ask questions regarding this consent and the Services and have had my questions answered to my satisfaction. I give my informed consent for the services to be provided through the use of telemedicine in my medical care

## 2023-08-26 NOTE — Anesthesia Preprocedure Evaluation (Addendum)
 Anesthesia Evaluation  Patient identified by MRN, date of birth, ID band Patient awake    Reviewed: Allergy & Precautions, H&P , NPO status , Patient's Chart, lab work & pertinent test results  Airway Mallampati: II  TM Distance: >3 FB Neck ROM: Full    Dental no notable dental hx. (+) Teeth Intact, Dental Advisory Given   Pulmonary neg pulmonary ROS, shortness of breath, sleep apnea and Continuous Positive Airway Pressure Ventilation    Pulmonary exam normal breath sounds clear to auscultation       Cardiovascular hypertension, Pt. on home beta blockers and Pt. on medications negative cardio ROS Normal cardiovascular exam+ dysrhythmias  Rhythm:Regular Rate:Normal  Ascending aorta dilatation   Neuro/Psych  PSYCHIATRIC DISORDERS Anxiety Depression    negative neurological ROS  negative psych ROS   GI/Hepatic negative GI ROS, Neg liver ROS,,,  Endo/Other  negative endocrine ROSHypothyroidism    Renal/GU Renal diseasenegative Renal ROS  negative genitourinary   Musculoskeletal negative musculoskeletal ROS (+)    Abdominal   Peds negative pediatric ROS (+)  Hematology negative hematology ROS (+)   Anesthesia Other Findings Closed fracture of metatarsal bone  Reproductive/Obstetrics negative OB ROS                             Anesthesia Physical Anesthesia Plan  ASA: 3  Anesthesia Plan: General and Regional   Post-op Pain Management: Celebrex PO (pre-op)*, Tylenol PO (pre-op)*, Minimal or no pain anticipated and Regional block*   Induction: Intravenous  PONV Risk Score and Plan: 3 and Ondansetron, Dexamethasone and Treatment may vary due to age or medical condition  Airway Management Planned: LMA  Additional Equipment: None  Intra-op Plan:   Post-operative Plan: Extubation in OR  Informed Consent: I have reviewed the patients History and Physical, chart, labs and discussed the  procedure including the risks, benefits and alternatives for the proposed anesthesia with the patient or authorized representative who has indicated his/her understanding and acceptance.     Dental advisory given  Plan Discussed with: CRNA, Surgeon and Anesthesiologist  Anesthesia Plan Comments: ( )       Anesthesia Quick Evaluation

## 2023-08-26 NOTE — Telephone Encounter (Signed)
   Pre-operative Risk Assessment    Patient Name: Ann Bass  DOB: Jun 22, 1970 MRN: [id number]   Date of last office visit: 11/23/22 DR. TURNER Date of next office visit: NONOE   Request for Surgical Clearance    Procedure:   REMOVAL OF DEEP IMPLANTS RIGHT FOOT 1ST MT AND MEDIAL CUNEIFORM; REVISION LAPIDUS BUNION CORRECTION; AUTOGRAFT FROM THE CALCANEUS   Date of Surgery:  Clearance 09/02/23                                Surgeon:  DR. Jonny Ruiz HEWITT Surgeon's Group or Practice Name:  [facility] Phone number:  [phone] MEGAN DAVIS Fax number:  [phone]   Type of Clearance Requested:   - Medical ; NONE INDICATED PER FORM TO BE HELD   Type of Anesthesia:  General    Additional requests/questions:    Ann Bass   08/26/2023, 12:05 PM

## 2023-08-30 ENCOUNTER — Ambulatory Visit: Attending: Internal Medicine | Admitting: Emergency Medicine

## 2023-08-30 DIAGNOSIS — Z01810 Encounter for preprocedural cardiovascular examination: Secondary | ICD-10-CM

## 2023-08-30 NOTE — Progress Notes (Signed)
 Virtual Visit via Telephone Note   Because of Ann Bass co-morbid illnesses, she is at least at moderate risk for complications without adequate follow up.  This format is felt to be most appropriate for this patient at this time.  Due to technical limitations with video connection Web designer), today's appointment will be conducted as an audio only telehealth visit, and Ann Bass verbally agreed to proceed in this manner.   All issues noted in this document were discussed and addressed.  No physical exam could be performed with this format.  Evaluation Performed:  Preoperative cardiovascular risk assessment _____________   Date:  08/30/2023   Patient ID:  Ann Bass, DOB 08-17-1970, MRN [id number] Patient Location:  Home Provider location:   Office  Primary Care Provider:  Sheliah Hatch, MD Primary Cardiologist:  Ann Magic, MD  Chief Complaint / Patient Profile   53 y.o. y/o female with a h/o obstructive sleep apnea, dilated ascending aorta, PACs, hyperlipidemia, splenic artery aneurysm, dyspnea on exertion, hypertension who is pending removal of deep implants right foot first MT and medial cuneiform, revision lapidus bunion correction, autograft from calcaneus on 09/02/2023 with [facility] by Dr. Victorino Bass and presents today for telephonic preoperative cardiovascular risk assessment.  History of Present Illness    Ann Bass is a 53 y.o. female who presents via audio/video conferencing for a telehealth visit today.  Pt was last seen in cardiology clinic on 11/23/2022 by Ann Bass.  At that time Ann Bass was doing well.  The patient is now pending procedure as outlined above. Since her last visit, she denies chest pain, shortness of breath, lower extremity edema, fatigue, palpitations, melena, hematuria, hemoptysis, diaphoresis, weakness, presyncope, syncope, orthopnea, and PND.  Patient is doing well overall.  She denies any cardiovascular concerns or  complaints today or ever the past several months.  She continues to stay relatively active without any anginal symptoms.  She denies any exertional angina or dyspnea today.  Past Medical History    Past Medical History:  Diagnosis Date   Allergy    Ascending aorta dilatation (HCC)    41mm on MRI 02/2021 and 44 mm by 2D echo 07-2021 and 44mm by Chest MRA 08/2022   Depression    post-partum   Dilated aortic root (HCC)    41mm by chest MRI 01/2020   Dyspnea    Dysrhythmia    occational   History of gestational diabetes    Hyperlipidemia    Hypertension    Hypothyroidism    Inflammatory polyps of colon (HCC)    Liver disease    OSA (obstructive sleep apnea) 09/02/2017   Mild OSA with an AHI of 6.8/h.  On CPAP   Palpitations    PACs noted on event monitor   Pre-diabetes    updated epic hx 08-23-2019 per pt she is pre-diabetic   Splenic artery aneurysm (HCC)    12mm on MRI 02/2021   Past Surgical History:  Procedure Laterality Date   broken leg  1982   Left Femur, traction and pin in leg   BUNIONECTOMY Right 10/23/2022   COLONOSCOPY  2018   COLONOSCOPY  11/24/2022   FOOT SURGERY Right    ROBOTIC ASSISTED LAPAROSCOPIC HYSTERECTOMY AND SALPINGECTOMY Bilateral 08/24/2019   Procedure: XI ROBOTIC ASSISTED LAPAROSCOPIC HYSTERECTOMY AND SALPINGECTOMY;  Surgeon: Ann Lay, MD;  Location: [facility] [place];  Service: Gynecology;  Laterality: Bilateral;   WISDOM TOOTH EXTRACTION  1998    Allergies  Allergies  Allergen Reactions   Penicillins Rash   Doxycycline Rash    Home Medications    Prior to Admission medications   Medication Sig Start Date End Date Taking? Authorizing Provider  Ascorbic Acid (VITAMIN C) 1000 MG tablet Take 1,000 mg by mouth daily.    [provider]  cholecalciferol (VITAMIN D3) 25 MCG (1000 UNIT) tablet Take 1,000 Units by mouth daily.    [provider]  clobetasol (TEMOVATE) 0.05 % external solution Apply 1 Application  topically 2 (two) times daily. 04/21/23   Ann Hatch, MD  diazepam (VALIUM) 2 MG tablet Take 1 tablet (2 mg total) by mouth every 8 (eight) hours as needed for anxiety. 04/21/23   Ann Hatch, MD  EPINEPHrine 0.3 mg/0.3 mL IJ SOAJ injection  08/20/20   [provider]  fexofenadine (ALLEGRA) 180 MG tablet Take 180 mg by mouth daily.    [provider]  FLUoxetine (PROZAC) 20 MG tablet Take 1 tablet (20 mg total) by mouth daily. Take in addition to 40mg  capsule for total of 60mg  daily 08/20/23   Ann Hatch, MD  FLUoxetine (PROZAC) 40 MG capsule Take 1 capsule (40 mg total) by mouth daily. 08/20/23   Ann Hatch, MD  levocetirizine (XYZAL) 5 MG tablet Take a tablet 1-2 times daily during the spring/summer to keep the hives at bay in combination with your Allegra daily. Patient taking differently: as needed. Take a tablet 1-2 times daily during the spring/summer to keep the hives at bay in combination with your Allegra daily. 11/05/20   Ann Spruce, MD  Magnesium Oxide 400 MG CAPS Take 400 mg by mouth at bedtime. 05/19/19   [provider]  metoprolol succinate (TOPROL XL) 25 MG 24 hr tablet Take 1 tablet (25 mg total) by mouth daily. 11/23/22   Ann Reichert, MD  Multiple Vitamin (MULTIVITAMIN) tablet Take 1 tablet by mouth daily.    [provider]  Omega-3 Fatty Acids (FISH OIL) 1000 MG CAPS Take 1,000 mg by mouth daily.     [provider]  rosuvastatin (CRESTOR) 40 MG tablet TAKE 1 TABLET BY MOUTH EVERY DAY 06/18/23   Ann Reichert, MD  triamcinolone ointment (KENALOG) 0.1 % Apply 1 application topically 2 (two) times daily. Patient taking differently: Apply 1 application  topically as needed. 06/14/20   Ann Spruce, MD  XOLAIR 150 MG/ML prefilled syringe INJECT 2 SYRINGES UNDER THE SKIN EVERY 4 WEEKS 06/30/23   Ann Spruce, MD    Physical Exam    Vital Signs:  Ann Bass does not have vital  signs available for review today.  Given telephonic nature of communication, physical exam is limited. AAOx3. NAD. Normal affect.  Speech and respirations are unlabored.  Accessory Clinical Findings    None  Assessment & Plan    1.  Preoperative Cardiovascular Risk Assessment: According to the Revised Cardiac Risk Index (RCRI), her Perioperative Risk of Major Cardiac Event is (%): 0.4. Her Functional Capacity in METs is: 7.44 according to the [facility] Activity Status Index (DASI). Therefore, based on ACC/AHA guidelines, patient would be at acceptable risk for the planned procedure without further cardiovascular testing.   The patient was advised that if she develops new symptoms prior to surgery to contact our office to arrange for a follow-up visit, and she verbalized understanding.  A copy of this note will be routed to requesting surgeon.  Time:   Today, I have spent 8 minutes with  the patient with telehealth technology discussing medical history, symptoms, and management plan.     Ava Boatman, NP  08/30/2023, 10:09 AM

## 2023-09-02 ENCOUNTER — Ambulatory Visit (HOSPITAL_BASED_OUTPATIENT_CLINIC_OR_DEPARTMENT_OTHER)

## 2023-09-02 ENCOUNTER — Other Ambulatory Visit: Payer: Self-pay

## 2023-09-02 ENCOUNTER — Ambulatory Visit (HOSPITAL_BASED_OUTPATIENT_CLINIC_OR_DEPARTMENT_OTHER): Payer: Self-pay | Admitting: Anesthesiology

## 2023-09-02 ENCOUNTER — Ambulatory Visit (HOSPITAL_BASED_OUTPATIENT_CLINIC_OR_DEPARTMENT_OTHER)
Admission: RE | Admit: 2023-09-02 | Discharge: 2023-09-02 | Disposition: A | Discharge status: Home / Self Care | Attending: Orthopedic Surgery | Admitting: Orthopedic Surgery

## 2023-09-02 ENCOUNTER — Encounter (HOSPITAL_BASED_OUTPATIENT_CLINIC_OR_DEPARTMENT_OTHER)
Admission: RE | Disposition: A | Discharge status: Home / Self Care | Payer: Self-pay | Source: Home / Self Care | Attending: Orthopedic Surgery

## 2023-09-02 ENCOUNTER — Encounter (HOSPITAL_BASED_OUTPATIENT_CLINIC_OR_DEPARTMENT_OTHER): Payer: Self-pay | Admitting: Orthopedic Surgery

## 2023-09-02 ENCOUNTER — Encounter: Payer: 59 | Admitting: Podiatry

## 2023-09-02 DIAGNOSIS — X58XXXA Exposure to other specified factors, initial encounter: Secondary | ICD-10-CM | POA: Diagnosis not present

## 2023-09-02 DIAGNOSIS — G4730 Sleep apnea, unspecified: Secondary | ICD-10-CM | POA: Diagnosis not present

## 2023-09-02 DIAGNOSIS — I10 Essential (primary) hypertension: Secondary | ICD-10-CM | POA: Diagnosis not present

## 2023-09-02 DIAGNOSIS — G4733 Obstructive sleep apnea (adult) (pediatric): Secondary | ICD-10-CM

## 2023-09-02 DIAGNOSIS — S92314K Nondisplaced fracture of first metatarsal bone, right foot, subsequent encounter for fracture with nonunion: Secondary | ICD-10-CM | POA: Diagnosis not present

## 2023-09-02 DIAGNOSIS — Z79899 Other long term (current) drug therapy: Secondary | ICD-10-CM | POA: Insufficient documentation

## 2023-09-02 DIAGNOSIS — F419 Anxiety disorder, unspecified: Secondary | ICD-10-CM | POA: Insufficient documentation

## 2023-09-02 DIAGNOSIS — R0602 Shortness of breath: Secondary | ICD-10-CM | POA: Diagnosis not present

## 2023-09-02 DIAGNOSIS — M960 Pseudarthrosis after fusion or arthrodesis: Secondary | ICD-10-CM | POA: Insufficient documentation

## 2023-09-02 DIAGNOSIS — Z01818 Encounter for other preprocedural examination: Secondary | ICD-10-CM

## 2023-09-02 DIAGNOSIS — F32A Depression, unspecified: Secondary | ICD-10-CM | POA: Diagnosis not present

## 2023-09-02 DIAGNOSIS — E039 Hypothyroidism, unspecified: Secondary | ICD-10-CM | POA: Diagnosis not present

## 2023-09-02 DIAGNOSIS — T8484XA Pain due to internal orthopedic prosthetic devices, implants and grafts, initial encounter: Secondary | ICD-10-CM | POA: Insufficient documentation

## 2023-09-02 HISTORY — PX: HARVEST BONE GRAFT: SHX377

## 2023-09-02 HISTORY — PX: HARDWARE REMOVAL: SHX979

## 2023-09-02 HISTORY — PX: BUNIONECTOMY: SHX129

## 2023-09-02 SURGERY — REMOVAL, HARDWARE
Anesthesia: Regional | Site: Foot | Laterality: Right

## 2023-09-02 MED ORDER — 0.9 % SODIUM CHLORIDE (POUR BTL) OPTIME
TOPICAL | Status: DC | PRN
  Administered 2023-09-02: 200 mL

## 2023-09-02 MED ORDER — DOCUSATE SODIUM 100 MG PO CAPS: 100.0000 mg | ORAL_CAPSULE | Freq: Two times a day (BID) | ORAL | 0 refills | Status: AC

## 2023-09-02 MED ORDER — SENNA 8.6 MG PO TABS: 2.0000 | ORAL_TABLET | Freq: Two times a day (BID) | ORAL | 0 refills | Status: AC

## 2023-09-02 MED ORDER — DEXAMETHASONE SODIUM PHOSPHATE 10 MG/ML IJ SOLN
INTRAMUSCULAR | Status: DC | PRN
  Administered 2023-09-02: 10 mg via INTRAVENOUS

## 2023-09-02 MED ORDER — FENTANYL CITRATE (PF) 100 MCG/2ML IJ SOLN
INTRAMUSCULAR | Status: AC
Start: 2023-09-02 — End: ?
  Filled 2023-09-02: qty 2

## 2023-09-02 MED ORDER — FENTANYL CITRATE (PF) 100 MCG/2ML IJ SOLN
100.0000 ug | Freq: Once | INTRAMUSCULAR | Status: AC
  Administered 2023-09-02: 100 ug via INTRAVENOUS

## 2023-09-02 MED ORDER — EPHEDRINE SULFATE (PRESSORS) 50 MG/ML IJ SOLN
INTRAMUSCULAR | Status: DC | PRN
  Administered 2023-09-02 (×4): 5 mg via INTRAVENOUS

## 2023-09-02 MED ORDER — OXYCODONE HCL 5 MG PO TABS: 5.0000 mg | ORAL_TABLET | Freq: Once | ORAL | Status: DC | PRN

## 2023-09-02 MED ORDER — FENTANYL CITRATE (PF) 100 MCG/2ML IJ SOLN
INTRAMUSCULAR | Status: DC | PRN
  Administered 2023-09-02: 50 ug via INTRAVENOUS

## 2023-09-02 MED ORDER — BUPIVACAINE LIPOSOME 1.3 % IJ SUSP
INTRAMUSCULAR | Status: AC
  Filled 2023-09-02: qty 20

## 2023-09-02 MED ORDER — VANCOMYCIN HCL 500 MG IV SOLR
INTRAVENOUS | Status: DC | PRN
  Administered 2023-09-02: 500 mg via TOPICAL

## 2023-09-02 MED ORDER — LIDOCAINE 2% (20 MG/ML) 5 ML SYRINGE
INTRAMUSCULAR | Status: AC
  Filled 2023-09-02: qty 5

## 2023-09-02 MED ORDER — DEXAMETHASONE SODIUM PHOSPHATE 10 MG/ML IJ SOLN
INTRAMUSCULAR | Status: AC
  Filled 2023-09-02: qty 1

## 2023-09-02 MED ORDER — CELECOXIB 200 MG PO CAPS
200.0000 mg | ORAL_CAPSULE | Freq: Once | ORAL | Status: AC
  Administered 2023-09-02: 200 mg via ORAL

## 2023-09-02 MED ORDER — BUPIVACAINE LIPOSOME 1.3 % IJ SUSP
INTRAMUSCULAR | Status: DC | PRN
Start: 2023-09-02 — End: 2023-09-02
  Administered 2023-09-02: 10 mL via PERINEURAL

## 2023-09-02 MED ORDER — OXYCODONE HCL 5 MG/5ML PO SOLN: 5.0000 mg | Freq: Once | ORAL | Status: DC | PRN

## 2023-09-02 MED ORDER — MIDAZOLAM HCL 2 MG/2ML IJ SOLN
INTRAMUSCULAR | Status: AC
  Filled 2023-09-02: qty 2

## 2023-09-02 MED ORDER — CEFAZOLIN SODIUM-DEXTROSE 2-4 GM/100ML-% IV SOLN
2.0000 g | INTRAVENOUS | Status: AC
  Administered 2023-09-02: 2 g via INTRAVENOUS

## 2023-09-02 MED ORDER — ACETAMINOPHEN 500 MG PO TABS
ORAL_TABLET | ORAL | Status: AC
  Filled 2023-09-02: qty 2

## 2023-09-02 MED ORDER — MEPERIDINE HCL 25 MG/ML IJ SOLN: 6.2500 mg | INTRAMUSCULAR | Status: DC | PRN

## 2023-09-02 MED ORDER — ONDANSETRON HCL 4 MG/2ML IJ SOLN
INTRAMUSCULAR | Status: AC
  Filled 2023-09-02: qty 2

## 2023-09-02 MED ORDER — CEFAZOLIN SODIUM-DEXTROSE 2-4 GM/100ML-% IV SOLN
INTRAVENOUS | Status: AC
  Filled 2023-09-02: qty 100

## 2023-09-02 MED ORDER — CELECOXIB 200 MG PO CAPS
ORAL_CAPSULE | ORAL | Status: AC
  Filled 2023-09-02: qty 1

## 2023-09-02 MED ORDER — ROPIVACAINE HCL 5 MG/ML IJ SOLN
INTRAMUSCULAR | Status: DC | PRN
  Administered 2023-09-02: 15 mL via PERINEURAL

## 2023-09-02 MED ORDER — FENTANYL CITRATE (PF) 100 MCG/2ML IJ SOLN: 25.0000 ug | INTRAMUSCULAR | Status: DC | PRN

## 2023-09-02 MED ORDER — OXYCODONE HCL 5 MG PO TABS: 5.0000 mg | ORAL_TABLET | Freq: Four times a day (QID) | ORAL | 0 refills | Status: AC | PRN

## 2023-09-02 MED ORDER — LACTATED RINGERS IV SOLN: INTRAVENOUS | Status: DC

## 2023-09-02 MED ORDER — PROPOFOL 10 MG/ML IV BOLUS
INTRAVENOUS | Status: DC | PRN
  Administered 2023-09-02: 150 mg via INTRAVENOUS

## 2023-09-02 MED ORDER — MIDAZOLAM HCL 2 MG/2ML IJ SOLN
2.0000 mg | Freq: Once | INTRAMUSCULAR | Status: AC
  Administered 2023-09-02: 2 mg via INTRAVENOUS

## 2023-09-02 MED ORDER — BUPIVACAINE HCL (PF) 0.5 % IJ SOLN
INTRAMUSCULAR | Status: DC | PRN
  Administered 2023-09-02: 10 mL via PERINEURAL

## 2023-09-02 MED ORDER — PHENYLEPHRINE HCL (PRESSORS) 10 MG/ML IV SOLN
INTRAVENOUS | Status: DC | PRN
  Administered 2023-09-02: 80 ug via INTRAVENOUS
  Administered 2023-09-02: 40 ug via INTRAVENOUS

## 2023-09-02 MED ORDER — LIDOCAINE HCL (CARDIAC) PF 100 MG/5ML IV SOSY
PREFILLED_SYRINGE | INTRAVENOUS | Status: DC | PRN
  Administered 2023-09-02: 40 mg via INTRAVENOUS

## 2023-09-02 MED ORDER — ASPIRIN 81 MG PO TBEC: 81.0000 mg | DELAYED_RELEASE_TABLET | Freq: Two times a day (BID) | ORAL | 0 refills | Status: AC

## 2023-09-02 MED ORDER — ONDANSETRON HCL 4 MG/2ML IJ SOLN: 4.0000 mg | Freq: Once | INTRAMUSCULAR | Status: DC | PRN

## 2023-09-02 MED ORDER — ACETAMINOPHEN 500 MG PO TABS
1000.0000 mg | ORAL_TABLET | Freq: Once | ORAL | Status: AC
  Administered 2023-09-02: 1000 mg via ORAL

## 2023-09-02 MED ORDER — FENTANYL CITRATE (PF) 100 MCG/2ML IJ SOLN
INTRAMUSCULAR | Status: AC
  Filled 2023-09-02: qty 2

## 2023-09-02 SURGICAL SUPPLY — 85 items
BANDAGE ESMARK 6X9 LF (GAUZE/BANDAGES/DRESSINGS) IMPLANT
BENZOIN TINCTURE PRP APPL 2/3 (GAUZE/BANDAGES/DRESSINGS) IMPLANT
BIT DRILL 2.4 AO COUPLING CANN (BIT) IMPLANT
BIT DRILL CAL 2.5 ST W/SLV (BIT) IMPLANT
BIT TREPHINE CORING 8 (BIT) IMPLANT
BLADE AVERAGE 25X9 (BLADE) IMPLANT
BLADE LONG MED 25X9 (BLADE) IMPLANT
BLADE MICRO SAGITTAL (BLADE) IMPLANT
BLADE MINI RND TIP GREEN BEAV (BLADE) IMPLANT
BLADE OSC/SAG .038X5.5 CUT EDG (BLADE) IMPLANT
BLADE SURG 15 STRL LF DISP TIS (BLADE) ×2 IMPLANT
BNDG COHESIVE 4X5 WHT NS (GAUZE/BANDAGES/DRESSINGS) IMPLANT
BNDG ELASTIC 4INX 5YD STR LF (GAUZE/BANDAGES/DRESSINGS) ×1 IMPLANT
BNDG ELASTIC 6INX 5YD STR LF (GAUZE/BANDAGES/DRESSINGS) IMPLANT
BNDG ESMARK 4X9 LF (GAUZE/BANDAGES/DRESSINGS) IMPLANT
BNDG ESMARK 6X9 LF (GAUZE/BANDAGES/DRESSINGS) IMPLANT
BNDG STRETCH GAUZE 3IN X12FT (GAUZE/BANDAGES/DRESSINGS) ×1 IMPLANT
BUR MIS CONICAL WEDGE 4.3X13 (BURR) IMPLANT
BURR MIS CONICAL WEDGE 4.3X13 (BURR) IMPLANT
CHLORAPREP W/TINT 26 (MISCELLANEOUS) ×1 IMPLANT
COVER BACK TABLE 60X90IN (DRAPES) ×1 IMPLANT
CUFF TRNQT CYL 24X4X16.5-23 (TOURNIQUET CUFF) IMPLANT
CUFF TRNQT CYL 34X4.125X (TOURNIQUET CUFF) IMPLANT
DRAPE EXTREMITY T 121X128X90 (DISPOSABLE) ×1 IMPLANT
DRAPE INCISE IOBAN 66X45 STRL (DRAPES) IMPLANT
DRAPE OEC MINIVIEW 54X84 (DRAPES) ×1 IMPLANT
DRAPE SURG 17X23 STRL (DRAPES) IMPLANT
DRAPE U-SHAPE 47X51 STRL (DRAPES) ×1 IMPLANT
DRESSING MEPILEX FLEX 4X4 (GAUZE/BANDAGES/DRESSINGS) IMPLANT
DRSG MEPILEX FLEX 4X4 (GAUZE/BANDAGES/DRESSINGS) IMPLANT
DRSG MEPITEL 4X7.2 (GAUZE/BANDAGES/DRESSINGS) ×1 IMPLANT
ELECT REM PT RETURN 9FT ADLT (ELECTROSURGICAL) ×1 IMPLANT
ELECTRODE REM PT RTRN 9FT ADLT (ELECTROSURGICAL) ×1 IMPLANT
GAUZE PAD ABD 8X10 STRL (GAUZE/BANDAGES/DRESSINGS) ×1 IMPLANT
GAUZE SPONGE 4X4 12PLY STRL (GAUZE/BANDAGES/DRESSINGS) ×1 IMPLANT
GAUZE STRETCH 2X75IN STRL (MISCELLANEOUS) IMPLANT
GLOVE BIO SURGEON STRL SZ8 (GLOVE) ×1 IMPLANT
GLOVE BIOGEL PI IND STRL 8 (GLOVE) ×2 IMPLANT
GLOVE ECLIPSE 8.0 STRL XLNG CF (GLOVE) ×1 IMPLANT
GOWN STRL REUS W/ TWL LRG LVL3 (GOWN DISPOSABLE) ×1 IMPLANT
GOWN STRL REUS W/ TWL XL LVL3 (GOWN DISPOSABLE) ×2 IMPLANT
K-WIRE DBL .054X9 NSTRL (WIRE) IMPLANT
K-WIRE TROC 1.25X150 (WIRE) ×1 IMPLANT
KIT STRATUM INSTRUMENT STD (KITS) IMPLANT
KWIRE DBL .054X9 NSTRL (WIRE) ×1 IMPLANT
KWIRE TROC 1.25X150 (WIRE) IMPLANT
NDL HYPO 22X1.5 SAFETY MO (MISCELLANEOUS) IMPLANT
NDL HYPO 25X1 1.5 SAFETY (NEEDLE) IMPLANT
NEEDLE HYPO 22X1.5 SAFETY MO (MISCELLANEOUS) IMPLANT
NEEDLE HYPO 25X1 1.5 SAFETY (NEEDLE) IMPLANT
NS IRRIG 1000ML POUR BTL (IV SOLUTION) ×1 IMPLANT
PACK BASIN DAY SURGERY FS (CUSTOM PROCEDURE TRAY) ×1 IMPLANT
PAD CAST 4YDX4 CTTN HI CHSV (CAST SUPPLIES) ×1 IMPLANT
PADDING CAST ABS COTTON 4X4 ST (CAST SUPPLIES) IMPLANT
PADDING CAST COTTON 6X4 STRL (CAST SUPPLIES) IMPLANT
PENCIL SMOKE EVACUATOR (MISCELLANEOUS) ×1 IMPLANT
PLATE SNGL LISFRANC HOLE 3 (Plate) IMPLANT
SANITIZER HAND PURELL FF 515ML (MISCELLANEOUS) ×1 IMPLANT
SCREW CANN PT 4X36 NS (Screw) IMPLANT
SCREW CANNULATED NS 4MMX36MM (Screw) ×1 IMPLANT
SCREW NONLOCK ST 3.5X28 (Screw) IMPLANT
SCREW STRATUM NL LP ST 3.5X18 (Screw) IMPLANT
SCREW STRM LOCK 3.5X14 (Screw) IMPLANT
SCREW STRM NL LP 3.5X20 (Screw) IMPLANT
SHEET MEDIUM DRAPE 40X70 STRL (DRAPES) ×1 IMPLANT
SLEEVE SCD COMPRESS KNEE MED (STOCKING) ×1 IMPLANT
SPIKE FLUID TRANSFER (MISCELLANEOUS) IMPLANT
SPLINT PLASTER CAST FAST 5X30 (CAST SUPPLIES) IMPLANT
SPONGE SURGIFOAM ABS GEL 12-7 (HEMOSTASIS) IMPLANT
SPONGE T-LAP 18X18 ~~LOC~~+RFID (SPONGE) ×1 IMPLANT
STOCKINETTE 6 STRL (DRAPES) ×1 IMPLANT
STRIP CLOSURE SKIN 1/2X4 (GAUZE/BANDAGES/DRESSINGS) IMPLANT
SUCTION TUBE FRAZIER 10FR DISP (SUCTIONS) ×1 IMPLANT
SUT ETHILON 3 0 PS 1 (SUTURE) ×1 IMPLANT
SUT MNCRL AB 3-0 PS2 18 (SUTURE) ×1 IMPLANT
SUT VIC AB 2-0 SH 18 (SUTURE) IMPLANT
SUT VIC AB 2-0 SH 27XBRD (SUTURE) IMPLANT
SUT VICRYL 0 SH 27 (SUTURE) IMPLANT
SUT VICRYL 0 UR6 27IN ABS (SUTURE) IMPLANT
SYR BULB EAR ULCER 3OZ GRN STR (SYRINGE) ×1 IMPLANT
SYR CONTROL 10ML LL (SYRINGE) IMPLANT
TOWEL GREEN STERILE FF (TOWEL DISPOSABLE) ×2 IMPLANT
TUBE CONNECTING 20X1/4 (TUBING) IMPLANT
UNDERPAD 30X36 HEAVY ABSORB (UNDERPADS AND DIAPERS) ×1 IMPLANT
YANKAUER SUCT BULB TIP NO VENT (SUCTIONS) IMPLANT

## 2023-09-02 NOTE — Anesthesia Postprocedure Evaluation (Signed)
 Anesthesia Post Note  Patient: Alvenia Aus  Procedure(s) Performed: REMOVAL, HARDWARE (Right: Foot) Revision lapidus bunion Correction (Right: Foot) PROCEDURE, BONE GRAFT (Right: Foot)     Patient location during evaluation: PACU Anesthesia Type: Regional and General Level of consciousness: awake and alert Pain management: pain level controlled Vital Signs Assessment: post-procedure vital signs reviewed and stable Respiratory status: spontaneous breathing, nonlabored ventilation, respiratory function stable and patient connected to nasal cannula oxygen Cardiovascular status: blood pressure returned to baseline and stable Postop Assessment: no apparent nausea or vomiting Anesthetic complications: no   No notable events documented.  Last Vitals:  Vitals:   09/02/23 1145 09/02/23 1153  BP: 132/84 127/76  Pulse: 74 76  Resp: 12 16  Temp:  (!) 36.2 C  SpO2: 95% 96%    Last Pain:  Vitals:   09/02/23 1153  TempSrc:   PainSc: 0-No pain                 Angalena Cousineau

## 2023-09-02 NOTE — Progress Notes (Signed)
Assisted Dr. Oddono with right, adductor canal, popliteal, ultrasound guided block. Side rails up, monitors on throughout procedure. See vital signs in flow sheet. Tolerated Procedure well. 

## 2023-09-02 NOTE — Discharge Instructions (Addendum)
 Ann Backer, MD [facility]  Please read the following information regarding your care after surgery.  Medications  You only need a prescription for the narcotic pain medicine (ex. oxycodone, Percocet, Norco).  All of the other medicines listed below are available over the counter. ? Aleve 2 pills twice a day for the first 3 days after surgery. ? acetominophen (Tylenol) 650 mg every 4-6 hours as you need for minor to moderate pain ? oxycodone as prescribed for severe pain  Narcotic pain medicine (ex. oxycodone, Percocet, Vicodin) will cause constipation.  To prevent this problem, take the following medicines while you are taking any pain medicine. ? docusate sodium (Colace) 100 mg twice a day ? senna (Senokot) 2 tablets twice a day  ? To help prevent blood clots, take a baby aspirin (81 mg) twice a day for six weeks after surgery.  You should also get up every hour while you are awake to move around.    Weight Bearing ? Do not bear any weight on the operated leg or foot.  Cast / Splint / Dressing ? Keep your splint, cast or dressing clean and dry.  Don't put anything (coat hanger, pencil, etc) down inside of it.  If it gets damp, use a hair dryer on the cool setting to dry it.  If it gets soaked, call the office to schedule an appointment for a cast change.   After your dressing, cast or splint is removed; you may shower, but do not soak or scrub the wound.  Allow the water to run over it, and then gently pat it dry.  Swelling It is normal for you to have swelling where you had surgery.  To reduce swelling and pain, keep your toes above your nose for at least 3 days after surgery.  It may be necessary to keep your foot or leg elevated for several weeks.  If it hurts, it should be elevated.  Follow Up Call my office at [phone] when you are discharged from the hospital or surgery center to schedule an appointment to be seen two weeks after surgery.  Call my office at [phone] if  you develop a fever >101.5 F, nausea, vomiting, bleeding from the surgical site or severe pain.    No Tylenol until 3pm today, if needed.

## 2023-09-02 NOTE — Anesthesia Procedure Notes (Signed)
 Anesthesia Regional Block: Adductor canal block   Pre-Anesthetic Checklist: , timeout performed,  Correct Patient, Correct Site, Correct Laterality,  Correct Procedure, Correct Position, site marked,  Risks and benefits discussed,  Surgical consent,  Pre-op evaluation,  At surgeon's request and post-op pain management  Laterality: Right  Prep: chloraprep       Needles:  Injection technique: Single-shot  Needle Type: Echogenic Stimulator Needle     Needle Length: 5cm  Needle Gauge: 22     Additional Needles:   Procedures:,,,, ultrasound used (permanent image in chart),,    Narrative:  Start time: 09/02/2023 9:05 AM End time: 09/02/2023 9:10 AM Injection made incrementally with aspirations every 5 mL.  Performed by: Personally  Anesthesiologist: Rhenda Cedars, MD  Additional Notes: Functioning IV was confirmed and monitors were applied.  A 50mm 22ga Arrow echogenic stimulator needle was used. Sterile prep and drape,hand hygiene and sterile gloves were used. Ultrasound guidance: relevant anatomy identified, needle position confirmed, local anesthetic spread visualized around nerve(s)., vascular puncture avoided.  Image printed for medical record. Negative aspiration and negative test dose prior to incremental administration of local anesthetic. The patient tolerated the procedure well.

## 2023-09-02 NOTE — H&P (Signed)
 Ann Bass is an 53 y.o. female.   Chief Complaint: Right foot pain HPI: 53 year old female without significant past medical history complains of continued pain at the right foot after attempted bunion correction about a year ago.  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  She has failed nonoperative treatment including activity modification, bone stimulator, prolonged immobilization and oral anti-inflammatories.  She presents now for surgical treatment of this painful and limiting right foot condition.  Past Medical History:  Diagnosis Date   Allergy    Ascending aorta dilatation (HCC)    41mm on MRI 02/2021 and 44 mm by 2D echo 07-2021 and 44mm by Chest MRA 08/2022   Depression    post-partum   Dilated aortic root (HCC)    41mm by chest MRI 01/2020   Dyspnea    Dysrhythmia    occational   History of gestational diabetes    Hyperlipidemia    Hypertension    Hypothyroidism    Inflammatory polyps of colon (HCC)    Liver disease    OSA (obstructive sleep apnea) 09/02/2017   Mild OSA with an AHI of 6.8/h.  On CPAP   Palpitations    PACs noted on event monitor   Pre-diabetes    updated epic hx 08-23-2019 per pt she is pre-diabetic   Splenic artery aneurysm (HCC)    12mm on MRI 02/2021    Past Surgical History:  Procedure Laterality Date   broken leg  1982   Left Femur, traction and pin in leg   BUNIONECTOMY Right 10/23/2022   COLONOSCOPY  2018   COLONOSCOPY  11/24/2022   FOOT SURGERY Right    ROBOTIC ASSISTED LAPAROSCOPIC HYSTERECTOMY AND SALPINGECTOMY Bilateral 08/24/2019   Procedure: XI ROBOTIC ASSISTED LAPAROSCOPIC HYSTERECTOMY AND SALPINGECTOMY;  Surgeon: Silverio Lay, MD;  Location: [facility] [place];  Service: Gynecology;  Laterality: Bilateral;   WISDOM TOOTH EXTRACTION  1998    Family History  Problem Relation Age of Onset   Diabetes Mother    Heart Problems Mother    Colon cancer Father 58 - 67   Cancer Father         colon   Ankylosing spondylitis Father    Healthy Sister    Healthy Sister    Cancer Paternal Grandmother        ovarian   Healthy Daughter    Healthy Daughter    Healthy Daughter    Allergic rhinitis Neg Hx    Angioedema Neg Hx    Asthma Neg Hx    Eczema Neg Hx    Immunodeficiency Neg Hx    Urticaria Neg Hx    Rectal cancer Neg Hx    Stomach cancer Neg Hx    Esophageal cancer Neg Hx    Social History:  reports that she has never smoked. She has never been exposed to tobacco smoke. She has never used smokeless tobacco. She reports current alcohol use. She reports that she does not use drugs.  Allergies:  Allergies  Allergen Reactions   Penicillins Rash   Doxycycline Rash    Facility-Administered Medications Prior to Admission  Medication Dose Route Frequency Provider Last Rate Last Admin   omalizumab Geoffry Paradise) injection 300 mg  300 mg Subcutaneous Q28 days Alfonse Spruce, MD   300 mg at 08/24/23 1059   Medications Prior to Admission  Medication Sig Dispense Refill   Ascorbic Acid (VITAMIN C) 1000 MG tablet Take 1,000 mg by mouth daily.  cholecalciferol (VITAMIN D3) 25 MCG (1000 UNIT) tablet Take 1,000 Units by mouth daily.     fexofenadine (ALLEGRA) 180 MG tablet Take 180 mg by mouth daily.     FLUoxetine (PROZAC) 20 MG tablet Take 1 tablet (20 mg total) by mouth daily. Take in addition to 40mg  capsule for total of 60mg  daily 90 tablet 1   FLUoxetine (PROZAC) 40 MG capsule Take 1 capsule (40 mg total) by mouth daily. 90 capsule 1   Magnesium Oxide 400 MG CAPS Take 400 mg by mouth at bedtime.     metoprolol succinate (TOPROL XL) 25 MG 24 hr tablet Take 1 tablet (25 mg total) by mouth daily. 90 tablet 3   Multiple Vitamin (MULTIVITAMIN) tablet Take 1 tablet by mouth daily.     Omega-3 Fatty Acids (FISH OIL) 1000 MG CAPS Take 1,000 mg by mouth daily.      rosuvastatin (CRESTOR) 40 MG tablet TAKE 1 TABLET BY MOUTH EVERY DAY 90 tablet 1   clobetasol (TEMOVATE) 0.05 %  external solution Apply 1 Application topically 2 (two) times daily. 50 mL 0   diazepam (VALIUM) 2 MG tablet Take 1 tablet (2 mg total) by mouth every 8 (eight) hours as needed for anxiety. 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 (Patient taking differently: as needed. 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 (Patient taking differently: Apply 1 application  topically as needed.) 80 g 1   XOLAIR 150 MG/ML prefilled syringe INJECT 2 SYRINGES UNDER THE SKIN EVERY 4 WEEKS 2 mL 11    No results found for this or any previous visit (from the past 48 hours). No results found.  Review of Systems no recent fever, chills, nausea, vomiting or changes in her appetite  Height 5\' 2"  (1.575 m), weight 69.9 kg, last menstrual period 10/27/2017. Physical Exam  Well-nourished well-developed woman in no apparent distress.  Alert and oriented.  Normal mood and affect.  Gait is antalgic to the right.  The right foot has a healed surgical incision dorsally over the midfoot.  No signs of infection.  Pulses are palpable in the foot.  Intact sensibility to light touch in the saphenous nerve distribution.  5 out of 5 strength in plantarflexion and dorsiflexion of the ankle.  Pain with passive pronation and supination of the forefoot.   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  The risks and benefits of the alternative treatment options have been discussed in detail.  The patient wishes to proceed  with surgery and specifically understands risks of bleeding, infection, nerve damage, blood clots, need for additional surgery, amputation and death.   Amada Backer, MD 10/02/2023, 8:35 AM

## 2023-09-02 NOTE — Anesthesia Procedure Notes (Signed)
 Procedure Name: LMA Insertion Date/Time: 09/02/2023 9:30 AM  Performed by: Arvilla Birmingham, CRNAPre-anesthesia Checklist: Patient identified, Emergency Drugs available, Suction available and Patient being monitored Patient Re-evaluated:Patient Re-evaluated prior to induction Oxygen Delivery Method: Circle system utilized Preoxygenation: Pre-oxygenation with 100% oxygen Induction Type: IV induction Ventilation: Mask ventilation without difficulty LMA: LMA inserted LMA Size: 4.0 Number of attempts: 1 Airway Equipment and Method: Bite block Placement Confirmation: positive ETCO2 Tube secured with: Tape Dental Injury: Teeth and Oropharynx as per pre-operative assessment

## 2023-09-02 NOTE — Anesthesia Procedure Notes (Signed)
 Anesthesia Regional Block: Popliteal block   Pre-Anesthetic Checklist: , timeout performed,  Correct Patient, Correct Site, Correct Laterality,  Correct Procedure, Correct Position, site marked,  Risks and benefits discussed,  Surgical consent,  Pre-op evaluation,  At surgeon's request and post-op pain management  Laterality: Right  Prep: chloraprep       Needles:  Injection technique: Single-shot  Needle Type: Echogenic Stimulator Needle     Needle Length: 5cm  Needle Gauge: 22     Additional Needles:   Procedures:, nerve stimulator,,, ultrasound used (permanent image in chart),,     Nerve Stimulator or Paresthesia:  Response: foot, 0.45 mA  Additional Responses:   Narrative:  Start time: 09/02/2023 9:00 AM End time: 09/02/2023 9:05 AM Injection made incrementally with aspirations every 5 mL.  Performed by: Personally  Anesthesiologist: Rhenda Cedars, MD  Additional Notes: Functioning IV was confirmed and monitors were applied.  A 50mm 22ga Arrow echogenic stimulator needle was used. Sterile prep and drape,hand hygiene and sterile gloves were used. Ultrasound guidance: relevant anatomy identified, needle position confirmed, local anesthetic spread visualized around nerve(s)., vascular puncture avoided.  Image printed for medical record. Negative aspiration and negative test dose prior to incremental administration of local anesthetic. The patient tolerated the procedure well.

## 2023-09-02 NOTE — Transfer of Care (Signed)
 Immediate Anesthesia Transfer of Care Note  Patient: Alvenia Aus  Procedure(s) Performed: REMOVAL, HARDWARE (Right: Foot) Revision lapidus bunion Correction (Right: Foot) PROCEDURE, BONE GRAFT (Right: Foot)  Patient Location: PACU  Anesthesia Type:General  Level of Consciousness: sedated  Airway & Oxygen Therapy: Patient Spontanous Breathing and Patient connected to face mask oxygen  Post-op Assessment: Report given to RN and Post -op Vital signs reviewed and stable  Post vital signs: Reviewed and stable  Last Vitals:  Vitals Value Taken Time  BP 122/79 09/02/23 1101  Temp    Pulse 68 09/02/23 1106  Resp 17 09/02/23 1106  SpO2 100 % 09/02/23 1106  Vitals shown include unfiled device data.  Last Pain:  Vitals:   09/02/23 0902  TempSrc:   PainSc: 0-No pain      Patients Stated Pain Goal: 3 (09/02/23 0845)  Complications: No notable events documented.

## 2023-09-02 NOTE — Op Note (Signed)
 09/02/2023  10:56 AM  PATIENT:  Ann Bass  53 y.o. female  PRE-OPERATIVE DIAGNOSIS:   1.  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  Right foot medial cuneiform painful hardware      3.  Right foot 1st MT painful hardware  POST-OPERATIVE DIAGNOSIS:  same  Procedure(s): 1.  Removal of deep implants right foot 1st MT   2.  Removal of deep implants right foot medial cuneiform   3.  Revision right lapidus 1st TMT joint arthrodesis   4.  AP, lateral and oblique radiographs of the right foot   5.  Large autograft from the calcaneus to the 1st TMT joint  SURGEON:  Toni Arthurs, MD  ASSISTANT: Alfredo Martinez, PA-C  ANESTHESIA:   General, regional  EBL:  minimal   TOURNIQUET:   Total Tourniquet Time Documented: Thigh (Right) - 67 minutes Total: Thigh (Right) - 67 minutes  COMPLICATIONS:  None apparent  DISPOSITION:  Extubated, awake and stable to recovery.  INDICATION FOR PROCEDURE: 53 year old female without significant past medical history presents with a nonunion of the first tarsometatarsal joint after Lapidus bunion correction over a year ago.  She also has painful hardware at the first metatarsal and medial cuneiform.  She has failed nonoperative treatment and presents today for removal of hardware and revision Lapidus bunion correction.  The risks and benefits of the alternative treatment options have been discussed in detail.  The patient wishes to proceed with surgery and specifically understands risks of bleeding, infection, nerve damage, blood clots, need for additional surgery, amputation and death.   PROCEDURE IN DETAIL:  After pre operative consent was obtained, and the correct operative site was identified, the patient was brought to the operating room and placed supine on the OR table.  Anesthesia was administered.  Pre-operative antibiotics were administered.  A surgical timeout was taken.  The right lower extremity was prepped and draped in standard sterile  fashion with a tourniquet around the thigh.  The extremity was elevated, and the tourniquet was inflated to 250 mmHg.  The previous dorsal medial midfoot incision was identified.  It was opened again sharply and dissection carried down through the subcutaneous tissues.  The EHL tendon was protected throughout the case.  Subperiosteal dissection was carried dorsal to the first metatarsal and medial cuneiform.  The dorsal staple was identified.  It was mobilized from the underlying bone with an osteotome.  It was removed with a needle driver.  Subperiosteal dissection was then carried medially to the first TMT joint.  The more medial staple was identified.  The staple was freed of all overgrown bone with a small osteotome.  It was then removed with the osteotome and a rondure.  The compression screw was then identified within the joint.  The TMT joint was opened with a lamina spreader.  The broken fragment of screw was removed from within the TMT joint with a rondure.  The rondure was then used to remove all review remaining fibrous tissue from within the joint.  The first metatarsal base was subluxated medially.  Scar tissue within the webspace between the base of the 1st and 2nd metatarsals was resected with a rondure.  This allowed reduction of the first metatarsal base to an appropriate position at the medial cuneiform.  The wound was irrigated copiously.  A small drill bit was used to perforate both sides of the joint leaving the resultant bone graft in place.  Attention was turned to the  lateral heel.  An oblique incision was made over the tuberosity of the calcaneus.  An 8 mm trephine was used to harvest several cc of cancellous bone.  This was morselized on the back table.  The calcaneus wound was irrigated and packed with Gelfoam.  The incision was closed with nylon.  Attention was returned to the dorsal midfoot.  All of the bone graft was packed into the first TMT joint.  The joint was then reduced  and provisionally pinned.  The guidepin was inserted from the dorsal aspect of the medial cuneiform and advanced across to the lateral base of the first metatarsal.  AP and lateral radiographs confirmed appropriate reduction of the TMT joint and position of the guidepin.  The guidepin was then overdrilled.  A 4 mm [employer] partially-threaded titanium cannulated screw was inserted.  It was noted to compress the joint appropriately.  Radiographs showed appropriate correction of the intermetatarsal angle.  An AP radiograph of the forefoot showed appropriate correction of the bunion deformity.  A 4-hole [employer] stratum Lisfranc plate was selected.  The template was placed over the joint and drilled proximally.  Radiographs confirmed appropriate position of the template.  The template and pins were removed.  The plate was inserted and impacted into position proximally.  The plate was further secured with a 3.5 mm fully threaded nonlocking screw through the most proximal hole in the medial cuneiform.  Distally the compression slot was drilled and a compression pin inserted.  The compression pin was tightened further compressing the TMT joint.  The remaining 2 holes were drilled and filled with a bicortical locking screw and a bicortical nonlocking screw.  The compression pin was then removed and replaced with a bicortical nonlocking screw.  Final AP, oblique and lateral radiographs confirmed appropriate correction of the bunion deformity and arthrodesis of the first TMT joint.  Interval hardware removal was noted from the first metatarsal and medial cuneiform.  The fragment of the screw remained in the middle cuneiform but was completely within the bone.  The dorsal wound was irrigated copiously and sprinkled with vancomycin powder.  The periosteum was repaired over the first TMT joint with 0 Vicryl figure-of-eight sutures.  The skin incision was then closed with a running 3-0 nylon.  Sterile dressings were  applied followed by a well-padded short leg splint.  The tourniquet was released after application of the dressings.  The patient was awakened from anesthesia and transported to the recovery room in stable condition.   FOLLOW UP PLAN: Nonweightbearing on the right lower extremity.  Follow-up in the office in 2 weeks for suture removal and conversion to a short leg cast.  Aspirin for DVT prophylaxis.   RADIOGRAPHS: AP, oblique and lateral radiographs of the right foot are obtained intraoperatively.  These show interval removal of hardware from the medial cuneiform and first metatarsal as well as correction of the bunion deformity with first TMT joint arthrodesis.  Hardware is appropriately positioned and of the appropriate lengths.  No other acute injuries are noted.    Alfredo Martinez PA-C was present and scrubbed for the duration of the operative case. His assistance was essential in positioning the patient, prepping and draping, gaining and maintaining exposure, performing the operation, closing and dressing the wounds and applying the splint.

## 2023-09-03 ENCOUNTER — Encounter (HOSPITAL_BASED_OUTPATIENT_CLINIC_OR_DEPARTMENT_OTHER): Payer: Self-pay | Admitting: Orthopedic Surgery

## 2023-09-14 ENCOUNTER — Other Ambulatory Visit: Payer: Self-pay | Admitting: Cardiology

## 2023-09-16 ENCOUNTER — Encounter: Payer: 59 | Admitting: Podiatry

## 2023-10-05 ENCOUNTER — Encounter: Payer: 59 | Admitting: Podiatry

## 2023-10-26 ENCOUNTER — Ambulatory Visit

## 2023-10-26 DIAGNOSIS — L501 Idiopathic urticaria: Secondary | ICD-10-CM

## 2023-11-22 ENCOUNTER — Ambulatory Visit

## 2023-11-22 DIAGNOSIS — L501 Idiopathic urticaria: Secondary | ICD-10-CM | POA: Diagnosis not present

## 2023-11-30 DIAGNOSIS — R29898 Other symptoms and signs involving the musculoskeletal system: Secondary | ICD-10-CM | POA: Insufficient documentation

## 2023-11-30 DIAGNOSIS — M79671 Pain in right foot: Secondary | ICD-10-CM | POA: Insufficient documentation

## 2023-12-20 ENCOUNTER — Ambulatory Visit

## 2023-12-20 DIAGNOSIS — L501 Idiopathic urticaria: Secondary | ICD-10-CM

## 2023-12-22 ENCOUNTER — Other Ambulatory Visit: Payer: Self-pay | Admitting: Cardiology

## 2023-12-22 ENCOUNTER — Other Ambulatory Visit: Payer: Self-pay | Admitting: Family Medicine

## 2023-12-24 ENCOUNTER — Encounter: Payer: Self-pay | Admitting: Family Medicine

## 2023-12-24 ENCOUNTER — Other Ambulatory Visit (HOSPITAL_COMMUNITY): Payer: Self-pay

## 2023-12-24 ENCOUNTER — Other Ambulatory Visit: Payer: Self-pay

## 2023-12-24 ENCOUNTER — Ambulatory Visit: Payer: 59 | Admitting: Family Medicine

## 2023-12-24 VITALS — BP 110/70 | HR 56 | Temp 97.6°F | Ht 62.0 in | Wt 167.0 lb

## 2023-12-24 DIAGNOSIS — R7302 Impaired glucose tolerance (oral): Secondary | ICD-10-CM | POA: Diagnosis not present

## 2023-12-24 DIAGNOSIS — E785 Hyperlipidemia, unspecified: Secondary | ICD-10-CM | POA: Diagnosis not present

## 2023-12-24 DIAGNOSIS — Z0000 Encounter for general adult medical examination without abnormal findings: Secondary | ICD-10-CM | POA: Diagnosis not present

## 2023-12-24 DIAGNOSIS — Z114 Encounter for screening for human immunodeficiency virus [HIV]: Secondary | ICD-10-CM | POA: Diagnosis not present

## 2023-12-24 DIAGNOSIS — E7800 Pure hypercholesterolemia, unspecified: Secondary | ICD-10-CM

## 2023-12-24 DIAGNOSIS — E559 Vitamin D deficiency, unspecified: Secondary | ICD-10-CM | POA: Diagnosis not present

## 2023-12-24 DIAGNOSIS — Z1159 Encounter for screening for other viral diseases: Secondary | ICD-10-CM

## 2023-12-24 LAB — HEPATIC FUNCTION PANEL
ALT: 36 U/L — ABNORMAL HIGH (ref 0–35)
AST: 26 U/L (ref 0–37)
Albumin: 4.5 g/dL (ref 3.5–5.2)
Alkaline Phosphatase: 61 U/L (ref 39–117)
Bilirubin, Direct: 0.2 mg/dL (ref 0.0–0.3)
Total Bilirubin: 0.7 mg/dL (ref 0.2–1.2)
Total Protein: 6.6 g/dL (ref 6.0–8.3)

## 2023-12-24 LAB — CBC WITH DIFFERENTIAL/PLATELET
Basophils Absolute: 0 K/uL (ref 0.0–0.1)
Basophils Relative: 0.7 % (ref 0.0–3.0)
Eosinophils Absolute: 0.3 K/uL (ref 0.0–0.7)
Eosinophils Relative: 4.8 % (ref 0.0–5.0)
HCT: 45.6 % (ref 36.0–46.0)
Hemoglobin: 15.4 g/dL — ABNORMAL HIGH (ref 12.0–15.0)
Lymphocytes Relative: 20.3 % (ref 12.0–46.0)
Lymphs Abs: 1.2 K/uL (ref 0.7–4.0)
MCHC: 33.7 g/dL (ref 30.0–36.0)
MCV: 89.1 fl (ref 78.0–100.0)
Monocytes Absolute: 0.4 K/uL (ref 0.1–1.0)
Monocytes Relative: 6.3 % (ref 3.0–12.0)
Neutro Abs: 3.9 K/uL (ref 1.4–7.7)
Neutrophils Relative %: 67.9 % (ref 43.0–77.0)
Platelets: 275 K/uL (ref 150.0–400.0)
RBC: 5.11 Mil/uL (ref 3.87–5.11)
RDW: 13.1 % (ref 11.5–15.5)
WBC: 5.8 K/uL (ref 4.0–10.5)

## 2023-12-24 LAB — LIPID PANEL
Cholesterol: 115 mg/dL (ref 0–200)
HDL: 42.7 mg/dL (ref >39.00)
LDL Cholesterol: 51 mg/dL (ref 0–99)
NonHDL: 72.18
Total CHOL/HDL Ratio: 3
Triglycerides: 104 mg/dL (ref 0.0–149.0)
VLDL: 20.8 mg/dL (ref 0.0–40.0)

## 2023-12-24 LAB — BASIC METABOLIC PANEL WITH GFR
BUN: 8 mg/dL (ref 6–23)
CO2: 27 meq/L (ref 19–32)
Calcium: 9.4 mg/dL (ref 8.4–10.5)
Chloride: 101 meq/L (ref 96–112)
Creatinine, Ser: 0.51 mg/dL (ref 0.40–1.20)
GFR: 106.99 mL/min (ref >60.00)
Glucose, Bld: 97 mg/dL (ref 70–99)
Potassium: 4.5 meq/L (ref 3.5–5.1)
Sodium: 138 meq/L (ref 135–145)

## 2023-12-24 LAB — HEMOGLOBIN A1C: Hgb A1c MFr Bld: 6 % (ref 4.6–6.5)

## 2023-12-24 LAB — VITAMIN D 25 HYDROXY (VIT D DEFICIENCY, FRACTURES): VITD: 43.09 ng/mL (ref 30.00–100.00)

## 2023-12-24 LAB — TSH: TSH: 2.46 u[IU]/mL (ref 0.35–5.50)

## 2023-12-24 MED ORDER — ROSUVASTATIN CALCIUM 40 MG PO TABS
40.0000 mg | ORAL_TABLET | Freq: Every day | ORAL | 0 refills | Status: DC
  Filled 2023-12-24: qty 30, 30d supply, fill #0

## 2023-12-24 NOTE — Patient Instructions (Addendum)
 Follow up in in 6 months to recheck cholesterol and BP We'll notify you of your lab results and make any changes if needed Continue to work on healthy diet and regular exercise- you can do it!! Call with any questions or concerns Stay Safe!  Stay Healthy! GOOD LUCK WITH BACK TO SCHOOL!!!

## 2023-12-24 NOTE — Assessment & Plan Note (Signed)
 Pt's PE WNL.  UTD on pap, mammo, colonoscopy, Tdap.  Declines other immunizations.  Check labs.  Anticipatory guidance provided.

## 2023-12-24 NOTE — Progress Notes (Signed)
   Subjective:    Patient ID: Ann Bass, female    DOB: 1971/02/21, 53 y.o.   MRN: [id number]  HPI CPE- UTD on mammo, pap, colonoscopy, Tdap.  Patient Care Team    Relationship Specialty Notifications Start End  Mahlon Comer BRAVO, MD PCP - General Family Medicine  02/22/13   Shlomo Wilbert SAUNDERS, MD PCP - Cardiology Cardiology  07/25/19   Darcel Pool, MD Consulting Physician Obstetrics and Gynecology  09/13/14   Roetta Blunt, MD Referring Physician Family Medicine  09/13/14   Shlomo Wilbert SAUNDERS, MD Consulting Physician Cardiology  02/18/17     Health Maintenance  Topic Date Due   HIV Screening  Never done   Hepatitis C Screening  Never done   Hepatitis B Vaccines (1 of 3 - 19+ 3-dose series) Never done   Pneumococcal Vaccine: 50+ Years (1 of 1 - PCV) Never done   COVID-19 Vaccine (6 - 2024-25 season) 01/17/2023   INFLUENZA VACCINE  12/17/2023   MAMMOGRAM  01/20/2024   Cervical Cancer Screening (HPV/Pap Cotest)  06/13/2024   DTaP/Tdap/Td (2 - Td or Tdap) 09/12/2024   Colonoscopy  11/24/2027   Zoster Vaccines- Shingrix   Completed   HPV VACCINES  Aged Out   Meningococcal B Vaccine  Aged Out      Review of Systems Patient reports no vision/ hearing changes, adenopathy,fever, weight change,  persistant/recurrent hoarseness , swallowing issues, chest pain, palpitations, edema, persistant/recurrent cough, hemoptysis, dyspnea (rest/exertional/paroxysmal nocturnal), gastrointestinal bleeding (melena, rectal bleeding), abdominal pain, significant heartburn, bowel changes, GU symptoms (dysuria, hematuria, incontinence), Gyn symptoms (abnormal  bleeding, pain),  syncope, focal weakness, memory loss, numbness & tingling, skin/hair/nail changes, abnormal bruising or bleeding, anxiety, or depression.      Objective:   Physical Exam General Appearance:    Alert, cooperative, no distress, appears stated age  Head:    Normocephalic, without obvious abnormality, atraumatic  Eyes:    PERRL,  conjunctiva/corneas clear, EOM's intact both eyes  Ears:    Normal TM's and external ear canals, both ears  Nose:   Nares normal, septum midline, mucosa normal, no drainage    or sinus tenderness  Throat:   Lips, mucosa, and tongue normal; teeth and gums normal  Neck:   Supple, symmetrical, trachea midline, no adenopathy;    Thyroid : no enlargement/tenderness/nodules  Back:     Symmetric, no curvature, ROM normal, no CVA tenderness  Lungs:     Clear to auscultation bilaterally, respirations unlabored  Chest Wall:    No tenderness or deformity   Heart:    Regular rate and rhythm, S1 and S2 normal, no murmur, rub   or gallop  Breast Exam:    Deferred to GYN  Abdomen:     Soft, non-tender, bowel sounds active all four quadrants,    no masses, no organomegaly  Genitalia:    Deferred to GYN  Rectal:    Extremities:   Extremities normal, atraumatic, no cyanosis or edema  Pulses:   2+ and symmetric all extremities  Skin:   Skin color, texture, turgor normal, no rashes or lesions  Lymph nodes:   Cervical, supraclavicular, and axillary nodes normal  Neurologic:   CNII-XII intact, normal strength, sensation and reflexes    throughout          Assessment & Plan:

## 2023-12-24 NOTE — Addendum Note (Signed)
 Addended by: Domingue Coltrain E on: 12/24/2023 08:32 AM   Modules accepted: Orders

## 2023-12-25 LAB — HIV ANTIBODY (ROUTINE TESTING W REFLEX): HIV 1&2 Ab, 4th Generation: NONREACTIVE

## 2023-12-25 LAB — HEPATITIS C ANTIBODY: Hepatitis C Ab: NONREACTIVE

## 2023-12-27 ENCOUNTER — Ambulatory Visit: Payer: Self-pay | Admitting: Family Medicine

## 2023-12-28 NOTE — Progress Notes (Signed)
 Pt has been notified.

## 2024-01-03 ENCOUNTER — Encounter: Payer: Self-pay | Admitting: Cardiology

## 2024-01-03 ENCOUNTER — Other Ambulatory Visit: Payer: Self-pay

## 2024-01-03 DIAGNOSIS — G4733 Obstructive sleep apnea (adult) (pediatric): Secondary | ICD-10-CM

## 2024-01-03 NOTE — Progress Notes (Signed)
 MC to patient to advise that referral was sent to Dr. Oneil Forget dentist.

## 2024-01-04 ENCOUNTER — Other Ambulatory Visit (HOSPITAL_COMMUNITY): Payer: Self-pay

## 2024-01-05 ENCOUNTER — Other Ambulatory Visit: Payer: Self-pay

## 2024-01-05 ENCOUNTER — Telehealth: Payer: Self-pay | Admitting: Cardiology

## 2024-01-05 DIAGNOSIS — I77810 Thoracic aortic ectasia: Secondary | ICD-10-CM

## 2024-01-05 NOTE — Telephone Encounter (Signed)
  Per MyChart scheduling message:  I am due for a yearly follow up visit with Dr. Shlomo to check on the status of my AAA. I also believe she will want me to have some imaging done, perhaps an MRI. Can you please verify?   Patient is scheduled on 04/06/24 with Dr Shlomo

## 2024-01-12 ENCOUNTER — Ambulatory Visit (HOSPITAL_COMMUNITY)
Admission: RE | Admit: 2024-01-12 | Discharge: 2024-01-12 | Disposition: A | Discharge status: Home / Self Care | Source: Ambulatory Visit | Attending: Cardiology | Admitting: Cardiology

## 2024-01-12 DIAGNOSIS — I77810 Thoracic aortic ectasia: Secondary | ICD-10-CM | POA: Insufficient documentation

## 2024-01-12 MED ORDER — GADOBUTROL 1 MMOL/ML IV SOLN
7.5000 mL | Freq: Once | INTRAVENOUS | Status: AC | PRN
  Administered 2024-01-12: 7.5 mL via INTRAVENOUS

## 2024-01-13 NOTE — Progress Notes (Signed)
 Office Visit Note  Patient: Ann Bass             Date of Birth: 1970/09/20           MRN: [id number]             PCP: Mahlon Comer BRAVO, MD Referring: Mahlon Comer BRAVO, MD Visit Date: 01/27/2024 Occupation: @GUAROCC @  Subjective:  Pain in multiple joints  History of Present Illness: Ann Bass is a 53 y.o. female with sicca symptoms, polyarthralgia and fatigue.  She returns today after her last visit in March 2025.  She continues to have dry mouth and dry eyes symptoms.  She states she uses eyedrops.  She has tried Biotene but does not like to use it.  She denies any shortness of breath, palpitations or lymphadenopathy.  She had right foot reconstruction in June 2024 by Dr. Kit.  She has minimal discomfort now and her foot is doing much better.   She continues to have neck ,SI joints and lower back discomfort.  No history of plantar fasciitis, Achilles tendinitis no uveitis.    Activities of Daily Living:  Patient reports morning stiffness for 5-10 minutes.   Patient Denies nocturnal pain.  Difficulty dressing/grooming: Denies Difficulty climbing stairs: Reports Difficulty getting out of chair: Denies Difficulty using hands for taps, buttons, cutlery, and/or writing: Denies  Review of Systems  Constitutional:  Positive for fatigue.  HENT:  Positive for mouth dryness. Negative for mouth sores.   Eyes:  Positive for dryness.  Respiratory:  Positive for shortness of breath.   Cardiovascular:  Negative for chest pain and palpitations.  Gastrointestinal:  Positive for constipation. Negative for blood in stool and diarrhea.  Endocrine: Negative for increased urination.  Genitourinary:  Negative for involuntary urination.  Musculoskeletal:  Positive for muscle weakness and morning stiffness. Negative for joint pain, gait problem, joint pain, joint swelling, myalgias, muscle tenderness and myalgias.  Skin:  Positive for sensitivity to sunlight. Negative for color  change, rash and hair loss.  Allergic/Immunologic: Negative for susceptible to infections.  Neurological:  Negative for dizziness and headaches.  Hematological:  Negative for swollen glands.  Psychiatric/Behavioral:  Positive for depressed mood and sleep disturbance. The patient is nervous/anxious.     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   Anxiety 01/27/2023   Hyperlipidemia 05/05/2022   Fatigue 05/05/2022   Upper airway cough syndrome 07/14/2021   Chronic rhinitis 07/14/2021   Splenic artery aneurysm (HCC) 03/04/2021   Allergic contact dermatitis 08/09/2020   Steatosis of liver 01/13/2019   Renal cyst, left 01/13/2019   Overweight (BMI 25.0-29.9) 01/13/2019   OSA (obstructive sleep apnea) 09/02/2017   Ascending aorta dilatation (HCC)    SOB (shortness of breath) 02/18/2017   Excessive daytime sleepiness 02/18/2017   Vitamin D  deficiency 09/17/2016   Physical exam 09/13/2014   Palpitations 09/13/2014   Decreased hearing of both ears 09/13/2014   Glucose intolerance (impaired glucose tolerance) 02/22/2013   Depression 02/22/2013   Hypothyroid 02/22/2013    Past Medical History:  Diagnosis Date   Allergy     Ascending aorta dilatation (HCC)    4.2-4.3cm by MRI/MRA 12/2023   Depression    post-partum   Dilated aortic root (HCC)    41mm by chest MRI 01/2020   Dyspnea  Dysrhythmia    occational   History of gestational diabetes    Hyperlipidemia    Hypertension    Hypothyroidism    Inflammatory polyps of colon (HCC)    Liver disease    OSA (obstructive sleep apnea) 09/02/2017   Mild OSA with an AHI of 6.8/h.  On CPAP   Palpitations    PACs noted on event monitor   Pre-diabetes    updated epic hx 08-23-2019 per pt she is pre-diabetic   Splenic artery aneurysm  (HCC)    12mm on MRI 02/2021    Family History  Problem Relation Age of Onset   Diabetes Mother    Heart Problems Mother    Colon cancer Father 80 - 49   Cancer Father        colon   Ankylosing spondylitis Father    Healthy Sister    Healthy Sister    Cancer Paternal Grandmother        ovarian   Healthy Daughter    Healthy Daughter    Healthy Daughter    Allergic rhinitis Neg Hx    Angioedema Neg Hx    Asthma Neg Hx    Eczema Neg Hx    Immunodeficiency Neg Hx    Urticaria Neg Hx    Rectal cancer Neg Hx    Stomach cancer Neg Hx    Esophageal cancer Neg Hx    Past Surgical History:  Procedure Laterality Date   broken leg  1982   Left Femur, traction and pin in leg   BUNIONECTOMY Right 10/23/2022   BUNIONECTOMY Right 09/02/2023   Procedure: Revision lapidus bunion Correction;  Surgeon: Kit Rush, MD;  Location: [facility];  Service: Orthopedics;  Laterality: Right;  revision lapidus buinion correction   COLONOSCOPY  2018   COLONOSCOPY  11/24/2022   FOOT SURGERY Right    HARDWARE REMOVAL Right 09/02/2023   Procedure: REMOVAL, HARDWARE;  Surgeon: Kit Rush, MD;  Location: [facility];  Service: Orthopedics;  Laterality: Right;  1st metatarsal and medial cuneiform   HARVEST BONE GRAFT Right 09/02/2023   Procedure: PROCEDURE, BONE GRAFT;  Surgeon: Kit Rush, MD;  Location: [facility];  Service: Orthopedics;  Laterality: Right;  autograft from calcaneus   ROBOTIC ASSISTED LAPAROSCOPIC HYSTERECTOMY AND SALPINGECTOMY Bilateral 08/24/2019   Procedure: XI ROBOTIC ASSISTED LAPAROSCOPIC HYSTERECTOMY AND SALPINGECTOMY;  Surgeon: Darcel Pool, MD;  Location: [facility] ;  Service: Gynecology;  Laterality: Bilateral;   WISDOM TOOTH EXTRACTION  1998   Social History   Social History Narrative   Not on file   Immunization History  Administered Date(s) Administered   Influenza Inj Mdck Quad Pf 03/30/2017   Influenza,  Quadrivalent, Recombinant, Inj, Pf 04/21/2018   Influenza, Seasonal, Injecte, Preservative Fre 01/27/2023   Influenza,inj,Quad PF,6+ Mos 05/25/2014, 01/13/2019, 04/17/2020, 05/06/2021, 05/05/2022   PFIZER Comirnaty(Gray Top)Covid-19 Tri-Sucrose Vaccine 12/12/2020   PFIZER(Purple Top)SARS-COV-2 Vaccination 10/24/2019, 11/14/2019   Tdap 09/13/2014   Unspecified SARS-COV-2 Vaccination 10/17/2019, 11/16/2019   Zoster Recombinant(Shingrix ) 05/22/2022, 09/04/2022     Objective: Vital Signs: BP 129/88   Pulse 61   Temp 98.7 F (37.1 C)   Resp 16   Ht 5' 2 (1.575 m)   Wt 155 lb 6.4 oz (70.5 kg)   LMP 10/27/2017   BMI 28.42 kg/m    Physical Exam Vitals and nursing note reviewed.  Constitutional:      Appearance: She is well-developed.  HENT:     Head: Normocephalic and atraumatic.  Eyes:     Conjunctiva/sclera: Conjunctivae normal.  Cardiovascular:     Rate and Rhythm: Normal rate and regular rhythm.     Heart sounds: Normal heart sounds.  Pulmonary:     Effort: Pulmonary effort is normal.     Breath sounds: Normal breath sounds.  Abdominal:     General: Bowel sounds are normal.     Palpations: Abdomen is soft.  Musculoskeletal:     Cervical back: Normal range of motion.  Lymphadenopathy:     Cervical: No cervical adenopathy.  Skin:    General: Skin is warm and dry.     Capillary Refill: Capillary refill takes less than 2 seconds.  Neurological:     Mental Status: She is alert and oriented to person, place, and time.  Psychiatric:        Behavior: Behavior normal.      Musculoskeletal Exam: Cervical, thoracic and lumbar spine were in good range of motion.  She has some tenderness over the lower lumbar region.  There was no SI joint tenderness.  Shoulder joints, elbow joints, wrist joints, MCPs, PIPs and DIPs were in good range of motion with no synovitis.  Hip joints and knee joints were in good range of motion without any warmth swelling or effusion.  There was no  tenderness over ankles or MTPs.  She had hypermobility in most of her joints.   CDAI Exam: CDAI Score: -- Patient Global: --; Provider Global: -- Swollen: --; Tender: -- Joint Exam 01/27/2024   No joint exam has been documented for this visit   There is currently no information documented on the homunculus. Go to the Rheumatology activity and complete the homunculus joint exam.  Investigation: No additional findings.  Imaging: MR Angiogram Chest W Wo Contrast Result Date: 01/14/2024 CLINICAL DATA:  Follow-up of dilated ascending thoracic aorta. EXAM: MRA CHEST WITH OR WITHOUT CONTRAST TECHNIQUE: Angiographic images of the chest were obtained using MRA technique with intravenous contrast. CONTRAST:  7.5mL GADAVIST  GADOBUTROL  1 MMOL/ML IV SOLN COMPARISON:  CTA chest 12/09/2022 FINDINGS: VASCULAR Aorta: The aortic root is normal in caliber measuring approximately 3.7 cm at the level of the sinuses of Valsalva. The ascending thoracic aorta measures approximately 4.2-4.3 cm in maximum diameter by MRI/MRA. This may be very minimally increasing in size over time. Repeat measurements on the 2020 CTA are approximately up to 4.1 cm. No evidence of aortic dissection. Visualized proximal great vessels demonstrate normal patency and branching anatomy. Heart: Normal heart size.  No pericardial fluid identified. Pulmonary Arteries:  Normal caliber central pulmonary arteries. NON-VASCULAR No incidental masses, lymphadenopathy or pleural fluid. No bony abnormalities identified. IMPRESSION: The ascending thoracic aorta measures approximately 4.2-4.3 cm in maximum diameter by MRI/MRA. This may be very minimally increasing in size over time. Repeat measurements on the 2020 CTA are approximately 4.1 cm. Recommend annual imaging followup by CTA or MRA. This recommendation follows 2010 ACCF/AHA/AATS/ACR/ASA/SCA/SCAI/SIR/STS/SVM Guidelines for the Diagnosis and Management of Patients with Thoracic Aortic Disease.  Circulation. 2010; 121: Z733-z630. Aortic aneurysm NOS (ICD10-I71.9) Electronically Signed   By: Marcey Moan M.D.   On: 01/14/2024 18:10    Recent Labs: Lab Results  Component Value Date   WBC 5.8 12/24/2023   HGB 15.4 (H) 12/24/2023   PLT 275.0 12/24/2023   NA 138 12/24/2023   K 4.5 12/24/2023   CL 101 12/24/2023   CO2 27 12/24/2023   GLUCOSE 97 12/24/2023   BUN 8 12/24/2023   CREATININE 0.51 12/24/2023   BILITOT 0.7  12/24/2023   ALKPHOS 61 12/24/2023   AST 26 12/24/2023   ALT 36 (H) 12/24/2023   PROT 6.6 12/24/2023   ALBUMIN 4.5 12/24/2023   CALCIUM  9.4 12/24/2023   GFRAA 122 12/22/2019    Speciality Comments: No specialty comments available.  Procedures:  No procedures performed Allergies: Penicillins and Doxycycline    Assessment / Plan:     Visit Diagnoses: Sicca syndrome (HCC) - ANA 1: 80 NS, NH, cytoplasmic, ENA negative, complements normal.  She continues to have dry mouth and dry eye symptoms.  Over-the-counter products were discussed at length.  Patient has no clinical features of autoimmune disease.  Positive ANA (antinuclear antibody) -ENA panel was negative in the past.  Plan: Protein / creatinine ratio, urine, ANA, Anti-DNA antibody, double-stranded, C3 and C4, Sedimentation rate  HLA B27 positive -she had no SI joint tenderness on the examination today.  She continues to have some lower back discomfort.  X-rays were unremarkable of the cervical spine.  SI joint x-rays showed some osteoarthritic changes.  Neck pain-intermittent discomfort.  She good range of motion without discomfort.  Chronic SI joint pain -she has no tenderness over the SI joints.  Previous x-rays showed osteitis condensans ilii.  Chronic midline low back pain without sciatica -she continues to have some discomfort in her lower back.  She has some tenderness in the lower paravertebral region.  A handout on back exercises was given.  Chronic pain of both knees -she denies discomfort  today.  She states the pain is usually intermittent.  X-rays of bilateral knee joints obtained were unremarkable.  Pes planus of both feet-using shoes with arch support were discussed.  S/P bunionectomy - Right October 23, 2022 by Dr. Silva.  She had foot reconstruction surgery by Dr. Kit and gradually improved.  She has minimal discomfort now.  Family history of ankylosing spondylitis-father  Hypermobility of joint-she has hypermobility in most of her joints.  Isometric exercises were discussed.  Myofascial pain-she continues to have some generalized pain and discomfort.  Need for regular exercise and stretching yoga and water aerobics were discussed.  Other fatigue-she continues to have fatigue.  Patient also has chronic insomnia.  Good sleep hygiene was discussed.  Other medical problems are listed as follows:  Brain fog  Ascending aorta dilatation (HCC) - Followed by Dr. Shlomo and Dr. Serene  Steatosis of liver  Splenic artery aneurysm [facility])  History of hypothyroidism  Vitamin D  deficiency-vitamin D  was normal at 43.09 on December 24, 2023.  History of hyperlipidemia  History of depression  OSA (obstructive sleep apnea)  Orders: Orders Placed This Encounter  Procedures   Protein / creatinine ratio, urine   ANA   Anti-DNA antibody, double-stranded   C3 and C4   Sedimentation rate   No orders of the defined types were placed in this encounter.    Follow-Up Instructions: Return in about 1 year (around 01/26/2025) for Sicca syndrome, osteoarthritis.   Maya Nash, MD  Note - This record has been created using Animal nutritionist.  Chart creation errors have been sought, but may not always  have been located. Such creation errors do not reflect on  the standard of medical care.

## 2024-01-15 ENCOUNTER — Encounter: Payer: Self-pay | Admitting: Cardiology

## 2024-01-15 ENCOUNTER — Ambulatory Visit: Payer: Self-pay | Admitting: Cardiology

## 2024-01-15 DIAGNOSIS — I77810 Thoracic aortic ectasia: Secondary | ICD-10-CM

## 2024-01-16 ENCOUNTER — Other Ambulatory Visit: Payer: Self-pay | Admitting: Cardiology

## 2024-01-18 ENCOUNTER — Ambulatory Visit

## 2024-01-18 ENCOUNTER — Telehealth: Payer: Self-pay | Admitting: Cardiology

## 2024-01-18 DIAGNOSIS — L501 Idiopathic urticaria: Secondary | ICD-10-CM

## 2024-01-18 NOTE — Telephone Encounter (Signed)
 Call to patient to discuss chest MRA results. No answer, left message with no identifiers asking recipient to call our office.

## 2024-01-18 NOTE — Telephone Encounter (Signed)
 Manuelita -Dr. Oneil Forget calling requesting to get sleep study report. She gave fax number [phone]

## 2024-01-18 NOTE — Telephone Encounter (Signed)
 Call to patient to advise Chest MRA with slight increase in ascending aorta dimensions since 2020 now measuring 4.2-4.3cm (was 4.4cm in 2024 MRI).  Patient verbalizes understanding and agrees to repeat Chest MRI/MRA in 1 year.

## 2024-01-18 NOTE — Telephone Encounter (Signed)
-----   Message from Wilbert Bihari sent at 01/15/2024  1:47 PM EDT ----- Chest MRA with slight increase in ascending aorta dimensions since 2020 now measuring 4.2-4.3cm (was 4.4cm in 2024 MRI).  Repeat Chest MRI/MRA in 1 year ----- Message ----- From: Interface, Rad Results In Sent: 01/14/2024   6:12 PM EDT To: Wilbert JONELLE Bihari, MD

## 2024-01-18 NOTE — Addendum Note (Signed)
 Addended by: JANIT GENI CROME on: 01/18/2024 02:46 PM   Modules accepted: Orders

## 2024-01-18 NOTE — Telephone Encounter (Signed)
-----   Message from Ann Bass sent at 01/15/2024  1:47 PM EDT ----- Chest MRA with slight increase in ascending aorta dimensions since 2020 now measuring 4.2-4.3cm (was 4.4cm in 2024 MRI).  Repeat Chest MRI/MRA in 1 year ----- Message ----- From: Interface, Rad Results In Sent: 01/14/2024   6:12 PM EDT To: Ann JONELLE Bihari, MD

## 2024-01-20 ENCOUNTER — Other Ambulatory Visit: Payer: Self-pay | Admitting: Obstetrics and Gynecology

## 2024-01-20 DIAGNOSIS — Z1231 Encounter for screening mammogram for malignant neoplasm of breast: Secondary | ICD-10-CM

## 2024-01-21 ENCOUNTER — Encounter: Payer: Self-pay | Admitting: Cardiology

## 2024-01-21 ENCOUNTER — Encounter: Payer: Self-pay | Admitting: Family Medicine

## 2024-01-27 ENCOUNTER — Encounter: Payer: Self-pay | Admitting: Rheumatology

## 2024-01-27 ENCOUNTER — Ambulatory Visit: Attending: Rheumatology | Admitting: Rheumatology

## 2024-01-27 VITALS — BP 129/88 | HR 61 | Temp 98.7°F | Resp 16 | Ht 62.0 in | Wt 155.4 lb

## 2024-01-27 DIAGNOSIS — Z1589 Genetic susceptibility to other disease: Secondary | ICD-10-CM | POA: Diagnosis not present

## 2024-01-27 DIAGNOSIS — M542 Cervicalgia: Secondary | ICD-10-CM

## 2024-01-27 DIAGNOSIS — R5383 Other fatigue: Secondary | ICD-10-CM

## 2024-01-27 DIAGNOSIS — M7918 Myalgia, other site: Secondary | ICD-10-CM

## 2024-01-27 DIAGNOSIS — I728 Aneurysm of other specified arteries: Secondary | ICD-10-CM

## 2024-01-27 DIAGNOSIS — Z9889 Other specified postprocedural states: Secondary | ICD-10-CM

## 2024-01-27 DIAGNOSIS — I77810 Thoracic aortic ectasia: Secondary | ICD-10-CM

## 2024-01-27 DIAGNOSIS — R768 Other specified abnormal immunological findings in serum: Secondary | ICD-10-CM | POA: Diagnosis not present

## 2024-01-27 DIAGNOSIS — M533 Sacrococcygeal disorders, not elsewhere classified: Secondary | ICD-10-CM

## 2024-01-27 DIAGNOSIS — M2142 Flat foot [pes planus] (acquired), left foot: Secondary | ICD-10-CM

## 2024-01-27 DIAGNOSIS — K760 Fatty (change of) liver, not elsewhere classified: Secondary | ICD-10-CM

## 2024-01-27 DIAGNOSIS — M25562 Pain in left knee: Secondary | ICD-10-CM

## 2024-01-27 DIAGNOSIS — Z8659 Personal history of other mental and behavioral disorders: Secondary | ICD-10-CM

## 2024-01-27 DIAGNOSIS — M545 Low back pain, unspecified: Secondary | ICD-10-CM

## 2024-01-27 DIAGNOSIS — Z780 Asymptomatic menopausal state: Secondary | ICD-10-CM

## 2024-01-27 DIAGNOSIS — G4733 Obstructive sleep apnea (adult) (pediatric): Secondary | ICD-10-CM

## 2024-01-27 DIAGNOSIS — Z13820 Encounter for screening for osteoporosis: Secondary | ICD-10-CM

## 2024-01-27 DIAGNOSIS — R4189 Other symptoms and signs involving cognitive functions and awareness: Secondary | ICD-10-CM

## 2024-01-27 DIAGNOSIS — E559 Vitamin D deficiency, unspecified: Secondary | ICD-10-CM

## 2024-01-27 DIAGNOSIS — Z8639 Personal history of other endocrine, nutritional and metabolic disease: Secondary | ICD-10-CM

## 2024-01-27 DIAGNOSIS — M249 Joint derangement, unspecified: Secondary | ICD-10-CM

## 2024-01-27 DIAGNOSIS — G8929 Other chronic pain: Secondary | ICD-10-CM

## 2024-01-27 DIAGNOSIS — M25561 Pain in right knee: Secondary | ICD-10-CM

## 2024-01-27 DIAGNOSIS — M3500 Sicca syndrome, unspecified: Secondary | ICD-10-CM | POA: Diagnosis not present

## 2024-01-27 DIAGNOSIS — Z8269 Family history of other diseases of the musculoskeletal system and connective tissue: Secondary | ICD-10-CM

## 2024-01-27 DIAGNOSIS — M2141 Flat foot [pes planus] (acquired), right foot: Secondary | ICD-10-CM

## 2024-01-27 NOTE — Patient Instructions (Signed)
 Low Back Sprain or Strain Rehab Ask your health care provider which exercises are safe for you. Do exercises exactly as told by your health care provider and adjust them as directed. It is normal to feel mild stretching, pulling, tightness, or discomfort as you do these exercises. Stop right away if you feel sudden pain or your pain gets worse. Do not begin these exercises until told by your health care provider. Stretching and range-of-motion exercises These exercises warm up your muscles and joints and improve the movement and flexibility of your back. These exercises also help to relieve pain, numbness, and tingling. Lumbar rotation  Lie on your back on a firm bed or the floor with your knees bent. Straighten your arms out to your sides so each arm forms a 90-degree angle (right angle) with a side of your body. Slowly move (rotate) both of your knees to one side of your body until you feel a stretch in your lower back (lumbar). Try not to let your shoulders lift off the floor. Hold this position for __________ seconds. Tense your abdominal muscles and slowly move your knees back to the starting position. Repeat this exercise on the other side of your body. Repeat __________ times. Complete this exercise __________ times a day. Single knee to chest  Lie on your back on a firm bed or the floor with both legs straight. Bend one of your knees. Use your hands to move your knee up toward your chest until you feel a gentle stretch in your lower back and buttock. Hold your leg in this position by holding on to the front of your knee. Keep your other leg as straight as possible. Hold this position for __________ seconds. Slowly return to the starting position. Repeat with your other leg. Repeat __________ times. Complete this exercise __________ times a day. Prone extension on elbows  Lie on your abdomen on a firm bed or the floor (prone position). Prop yourself up on your elbows. Use your arms  to help lift your chest up until you feel a gentle stretch in your abdomen and your lower back. This will place some of your body weight on your elbows. If this is uncomfortable, try stacking pillows under your chest. Your hips should stay down, against the surface that you are lying on. Keep your hip and back muscles relaxed. Hold this position for __________ seconds. Slowly relax your upper body and return to the starting position. Repeat __________ times. Complete this exercise __________ times a day. Strengthening exercises These exercises build strength and endurance in your back. Endurance is the ability to use your muscles for a long time, even after they get tired. Pelvic tilt This exercise strengthens the muscles that lie deep in the abdomen. Lie on your back on a firm bed or the floor with your legs extended. Bend your knees so they are pointing toward the ceiling and your feet are flat on the floor. Tighten your lower abdominal muscles to press your lower back against the floor. This motion will tilt your pelvis so your tailbone points up toward the ceiling instead of pointing to your feet or the floor. To help with this exercise, you may place a small towel under your lower back and try to push your back into the towel. Hold this position for __________ seconds. Let your muscles relax completely before you repeat this exercise. Repeat __________ times. Complete this exercise __________ times a day. Alternating arm and leg raises  Get on your hands  and knees on a firm surface. If you are on a hard floor, you may want to use padding, such as an exercise mat, to cushion your knees. Line up your arms and legs. Your hands should be directly below your shoulders, and your knees should be directly below your hips. Lift your left leg behind you. At the same time, raise your right arm and straighten it in front of you. Do not lift your leg higher than your hip. Do not lift your arm higher  than your shoulder. Keep your abdominal and back muscles tight. Keep your hips facing the ground. Do not arch your back. Keep your balance carefully, and do not hold your breath. Hold this position for __________ seconds. Slowly return to the starting position. Repeat with your right leg and your left arm. Repeat __________ times. Complete this exercise __________ times a day. Abdominal set with straight leg raise  Lie on your back on a firm bed or the floor. Bend one of your knees and keep your other leg straight. Tense your abdominal muscles and lift your straight leg up, 4-6 inches (10-15 cm) off the ground. Keep your abdominal muscles tight and hold this position for __________ seconds. Do not hold your breath. Do not arch your back. Keep it flat against the ground. Keep your abdominal muscles tense as you slowly lower your leg back to the starting position. Repeat with your other leg. Repeat __________ times. Complete this exercise __________ times a day. Single leg lower with bent knees Lie on your back on a firm bed or the floor. Tense your abdominal muscles and lift your feet off the floor, one foot at a time, so your knees and hips are bent in 90-degree angles (right angles). Your knees should be over your hips and your lower legs should be parallel to the floor. Keeping your abdominal muscles tense and your knee bent, slowly lower one of your legs so your toe touches the ground. Lift your leg back up to return to the starting position. Do not hold your breath. Do not let your back arch. Keep your back flat against the ground. Repeat with your other leg. Repeat __________ times. Complete this exercise __________ times a day. Posture and body mechanics Good posture and healthy body mechanics can help to relieve stress in your body's tissues and joints. Body mechanics refers to the movements and positions of your body while you do your daily activities. Posture is part of body  mechanics. Good posture means: Your spine is in its natural S-curve position (neutral). Your shoulders are pulled back slightly. Your head is not tipped forward (neutral). Follow these guidelines to improve your posture and body mechanics in your everyday activities. Standing  When standing, keep your spine neutral and your feet about hip-width apart. Keep a slight bend in your knees. Your ears, shoulders, and hips should line up. When you do a task in which you stand in one place for a long time, place one foot up on a stable object that is 2-4 inches (5-10 cm) high, such as a footstool. This helps keep your spine neutral. Sitting  When sitting, keep your spine neutral and keep your feet flat on the floor. Use a footrest, if necessary, and keep your thighs parallel to the floor. Avoid rounding your shoulders, and avoid tilting your head forward. When working at a desk or a computer, keep your desk at a height where your hands are slightly lower than your elbows. Slide your  chair under your desk so you are close enough to maintain good posture. When working at a computer, place your monitor at a height where you are looking straight ahead and you do not have to tilt your head forward or downward to look at the screen. Resting When lying down and resting, avoid positions that are most painful for you. If you have pain with activities such as sitting, bending, stooping, or squatting, lie in a position in which your body does not bend very much. For example, avoid curling up on your side with your arms and knees near your chest (fetal position). If you have pain with activities such as standing for a long time or reaching with your arms, lie with your spine in a neutral position and bend your knees slightly. Try the following positions: Lying on your side with a pillow between your knees. Lying on your back with a pillow under your knees. Lifting  When lifting objects, keep your feet at least  shoulder-width apart and tighten your abdominal muscles. Bend your knees and hips and keep your spine neutral. It is important to lift using the strength of your legs, not your back. Do not lock your knees straight out. Always ask for help to lift heavy or awkward objects. This information is not intended to replace advice given to you by your health care provider. Make sure you discuss any questions you have with your health care provider. Document Revised: 09/07/2022 Document Reviewed: 07/22/2020 Elsevier Patient Education  2024 [employer].

## 2024-01-30 LAB — PROTEIN / CREATININE RATIO, URINE
Creatinine, Urine: 51 mg/dL (ref 20–275)
Protein/Creat Ratio: 78 mg/g{creat} (ref 24–184)
Protein/Creatinine Ratio: 0.078 mg/mg{creat} (ref 0.024–0.184)
Total Protein, Urine: 4 mg/dL — ABNORMAL LOW (ref 5–24)

## 2024-01-30 LAB — ANTI-NUCLEAR AB-TITER (ANA TITER)
ANA TITER: 1:80 {titer} — ABNORMAL HIGH
ANA Titer 1: 1:80 {titer} — ABNORMAL HIGH

## 2024-01-30 LAB — ANTI-DNA ANTIBODY, DOUBLE-STRANDED: ds DNA Ab: <1 [IU]/mL

## 2024-01-30 LAB — C3 AND C4
C3 Complement: 150 mg/dL (ref 83–193)
C4 Complement: 39 mg/dL (ref 15–57)

## 2024-01-30 LAB — SEDIMENTATION RATE: Sed Rate: 2 mm/h (ref 0–30)

## 2024-01-30 LAB — ANA: Anti Nuclear Antibody (ANA): POSITIVE — AB

## 2024-01-31 ENCOUNTER — Ambulatory Visit: Payer: Self-pay | Admitting: Rheumatology

## 2024-01-31 NOTE — Progress Notes (Signed)
 ANA remains positive, double-stranded DNA negative, complements normal, sed rate normal, urine protein creatinine ratio is normal.  No change in treatment advised.

## 2024-01-31 NOTE — Progress Notes (Signed)
 I discussed lab results with the patient.  She has low titer ANA which is not significant.  She does not have lupus at this time and her test for Sjogren's were negative in the past.  She also has myofascial pain syndrome and hypermobility.  Patient voiced understanding.

## 2024-02-01 ENCOUNTER — Ambulatory Visit
Admission: RE | Admit: 2024-02-01 | Discharge: 2024-02-01 | Disposition: A | Discharge status: Home / Self Care | Source: Ambulatory Visit | Attending: Obstetrics and Gynecology | Admitting: Obstetrics and Gynecology

## 2024-02-01 DIAGNOSIS — Z1231 Encounter for screening mammogram for malignant neoplasm of breast: Secondary | ICD-10-CM

## 2024-02-08 NOTE — Telephone Encounter (Signed)
 Sleep study has been faxed back to [facility] at Dr [facility]icky.

## 2024-02-15 ENCOUNTER — Ambulatory Visit (INDEPENDENT_AMBULATORY_CARE_PROVIDER_SITE_OTHER)

## 2024-02-15 DIAGNOSIS — L501 Idiopathic urticaria: Secondary | ICD-10-CM | POA: Diagnosis not present

## 2024-03-27 ENCOUNTER — Ambulatory Visit (INDEPENDENT_AMBULATORY_CARE_PROVIDER_SITE_OTHER)

## 2024-03-27 ENCOUNTER — Ambulatory Visit

## 2024-03-27 DIAGNOSIS — L501 Idiopathic urticaria: Secondary | ICD-10-CM | POA: Diagnosis not present

## 2024-03-30 ENCOUNTER — Telehealth: Payer: Self-pay

## 2024-03-30 ENCOUNTER — Other Ambulatory Visit: Payer: Self-pay | Admitting: Family Medicine

## 2024-04-05 IMAGING — CT CTA CHEST W/ AND/OR W/O CM W/ OR W/O DISSECTION AND GATING
1 of 9 series · 3 of 16 positions shown, 4 images · IV contrast (agent unspecified)
Comparison: MRA 03/04/2021 and previous

CLINICAL DATA: Thoracic aortic dissection, follow-up. Ascending
aortic dilatation.

EXAM:
CT ANGIOGRAPHY CHEST WITH CONTRAST
TECHNIQUE: Multidetector CT imaging of the chest was performed using the
standard protocol during bolus administration of intravenous
contrast. Multiplanar CT image reconstructions and MIPs were
obtained to evaluate the vascular anatomy.

[Series 9: arterial thins · axial · arterial · 0.70mm/px · z∈[+1220,+1370]mm · 3 of 499 slices shown, 4 images]
[im 125/499  soft-tissue]
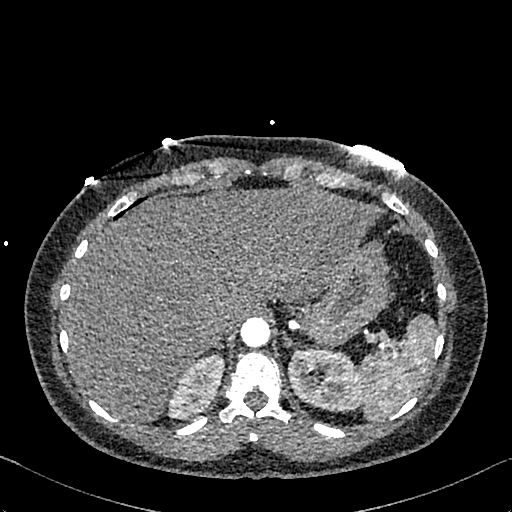
[im 125/499  bone]
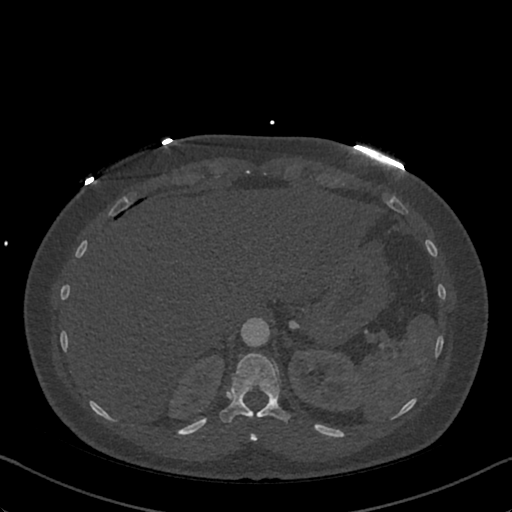
[im 250/499  soft-tissue]
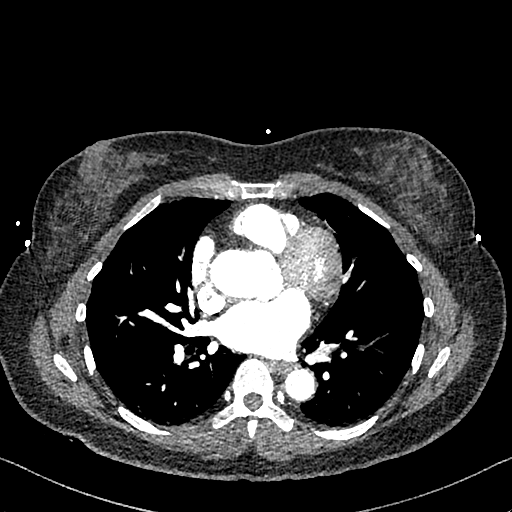
[im 374/499  soft-tissue]
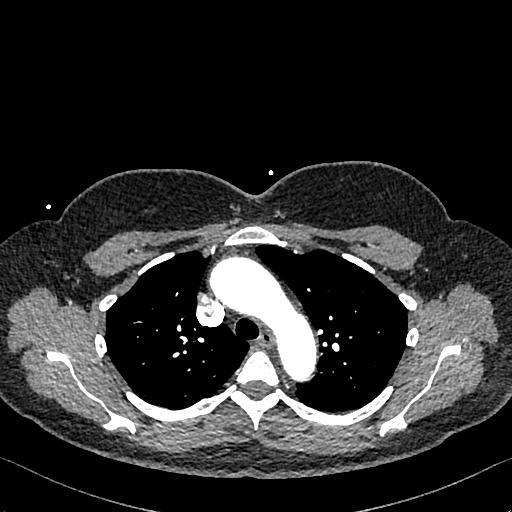

[3 of 16 positions shown; findings below may reference images not displayed]

RADIATION DOSE REDUCTION: This exam was performed according to the
departmental dose-optimization program which includes automated
exposure control, adjustment of the mA and/or kV according to
patient size and/or use of iterative reconstruction technique.

CONTRAST:  75mL OMNIPAQUE IOHEXOL 350 MG/ML SOLN
FINDINGS: Cardiovascular: Heart size normal. No pericardial effusion. The RV
is nondilated. Satisfactory opacification of pulmonary arteries
noted, and there is no evidence of pulmonary emboli. Some images are
degraded by breathing motion. Good contrast opacification of the
thoracic aorta. No dissection or stenosis. Classic 3 vessel
brachiocephalic arterial origin anatomy without proximal stenosis.

Aortic Root:

--Valve: 2.2 cm

--Sinuses: 3.7 cm

--Sinotubular Junction: 3.0 cm

Limitations by motion: Mild

Thoracic Aorta:

--Ascending Aorta: 4.2 cm

--Aortic Arch: 3.6 cm

--Descending Aorta: 2.5 cm

Visualized proximal abdominal aorta unremarkable.

Mediastinum/Nodes: No hematoma, mass or adenopathy.

Lungs/Pleura: No pleural effusion.  Lungs are clear.

Upper Abdomen: 4 cm left upper pole renal cyst stable since
10/28/2017; no follow-up recommended. No acute findings.

Musculoskeletal: No chest wall abnormality. No acute or significant
osseous findings.

Review of the MIP images confirms the above findings.
IMPRESSION: 1. Stable thoracic aortic aneurysm, 4.2 cm ascending and 3.6 cm
arch. Recommend annual imaging followup by CTA or MRA. This
recommendation follows 6606
ACCF/AHA/AATS/ACR/ASA/SCA/BKRY/DULCE MARIA/HOSTETLER/TOTOR Guidelines for the
Diagnosis and Management of Patients with Thoracic Aortic Disease.
Circulation.6606; 121: [id number]
2. No acute findings.

## 2024-04-05 NOTE — Progress Notes (Unsigned)
 Date:  02/07/2020   ID:  Ann Bass, DOB [birth year], MRN [id number]  Date:  04/06/2024   ID:  Ann Bass, DOB October 19, 1970, MRN [id number]  PCP:  Mahlon Comer BRAVO, MD  Cardiologist:  Wilbert Bihari, MD    Referring MD: Mahlon Comer BRAVO, MD   Chief Complaint  Patient presents with   Follow-up    Obstructive sleep apnea, dilated ascending aorta, PACs, hyperlipidemia, splenic artery aneurysm, DOE, hypertension    History of Present Illness:    Ann Bass is a 53 y.o. female with a hx of palpitations and shortness of breath.  2D echocardiogram showed normal LV function with mildly dilated a sending aorta at 39 mmHg otherwise normal .  Exercise stress test noted showed no inducible ischemia.  Event monitor was normal except for a rare PAC.  She also has mild obstructive sleep apnea with an AHI of 6.8/hr.  Lowest oxygen saturation drop was 82%.  She is on CPAP.   She is here today for follow-up and is doing well.  She denies any chest pain or pressure, SOB, DOE, PND, orthopnea, lower extremity edema, dizziness, palpitations or syncope.  She is doing well with her PAP device and thinks that she has gotten used to it.  She has problems with insomnia half way through the night and then takes it off and does not put it back on.  She also was traveling this past month and did not take it with her. She tolerates the mask and feels the pressure is adequate.  Since going on PAP she feels rested in the am and has no significant daytime sleepiness.  She denies any significant  nasal dryness or nasal congestion.  She has moderate mouth dryness and does not use a chin strap with her nasal pillow mask. She does not think that she snores. An Epworth Sleepiness Scale score was calculated the office today and this endorsed at 12 arguing for some residual daytime sleepiness. Patient denies any episodes of bruxism, No gagging hallucinations or cataplectic events.    Past Medical History:   Diagnosis Date   Allergy     Ascending aorta dilatation    4.2-4.3cm by MRI/MRA 12/2023   Depression    post-partum   Dilated aortic root    41mm by chest MRI 01/2020   Dyspnea    Dysrhythmia    occational   History of gestational diabetes    Hyperlipidemia    Hypertension    Hypothyroidism    Inflammatory polyps of colon (HCC)    Liver disease    OSA (obstructive sleep apnea) 09/02/2017   Mild OSA with an AHI of 6.8/h.  On CPAP   Palpitations    PACs noted on event monitor   Pre-diabetes    updated epic hx 08-23-2019 per pt she is pre-diabetic   Splenic artery aneurysm    12mm on MRI 02/2021    Past Surgical History:  Procedure Laterality Date   broken leg  1982   Left Femur, traction and pin in leg   BUNIONECTOMY Right 10/23/2022   BUNIONECTOMY Right 09/02/2023   Procedure: Revision lapidus bunion Correction;  Surgeon: Kit Rush, MD;  Location: [facility];  Service: Orthopedics;  Laterality: Right;  revision lapidus buinion correction   COLONOSCOPY  2018   COLONOSCOPY  11/24/2022   FOOT SURGERY Right    HARDWARE REMOVAL Right 09/02/2023   Procedure: REMOVAL, HARDWARE;  Surgeon: Kit Rush, MD;  Location: [facility];  Service: Orthopedics;  Laterality: Right;  1st metatarsal and medial cuneiform   HARVEST BONE GRAFT Right 09/02/2023   Procedure: PROCEDURE, BONE GRAFT;  Surgeon: Kit Rush, MD;  Location: [facility];  Service: Orthopedics;  Laterality: Right;  autograft from calcaneus   ROBOTIC ASSISTED LAPAROSCOPIC HYSTERECTOMY AND SALPINGECTOMY Bilateral 08/24/2019   Procedure: XI ROBOTIC ASSISTED LAPAROSCOPIC HYSTERECTOMY AND SALPINGECTOMY;  Surgeon: Darcel Pool, MD;  Location: [facility];  Service: Gynecology;  Laterality: Bilateral;   WISDOM TOOTH EXTRACTION  1998    Current Medications: Current Meds  Medication Sig   Ascorbic Acid (VITAMIN C) 1000 MG tablet Take 1,000 mg by mouth daily.    cholecalciferol (VITAMIN D3) 25 MCG (1000 UNIT) tablet Take 1,000 Units by mouth daily.   diazepam  (VALIUM ) 2 MG tablet Take 1 tablet (2 mg total) by mouth every 8 (eight) hours as needed for anxiety.   EPINEPHrine  0.3 mg/0.3 mL IJ SOAJ injection    fexofenadine (ALLEGRA) 180 MG tablet Take 180 mg by mouth daily.   FLUoxetine  (PROZAC ) 20 MG capsule TAKE 1 CAPSULE (20 MG TOTAL) DAILY. TAKE IN ADDITION TO 40MG  CAPSULE FOR TOTAL OF 60MG  DAILY   FLUoxetine  (PROZAC ) 40 MG capsule Take 1 capsule (40 mg total) by mouth daily.   levocetirizine (XYZAL ) 5 MG tablet Take a tablet 1-2 times daily during the spring/summer to keep the hives at bay in combination with your Allegra daily.   Magnesium Oxide 400 MG CAPS Take 400 mg by mouth at bedtime.   metoprolol  succinate (TOPROL -XL) 25 MG 24 hr tablet TAKE 1 TABLET (25 MG TOTAL) BY MOUTH DAILY.   Multiple Vitamin (MULTIVITAMIN) tablet Take 1 tablet by mouth daily.   Omega-3 Fatty Acids (FISH OIL) 1000 MG CAPS Take 1,000 mg by mouth daily.    tirzepatide (ZEPBOUND) 2.5 MG/0.5ML injection vial Inject 2.5 mg into the skin once a week.   XOLAIR  150 MG/ML prefilled syringe INJECT 2 SYRINGES UNDER THE SKIN EVERY 4 WEEKS   Current Facility-Administered Medications for the 04/06/24 encounter (Office Visit) with Shlomo Wilbert SAUNDERS, MD  Medication   omalizumab  (XOLAIR ) injection 300 mg     Allergies:   Penicillins and Doxycycline    Social History   Socioeconomic History   Marital status: Married    Spouse name: Not on file   Number of children: Not on file   Years of education: Not on file   Highest education level: Bachelor's degree (e.g., BA, AB, BS)  Occupational History   Not on file  Tobacco Use   Smoking status: Never    Passive exposure: Never   Smokeless tobacco: Never  Vaping Use   Vaping status: Never Used  Substance and Sexual Activity   Alcohol use: Yes    Comment: rare   Drug use: No   Sexual activity: Yes    Birth control/protection:  Surgical  Other Topics Concern   Not on file  Social History Narrative   Not on file   Social Drivers of Health   Financial Resource Strain: Low Risk  (12/23/2023)   Overall Financial Resource Strain (CARDIA)    Difficulty of Paying Living Expenses: Not hard at all  Food Insecurity: No Food Insecurity (12/23/2023)   Hunger Vital Sign    Worried About Running Out of Food in the Last Year: Never true    Ran Out of Food in the Last Year: Never true  Transportation Needs: No Transportation Needs (12/23/2023)   PRAPARE - Transportation  Lack of Transportation (Medical): No    Lack of Transportation (Non-Medical): No  Physical Activity: Insufficiently Active (12/23/2023)   Exercise Vital Sign    Days of Exercise per Week: 2 days    Minutes of Exercise per Session: 30 min  Stress: Stress Concern Present (12/23/2023)   [employer] of Occupational Health - Occupational Stress Questionnaire    Feeling of Stress: To some extent  Social Connections: Moderately Integrated (12/23/2023)   Social Connection and Isolation Panel    Frequency of Communication with Friends and Family: Never    Frequency of Social Gatherings with Friends and Family: Once a week    Attends Religious Services: More than 4 times per year    Active Member of [employer] or Organizations: Yes    Attends Engineer, Structural: More than 4 times per year    Marital Status: Married     Family History: The patient's family history includes Ankylosing spondylitis in her father; Cancer in her father and paternal grandmother; Colon cancer (age of onset: 33 64 49) in her father; Diabetes in her mother; Healthy in her daughter, daughter, daughter, sister, and sister; Heart Problems in her mother. There is no history of Allergic rhinitis, Angioedema, Asthma, Eczema, Immunodeficiency, Urticaria, Rectal cancer, Stomach cancer, or Esophageal cancer.  ROS:   Please see the history of present illness.    ROS  All other systems  reviewed and negative.   EKGs/Labs/Other Studies Reviewed:    The following studies were reviewed today: PAP compliance download from [facility]        Recent Labs: 12/24/2023: ALT 36; BUN 8; Creatinine, Ser 0.51; Hemoglobin 15.4; Platelets 275.0; Potassium 4.5; Sodium 138; TSH 2.46   Recent Lipid Panel    Component Value Date/Time   CHOL 115 12/24/2023 0834   CHOL 109 10/28/2020 1035   TRIG 104.0 12/24/2023 0834   HDL 42.70 12/24/2023 0834   HDL 38 (L) 10/28/2020 1035   CHOLHDL 3 12/24/2023 0834   VLDL 20.8 12/24/2023 0834   LDLCALC 51 12/24/2023 0834   LDLCALC 53 10/28/2020 1035   LDLDIRECT 155.0 01/13/2019 1208    Physical Exam:    VS:  BP 112/86   Pulse 67   Ht 5' 2 (1.575 m)   Wt 139 lb 6.4 oz (63.2 kg)   LMP 10/27/2017   SpO2 98%   BMI 25.50 kg/m     Wt Readings from Last 3 Encounters:  04/06/24 139 lb 6.4 oz (63.2 kg)  01/27/24 155 lb 6.4 oz (70.5 kg)  12/24/23 167 lb (75.8 kg)  GEN: Well nourished, well developed in no acute distress HEENT: Normal NECK: No JVD; No carotid bruits LYMPHATICS: No lymphadenopathy CARDIAC:RRR, no murmurs, rubs, gallops RESPIRATORY:  Clear to auscultation without rales, wheezing or rhonchi  ABDOMEN: Soft, non-tender, non-distended MUSCULOSKELETAL:  No edema; No deformity  SKIN: Warm and dry NEUROLOGIC:  Alert and oriented x 3 PSYCHIATRIC:  Normal affect   ASSESSMENT:    1. OSA (obstructive sleep apnea)   2. Ascending aortic aneurysm (HCC)   3. PAC (premature atrial contraction)    PLAN:    In order of problems listed above:  OSA - The patient is tolerating PAP therapy well without any problems. The PAP download performed by his DME was personally reviewed and interpreted by me today and showed an AHI of 0.3 /hr on auto CPAP from 4-15 cm H2O with 30% compliance in using more than 4 hours nightly.  The patient has been using and benefiting  from PAP use and will continue to benefit from therapy.  -her compliance was down  as she took at trip and did not take her device with her -encouraged her to be more compliant with her device   Dilated ascending aorta -echo and Chest CTA 12/2018 showed ascending aorta dimension around 42mm. -cardiac MRI 01/2021 showed stable ascending aortic aneurysm at 4.1cm -2D echo 08/04/2021 and cardiac MRI 08/2022 with ascending aorta 4.4 cm -cMRI 01/12/2024: Aortic root 3.7 cm at the sinus of Valsalva and ascending thoracic aorta 4.2 to 4.3 cm in maximum diameter -Repeat cMRI in 1 year -BP goal < 130/19mmHg  PACs -Denies palpitations -Continue Toprol  XL 25 mg daily with as needed refills  HLD -LDL goal < 70  -I have personally reviewed and interpreted outside labs performed by patient's PCP which showed LDL 51, HDL 42 on 12/24/2023 -Continue Crestor  40 mg daily with as needed refills  Splenic artery aneurysm -this is small on MRI and unchanged from 2019 per radiology -followed by [facility]>>she has not seen them in a while>>encouraged her to make a followup appt  DOE -ETT was normal in 2018 and 2021 -Coronary calcium  score 04/06/2020 was 0 -2D echo 08/04/2021 showed EF 60 to 65% with normal diastolic function -noncardiac and likely related to allergies  HTN -BP  goal < 130/46mmHg due to aortic dilatation -BP controlled on exam today -Continue Toprol  XL 25 mg daily with as needed refills  Followup with me in 1 year    Medication Adjustments/Labs and Tests Ordered: Current medicines are reviewed at length with the patient today.  Concerns regarding medicines are outlined above.  No orders of the defined types were placed in this encounter.  Meds ordered this encounter  Medications   rosuvastatin  (CRESTOR ) 40 MG tablet    Sig: Take 1 tablet (40 mg total) by mouth daily.    Dispense:  90 tablet    Refill:  3    Appointment needed for additional refills. Thank you.    Signed, Wilbert Bihari, MD  04/06/2024 10:09 AM    [facility]

## 2024-04-06 ENCOUNTER — Ambulatory Visit: Attending: Cardiology | Admitting: Cardiology

## 2024-04-06 ENCOUNTER — Encounter: Payer: Self-pay | Admitting: Cardiology

## 2024-04-06 ENCOUNTER — Telehealth: Payer: Self-pay | Admitting: *Deleted

## 2024-04-06 VITALS — BP 112/86 | HR 67 | Ht 62.0 in | Wt 139.4 lb

## 2024-04-06 DIAGNOSIS — R0609 Other forms of dyspnea: Secondary | ICD-10-CM

## 2024-04-06 DIAGNOSIS — G4733 Obstructive sleep apnea (adult) (pediatric): Secondary | ICD-10-CM

## 2024-04-06 DIAGNOSIS — E7800 Pure hypercholesterolemia, unspecified: Secondary | ICD-10-CM | POA: Diagnosis not present

## 2024-04-06 DIAGNOSIS — I10 Essential (primary) hypertension: Secondary | ICD-10-CM

## 2024-04-06 DIAGNOSIS — I491 Atrial premature depolarization: Secondary | ICD-10-CM

## 2024-04-06 DIAGNOSIS — I77810 Thoracic aortic ectasia: Secondary | ICD-10-CM | POA: Diagnosis not present

## 2024-04-06 DIAGNOSIS — I728 Aneurysm of other specified arteries: Secondary | ICD-10-CM

## 2024-04-06 MED ORDER — ROSUVASTATIN CALCIUM 40 MG PO TABS: 40.0000 mg | ORAL_TABLET | Freq: Every day | ORAL | 3 refills | Status: AC

## 2024-04-06 NOTE — Telephone Encounter (Signed)
 Order sent to [facility] to order new supplies via community message.

## 2024-04-06 NOTE — Patient Instructions (Signed)
 Medication Instructions:  Your physician recommends that you continue on your current medications as directed. Please refer to the Current Medication list given to you today.  *If you need a refill on your cardiac medications before your next appointment, please call your pharmacy*  Lab Work: None.  If you have labs (blood work) drawn today and your tests are completely normal, you will receive your results only by: MyChart Message (if you have MyChart) OR A paper copy in the mail If you have any lab test that is abnormal or we need to change your treatment, we will call you to review the results.  Testing/Procedures: Please complete your cardiac MRI in August of 2026.   Follow-Up: At [facility], you and your health needs are our priority.  As part of our continuing mission to provide you with exceptional heart care, our providers are all part of one team.  This team includes your primary Cardiologist (physician) and Advanced Practice Providers or APPs (Physician Assistants and Nurse Practitioners) who all work together to provide you with the care you need, when you need it.  Your next appointment:   1 year(s)  Provider:   Wilbert Bihari, MD

## 2024-04-06 NOTE — Telephone Encounter (Signed)
-----   Message from Wilbert Bihari sent at 04/06/2024 10:09 AM EST ----- Needs to be set up with [facility] and order new PAP supplies

## 2024-04-21 ENCOUNTER — Other Ambulatory Visit: Payer: Self-pay | Admitting: Cardiology

## 2024-04-25 ENCOUNTER — Ambulatory Visit

## 2024-04-28 ENCOUNTER — Ambulatory Visit

## 2024-04-28 DIAGNOSIS — L501 Idiopathic urticaria: Secondary | ICD-10-CM

## 2024-05-26 ENCOUNTER — Ambulatory Visit

## 2024-05-27 ENCOUNTER — Other Ambulatory Visit: Payer: Self-pay | Admitting: Family Medicine

## 2024-05-30 ENCOUNTER — Ambulatory Visit

## 2024-05-30 DIAGNOSIS — L501 Idiopathic urticaria: Secondary | ICD-10-CM | POA: Diagnosis not present

## 2024-06-20 ENCOUNTER — Encounter: Payer: Self-pay | Admitting: Allergy & Immunology

## 2024-06-20 ENCOUNTER — Ambulatory Visit: Payer: 59 | Admitting: Allergy & Immunology

## 2024-06-20 ENCOUNTER — Other Ambulatory Visit: Payer: Self-pay

## 2024-06-20 VITALS — BP 110/82 | HR 59 | Temp 97.2°F | Ht 61.5 in | Wt 137.0 lb

## 2024-06-20 DIAGNOSIS — L501 Idiopathic urticaria: Secondary | ICD-10-CM | POA: Diagnosis not present

## 2024-06-20 DIAGNOSIS — L239 Allergic contact dermatitis, unspecified cause: Secondary | ICD-10-CM

## 2024-06-20 DIAGNOSIS — J301 Allergic rhinitis due to pollen: Secondary | ICD-10-CM | POA: Diagnosis not present

## 2024-06-27 ENCOUNTER — Ambulatory Visit

## 2024-06-28 ENCOUNTER — Ambulatory Visit: Admitting: Family Medicine

## 2025-01-30 ENCOUNTER — Ambulatory Visit: Admitting: Rheumatology

## 2025-06-21 ENCOUNTER — Ambulatory Visit: Admitting: Allergy & Immunology
